# Patient Record
Sex: Female | Born: 1935 | Race: White | Hispanic: No | Smoking: Never smoker
Health system: Southern US, Community
[De-identification: ages and names within clinical notes are randomized; demographics above are authoritative.]

## PROBLEM LIST (undated history)

## (undated) DIAGNOSIS — I442 Atrioventricular block, complete: Secondary | ICD-10-CM

## (undated) DIAGNOSIS — N390 Urinary tract infection, site not specified: Secondary | ICD-10-CM

## (undated) DIAGNOSIS — I2510 Atherosclerotic heart disease of native coronary artery without angina pectoris: Secondary | ICD-10-CM

## (undated) DIAGNOSIS — E0590 Thyrotoxicosis, unspecified without thyrotoxic crisis or storm: Secondary | ICD-10-CM

## (undated) DIAGNOSIS — B029 Zoster without complications: Secondary | ICD-10-CM

## (undated) DIAGNOSIS — C569 Malignant neoplasm of unspecified ovary: Secondary | ICD-10-CM

## (undated) DIAGNOSIS — N189 Chronic kidney disease, unspecified: Secondary | ICD-10-CM

## (undated) DIAGNOSIS — E039 Hypothyroidism, unspecified: Secondary | ICD-10-CM

## (undated) HISTORY — PX: BLADDER REPAIR: SHX76

## (undated) HISTORY — PX: PARTIAL HYSTERECTOMY: SHX80

## (undated) HISTORY — PX: OTHER SURGICAL HISTORY: SHX169

## (undated) HISTORY — PX: ABDOMINAL HYSTERECTOMY: SHX81

## (undated) HISTORY — DX: Urinary tract infection, site not specified: N39.0

## (undated) HISTORY — DX: Chronic kidney disease, unspecified: N18.9

## (undated) HISTORY — DX: Hypothyroidism, unspecified: E03.9

## (undated) HISTORY — DX: Zoster without complications: B02.9

## (undated) HISTORY — DX: Atherosclerotic heart disease of native coronary artery without angina pectoris: I25.10

---

## 1999-02-20 HISTORY — PX: CORONARY ANGIOPLASTY WITH STENT PLACEMENT: SHX49

## 2012-02-20 DIAGNOSIS — C569 Malignant neoplasm of unspecified ovary: Secondary | ICD-10-CM

## 2012-02-20 HISTORY — DX: Malignant neoplasm of unspecified ovary: C56.9

## 2015-10-09 ENCOUNTER — Encounter: Payer: Self-pay | Admitting: Emergency Medicine

## 2015-10-09 ENCOUNTER — Observation Stay
Admission: EM | Admit: 2015-10-09 | Discharge: 2015-10-13 | Disposition: A | Discharge status: Home / Self Care | Payer: Commercial Managed Care - HMO | Attending: Internal Medicine | Admitting: Internal Medicine

## 2015-10-09 DIAGNOSIS — Z8543 Personal history of malignant neoplasm of ovary: Secondary | ICD-10-CM | POA: Diagnosis not present

## 2015-10-09 DIAGNOSIS — R001 Bradycardia, unspecified: Secondary | ICD-10-CM | POA: Diagnosis not present

## 2015-10-09 DIAGNOSIS — E875 Hyperkalemia: Secondary | ICD-10-CM | POA: Diagnosis not present

## 2015-10-09 DIAGNOSIS — E785 Hyperlipidemia, unspecified: Secondary | ICD-10-CM | POA: Insufficient documentation

## 2015-10-09 DIAGNOSIS — Z880 Allergy status to penicillin: Secondary | ICD-10-CM | POA: Diagnosis not present

## 2015-10-09 DIAGNOSIS — E0590 Thyrotoxicosis, unspecified without thyrotoxic crisis or storm: Secondary | ICD-10-CM | POA: Diagnosis not present

## 2015-10-09 DIAGNOSIS — Z833 Family history of diabetes mellitus: Secondary | ICD-10-CM | POA: Insufficient documentation

## 2015-10-09 DIAGNOSIS — Z950 Presence of cardiac pacemaker: Secondary | ICD-10-CM

## 2015-10-09 DIAGNOSIS — R401 Stupor: Secondary | ICD-10-CM | POA: Diagnosis not present

## 2015-10-09 DIAGNOSIS — I4589 Other specified conduction disorders: Secondary | ICD-10-CM | POA: Insufficient documentation

## 2015-10-09 DIAGNOSIS — I442 Atrioventricular block, complete: Principal | ICD-10-CM | POA: Diagnosis present

## 2015-10-09 DIAGNOSIS — Z87892 Personal history of anaphylaxis: Secondary | ICD-10-CM | POA: Insufficient documentation

## 2015-10-09 DIAGNOSIS — R55 Syncope and collapse: Secondary | ICD-10-CM

## 2015-10-09 DIAGNOSIS — E119 Type 2 diabetes mellitus without complications: Secondary | ICD-10-CM | POA: Insufficient documentation

## 2015-10-09 DIAGNOSIS — E039 Hypothyroidism, unspecified: Secondary | ICD-10-CM | POA: Diagnosis not present

## 2015-10-09 DIAGNOSIS — Z7982 Long term (current) use of aspirin: Secondary | ICD-10-CM | POA: Insufficient documentation

## 2015-10-09 DIAGNOSIS — Z888 Allergy status to other drugs, medicaments and biological substances status: Secondary | ICD-10-CM | POA: Insufficient documentation

## 2015-10-09 DIAGNOSIS — Z85038 Personal history of other malignant neoplasm of large intestine: Secondary | ICD-10-CM | POA: Diagnosis not present

## 2015-10-09 HISTORY — DX: Atrioventricular block, complete: I44.2

## 2015-10-09 HISTORY — DX: Thyrotoxicosis, unspecified without thyrotoxic crisis or storm: E05.90

## 2015-10-09 HISTORY — DX: Malignant neoplasm of unspecified ovary: C56.9

## 2015-10-09 LAB — CBC
HEMATOCRIT: 40.3 % (ref 35.0–47.0)
Hemoglobin: 13.6 g/dL (ref 12.0–16.0)
MCH: 31.3 pg (ref 26.0–34.0)
MCHC: 33.7 g/dL (ref 32.0–36.0)
MCV: 92.8 fL (ref 80.0–100.0)
Platelets: 207 10*3/uL (ref 150–440)
RBC: 4.34 MIL/uL (ref 3.80–5.20)
RDW: 13.6 % (ref 11.5–14.5)
WBC: 7.9 10*3/uL (ref 3.6–11.0)

## 2015-10-09 LAB — COMPREHENSIVE METABOLIC PANEL
ALK PHOS: 68 U/L (ref 38–126)
ALT: 51 U/L (ref 14–54)
AST: 41 U/L (ref 15–41)
Albumin: 3.7 g/dL (ref 3.5–5.0)
Anion gap: 5 (ref 5–15)
BUN: 18 mg/dL (ref 6–20)
CALCIUM: 9.1 mg/dL (ref 8.9–10.3)
CO2: 30 mmol/L (ref 22–32)
CREATININE: 0.82 mg/dL (ref 0.44–1.00)
Chloride: 107 mmol/L (ref 101–111)
Glucose, Bld: 110 mg/dL — ABNORMAL HIGH (ref 65–99)
Potassium: 5.2 mmol/L — ABNORMAL HIGH (ref 3.5–5.1)
Sodium: 142 mmol/L (ref 135–145)
Total Bilirubin: 0.8 mg/dL (ref 0.3–1.2)
Total Protein: 7.1 g/dL (ref 6.5–8.1)

## 2015-10-09 LAB — TSH: TSH: 0.324 u[IU]/mL — AB (ref 0.350–4.500)

## 2015-10-09 LAB — TROPONIN I

## 2015-10-09 LAB — MAGNESIUM: Magnesium: 2 mg/dL (ref 1.7–2.4)

## 2015-10-09 MED ORDER — SODIUM POLYSTYRENE SULFONATE 15 GM/60ML PO SUSP
ORAL | Status: AC
  Administered 2015-10-09: 30 g via ORAL
  Filled 2015-10-09: qty 60

## 2015-10-09 MED ORDER — HEPARIN SODIUM (PORCINE) 5000 UNIT/ML IJ SOLN
5000.0000 [IU] | Freq: Three times a day (TID) | INTRAMUSCULAR | Status: AC
  Administered 2015-10-09 – 2015-10-10 (×4): 5000 [IU] via SUBCUTANEOUS
  Filled 2015-10-09 (×3): qty 1

## 2015-10-09 MED ORDER — LEVOTHYROXINE SODIUM 88 MCG PO TABS
88.0000 ug | ORAL_TABLET | Freq: Every day | ORAL | Status: DC
  Administered 2015-10-09 – 2015-10-12 (×4): 88 ug via ORAL
  Filled 2015-10-09 (×4): qty 1

## 2015-10-09 MED ORDER — ADULT MULTIVITAMIN W/MINERALS CH
1.0000 | ORAL_TABLET | Freq: Every evening | ORAL | Status: DC
  Administered 2015-10-10 – 2015-10-12 (×3): 1 via ORAL
  Filled 2015-10-09 (×4): qty 1

## 2015-10-09 MED ORDER — SODIUM POLYSTYRENE SULFONATE 15 GM/60ML PO SUSP
30.0000 g | Freq: Once | ORAL | Status: AC
  Administered 2015-10-09: 30 g via ORAL
  Filled 2015-10-09: qty 120

## 2015-10-09 MED ORDER — SODIUM CHLORIDE 0.9% FLUSH
3.0000 mL | Freq: Two times a day (BID) | INTRAVENOUS | Status: DC
  Administered 2015-10-09 – 2015-10-13 (×8): 3 mL via INTRAVENOUS

## 2015-10-09 MED ORDER — ASPIRIN EC 81 MG PO TBEC
81.0000 mg | DELAYED_RELEASE_TABLET | Freq: Every day | ORAL | Status: DC
  Administered 2015-10-09 – 2015-10-12 (×4): 81 mg via ORAL
  Filled 2015-10-09 (×4): qty 1

## 2015-10-09 MED ORDER — ATORVASTATIN CALCIUM 10 MG PO TABS
10.0000 mg | ORAL_TABLET | Freq: Every day | ORAL | Status: DC
  Administered 2015-10-09 – 2015-10-12 (×4): 10 mg via ORAL
  Filled 2015-10-09 (×4): qty 1

## 2015-10-09 NOTE — ED Notes (Signed)
Admitting MD at bedside.

## 2015-10-09 NOTE — H&P (Signed)
[facility] [place] NAME: Natalie Stone    MR#:  [id number]  DATE OF BIRTH:  09-11-1926  DATE OF ADMISSION:  10/09/2015  PRIMARY CARE PHYSICIAN: Rusty Aus, MD   REQUESTING/REFERRING PHYSICIAN: [email or web address]  CHIEF COMPLAINT:   Chief Complaint  Patient presents with  . Bradycardia  . Near Syncope    HISTORY OF PRESENT ILLNESS: Natalie Stone  is a 82 y.o. female with a known history of Colon cancer history, hypothyroidism- was having some cramps in her legs and so started taking potassium supplements over-the-counter for last few days. Last 2-3 days she started feeling more weak and somewhat dizzy so decided to come to emergency room today. In ER she was noted to have bradycardia and third-degree AV block, so started on cardiac monitor and pacer as standby, and cardiologist made aware by ER physician about her admission. Currently during my exam she is in normal sinus rhythm with heart rate of 86.  PAST MEDICAL HISTORY:   Past Medical History:  Diagnosis Date  . Hyperthyroidism   . Ovarian cancer ([place]) 2014  . Third degree AV block ([place]) 10/09/2015    PAST SURGICAL HISTORY:  Past Surgical History:  Procedure Laterality Date  . ABDOMINAL HYSTERECTOMY    . BLADDER REPAIR    . PARTIAL HYSTERECTOMY      SOCIAL HISTORY:  Social History  Substance Use Topics  . Smoking status: Never Smoker  . Smokeless tobacco: Never Used  . Alcohol use No    FAMILY HISTORY:  Family History  Problem Relation Age of Onset  . Diabetes Brother     DRUG ALLERGIES:  Allergies  Allergen Reactions  . Penicillins Anaphylaxis    Has patient had a PCN reaction causing immediate rash, facial/tongue/throat swelling, SOB or lightheadedness with hypotension: Yes Has patient had a PCN reaction causing severe rash involving mucus membranes or skin necrosis: No Has patient had a PCN reaction that required hospitalization No Has patient had a PCN reaction  occurring within the last 10 years: No If all of the above answers are "NO", then may proceed with Cephalosporin use.    REVIEW OF SYSTEMS:   CONSTITUTIONAL: No fever,Positive for fatigue or weakness.  EYES: No blurred or double vision.  EARS, NOSE, AND THROAT: No tinnitus or ear pain.  RESPIRATORY: No cough, shortness of breath, wheezing or hemoptysis.  CARDIOVASCULAR: No chest pain, orthopnea, edema.  GASTROINTESTINAL: No nausea, vomiting, diarrhea or abdominal pain.  GENITOURINARY: No dysuria, hematuria.  ENDOCRINE: No polyuria, nocturia,  HEMATOLOGY: No anemia, easy bruising or bleeding SKIN: No rash or lesion. MUSCULOSKELETAL: No joint pain or arthritis.   NEUROLOGIC: No tingling, numbness, weakness.  PSYCHIATRY: No anxiety or depression.   MEDICATIONS AT HOME:  Prior to Admission medications   Medication Sig Start Date End Date Taking? Authorizing Provider  aspirin EC 81 MG tablet Take 81 mg by mouth at bedtime.   Yes Historical Provider, MD  atorvastatin (LIPITOR) 20 MG tablet Take 10 mg by mouth at bedtime.   Yes Historical Provider, MD  glucosamine-chondroitin 500-400 MG tablet Take 1 tablet by mouth 3 (three) times daily.   Yes Historical Provider, MD  levothyroxine (SYNTHROID, LEVOTHROID) 88 MCG tablet Take 88 mcg by mouth at bedtime.    Yes Historical Provider, MD  Multiple Vitamin (MULTIVITAMIN) tablet Take 1 tablet by mouth every evening.   Yes Historical Provider, MD      PHYSICAL EXAMINATION:   VITAL SIGNS: Blood pressure Marland Kitchen)  148/51, pulse (!) 41, temperature 98.5 F (36.9 C), temperature source Oral, resp. rate (!) 21, height 5\' 2"  (1.575 m), weight 54.4 kg (120 lb), SpO2 98 %.  GENERAL:  82 y.o.-year-old patient lying in the bed with no acute distress.  EYES: Pupils equal, round, reactive to light and accommodation. No scleral icterus. Extraocular muscles intact.  HEENT: Head atraumatic, normocephalic. Oropharynx and nasopharynx clear.  NECK:  Supple, no  jugular venous distention. No thyroid enlargement, no tenderness.  LUNGS: Normal breath sounds bilaterally, no wheezing, rales,rhonchi or crepitation. No use of accessory muscles of respiration.  CARDIOVASCULAR: S1, S2 normal. No murmurs, rubs, or gallops.  ABDOMEN: Soft, nontender, nondistended. Bowel sounds present. No organomegaly or mass.  EXTREMITIES: No pedal edema, cyanosis, or clubbing.  NEUROLOGIC: Cranial nerves II through XII are intact. Muscle strength 5/5 in all extremities. Sensation intact. Gait not checked.  PSYCHIATRIC: The patient is alert and oriented x 3.  SKIN: No obvious rash, lesion, or ulcer.   LABORATORY PANEL:   CBC  Recent Labs Lab 10/09/15 1935  WBC 7.9  HGB 13.6  HCT 40.3  PLT 207  MCV 92.8  MCH 31.3  MCHC 33.7  RDW 13.6   ------------------------------------------------------------------------------------------------------------------  Chemistries   Recent Labs Lab 10/09/15 1935  NA 142  K 5.2*  CL 107  CO2 30  GLUCOSE 110*  BUN 18  CREATININE 0.82  CALCIUM 9.1  AST 41  ALT 51  ALKPHOS 68  BILITOT 0.8   ------------------------------------------------------------------------------------------------------------------ estimated creatinine clearance is 38.9 mL/min (by C-G formula based on SCr of 0.82 mg/dL). ------------------------------------------------------------------------------------------------------------------ No results for input(s): TSH, T4TOTAL, T3FREE, THYROIDAB in the last 72 hours.  Invalid input(s): FREET3   Coagulation profile No results for input(s): INR, PROTIME in the last 168 hours. ------------------------------------------------------------------------------------------------------------------- No results for input(s): DDIMER in the last 72 hours. -------------------------------------------------------------------------------------------------------------------  Cardiac Enzymes  Recent Labs Lab  10/09/15 1935  TROPONINI <0.03   ------------------------------------------------------------------------------------------------------------------ Invalid input(s): POCBNP  ---------------------------------------------------------------------------------------------------------------  Urinalysis No results found for: COLORURINE, APPEARANCEUR, LABSPEC, PHURINE, GLUCOSEU, HGBUR, BILIRUBINUR, KETONESUR, PROTEINUR, UROBILINOGEN, NITRITE, LEUKOCYTESUR   RADIOLOGY: No results found.  EKG: Orders placed or performed during the hospital encounter of 10/09/15  . EKG 12-Lead  . EKG 12-Lead  12-lead EKG in ER shows complete third degree AV block.  IMPRESSION AND PLAN: * Third-degree AV block with bradycardia and symptoms of dizziness   Potassium level is slightly high, giving 1 dose of Kayexalate, checked tomorrow morning.   Counseled her not to take potassium supplement without seeking advice from her primary care physician.    She is on thyroid supplement last time TSH was checked in March 2017, check TSH here.  * Hypothyroidism   Continue Synthroid supplement for now, check TSH.  * Hyperlipidemia   Continue atorvastatin.  All the records are reviewed and case discussed with ED provider. Management plans discussed with the patient, family and they are in agreement.  CODE STATUS: Full code Code Status History    This patient does not have a recorded code status. Please follow your organizational policy for patients in this situation.    Advance Directive Documentation   Flowsheet Row Most Recent Value  Type of Advance Directive  Healthcare Power of Attorney, Living will, Out of facility DNR (pink MOST or yellow form)  Pre-existing out of facility DNR order (yellow form or pink MOST form)  No data  "MOST" Form in Place?  No data      Patient's husband and granddaughter are present in  the room during my visit. TOTAL TIME TAKING CARE OF THIS PATIENT: 50 minutes.    Vaughan Basta M.D on 10/09/2015   Between 7am to 6pm - Pager - [phone]  After 6pm go to [email or web address] - password [email or web address] [facility]  Office  [phone]  CC: Primary care physician; Rusty Aus, MD   Note: This dictation was prepared with Dragon dictation along with smaller phrase technology. Any transcriptional errors that result from this process are unintentional.

## 2015-10-09 NOTE — Care Management Obs Status (Signed)
[place] NOTIFICATION   Patient Details  Name: Natalie Stone MRN: [id number] Date of Birth: 1927-02-02   Medicare Observation Status Notification Given:   Yes    Beau Fanny, RN 10/09/2015, 9:09 PM

## 2015-10-09 NOTE — ED Triage Notes (Signed)
PER [facility]: Pt. Started feeling weak 2 days ago, MD at assisted living assessed and advised pt. To intake more PO fluids. Today pt. Reports near syncope so called 911. EMS reports 2nd degree, type 2 heart block with a heart rate averaging about 43 bpm with some PVCs.

## 2015-10-09 NOTE — ED Provider Notes (Signed)
[facility] Emergency Department Provider Note  Time seen: 6:41 PM  I have reviewed the triage vital signs and the nursing notes.   HISTORY  Chief Complaint Bradycardia and Near Syncope    HPI Natalie Stone is a 82 y.o. female with a past medical history of hyperlipidemia and hypothyroidism who presents the emergency department with dizziness. According to the patient for the past one week she has been feeling dizzy and lightheaded at times. States she is almost passed out multiple times. Saw her doctor 2 days ago and was diagnosed with dehydration. States today she was very dizzy and lightheaded so she called EMS who brought her to the emergency department. Patient denies any chest pain, trouble breathing or nausea. States the lightheadedness will occasionally happen while lying down, or while walking. Patient noted to be bradycardic rhythm 40 bpm. On arrival to the emergency department.  Past Medical History:  Diagnosis Date  . Ovarian cancer ([place]) 2014    There are no active problems to display for this patient.   Past Surgical History:  Procedure Laterality Date  . ABDOMINAL HYSTERECTOMY    . BLADDER REPAIR    . PARTIAL HYSTERECTOMY      Prior to Admission medications   Medication Sig Start Date End Date Taking? Authorizing Provider  atorvastatin (LIPITOR) 10 MG tablet Take 10 mg by mouth daily.   Yes Historical Provider, MD  levothyroxine (SYNTHROID, LEVOTHROID) 88 MCG tablet Take 88 mcg by mouth daily before breakfast.   Yes Historical Provider, MD    Allergies  Allergen Reactions  . Penicillins Anaphylaxis    History reviewed. No pertinent family history.  Social History Social History  Substance Use Topics  . Smoking status: Never Smoker  . Smokeless tobacco: Never Used  . Alcohol use No    Review of Systems Constitutional: Negative for fever.Intermittent lightheadedness. Cardiovascular: Negative for chest pain. Respiratory: Negative  for shortness of breath. Gastrointestinal: Negative for abdominal pain Genitourinary: Negative for dysuria. Neurological: Negative for headache 10-point ROS otherwise negative.  ____________________________________________   PHYSICAL EXAM:  VITAL SIGNS: ED Triage Vitals  Enc Vitals Group     BP 10/09/15 1822 (!) 155/44     Pulse Rate 10/09/15 1822 (!) 41     Resp 10/09/15 1822 20     Temp 10/09/15 1822 98.5 F (36.9 C)     Temp Source 10/09/15 1822 Oral     SpO2 10/09/15 1822 99 %     Weight 10/09/15 1824 120 lb (54.4 kg)     Height 10/09/15 1824 5\' 2"  (1.575 m)     Head Circumference --      Peak Flow --      Pain Score --      Pain Loc --      Pain Edu? --      Excl. in [place]? --     Constitutional: Alert and oriented. Well appearing and in no distress. Eyes: Normal exam ENT   Head: Normocephalic and atraumatic.   Mouth/Throat: Mucous membranes are moist. Cardiovascular: Bradycardic rate, regular rhythm Respiratory: Normal respiratory effort without tachypnea nor retractions. Breath sounds are clear  Gastrointestinal: Soft and nontender. No distention.  Musculoskeletal: Nontender with normal range of motion in all extremities.  Neurologic:  Normal speech and language. No gross focal neurologic deficits  Skin:  Skin is warm, dry and intact.  Psychiatric: Mood and affect are normal. Speech and behavior are normal.   ____________________________________________    EKG  EKG reviewed  and interpreted by myself shows what appears to be consistent with complete heart block at 48 bpm, complete disassociation between P waves and QRS complexes. No concerning ST changes.  ____________________________________________   INITIAL IMPRESSION / ASSESSMENT AND PLAN / ED COURSE  Pertinent labs & imaging results that were available during my care of the patient were reviewed by me and considered in my medical decision making (see chart for details).  The patient presents  the emergency department intermittent lightheadedness, near syncope. Patient is quite bradycardic around 40 bpm, occasionally dropping to the upper 30s. Asymptomatic at this time with a stable/normal blood pressure. We will check an EKG, patient will likely require admission to the hospital versus transfer for further workup and cardiology evaluation. Patient placed on Zoll monitor.  I discussed the patient with Dr.Khan, he plans to place a permanent pacemaker tomorrow. As the patient is stable with normal blood pressure and a symptomatically at this time will admit to the hospitalist with cardiology consultation in the morning for likely pacemaker placement tomorrow.   CRITICAL CARE Performed by: Harvest Dark   Total critical care time: 30 minutes  Critical care time was exclusive of separately billable procedures and treating other patients.  Critical care was necessary to treat or prevent imminent or life-threatening deterioration.  Critical care was time spent personally by me on the following activities: development of treatment plan with patient and/or surrogate as well as nursing, discussions with consultants, evaluation of patient's response to treatment, examination of patient, obtaining history from patient or surrogate, ordering and performing treatments and interventions, ordering and review of laboratory studies, ordering and review of radiographic studies, pulse oximetry and re-evaluation of patient's condition.   ____________________________________________   FINAL CLINICAL IMPRESSION(S) / ED DIAGNOSES  Near-syncope  Bradycardia Complete heart block   Harvest Dark, MD 10/09/15 2019

## 2015-10-10 DIAGNOSIS — I455 Other specified heart block: Secondary | ICD-10-CM | POA: Diagnosis not present

## 2015-10-10 DIAGNOSIS — E875 Hyperkalemia: Secondary | ICD-10-CM | POA: Diagnosis not present

## 2015-10-10 DIAGNOSIS — E039 Hypothyroidism, unspecified: Secondary | ICD-10-CM | POA: Diagnosis not present

## 2015-10-10 DIAGNOSIS — R001 Bradycardia, unspecified: Secondary | ICD-10-CM | POA: Diagnosis not present

## 2015-10-10 DIAGNOSIS — I442 Atrioventricular block, complete: Secondary | ICD-10-CM | POA: Diagnosis not present

## 2015-10-10 LAB — BASIC METABOLIC PANEL
Anion gap: 5 (ref 5–15)
BUN: 14 mg/dL (ref 6–20)
CALCIUM: 8.5 mg/dL — AB (ref 8.9–10.3)
CHLORIDE: 108 mmol/L (ref 101–111)
CO2: 30 mmol/L (ref 22–32)
CREATININE: 0.49 mg/dL (ref 0.44–1.00)
Glucose, Bld: 95 mg/dL (ref 65–99)
Potassium: 3.6 mmol/L (ref 3.5–5.1)
SODIUM: 143 mmol/L (ref 135–145)

## 2015-10-10 LAB — CBC
HCT: 36.6 % (ref 35.0–47.0)
Hemoglobin: 12.6 g/dL (ref 12.0–16.0)
MCH: 31.6 pg (ref 26.0–34.0)
MCHC: 34.4 g/dL (ref 32.0–36.0)
MCV: 91.8 fL (ref 80.0–100.0)
PLATELETS: 189 10*3/uL (ref 150–440)
RBC: 3.99 MIL/uL (ref 3.80–5.20)
RDW: 13.7 % (ref 11.5–14.5)
WBC: 5.8 10*3/uL (ref 3.6–11.0)

## 2015-10-10 LAB — GLUCOSE, CAPILLARY: GLUCOSE-CAPILLARY: 165 mg/dL — AB (ref 65–99)

## 2015-10-10 LAB — MRSA PCR SCREENING: MRSA by PCR: NEGATIVE

## 2015-10-10 MED ORDER — HEPARIN SODIUM (PORCINE) 5000 UNIT/ML IJ SOLN
5000.0000 [IU] | Freq: Three times a day (TID) | INTRAMUSCULAR | Status: DC
  Administered 2015-10-11 – 2015-10-12 (×3): 5000 [IU] via SUBCUTANEOUS
  Filled 2015-10-10 (×3): qty 1

## 2015-10-10 NOTE — Plan of Care (Signed)
Problem: Safety: Goal: Ability to remain free from injury will improve Outcome: Progressing Fall precautions in place  Problem: Pain Managment: Goal: General experience of comfort will improve Outcome: Progressing Prn medications  Problem: Tissue Perfusion: Goal: Risk factors for ineffective tissue perfusion will decrease Outcome: Progressing SQ Heparin

## 2015-10-10 NOTE — Care Management Obs Status (Signed)
[place] NOTIFICATION   Patient Details  Name: Natalie Stone MRN: [id number] Date of Birth: [birth year]   Medicare Observation Status Notification Given:  Yes    Jolly Mango, RN 10/10/2015, 9:46 AM

## 2015-10-10 NOTE — Progress Notes (Signed)
Natalie Stone is a 82 y.o. female  [id number]  Primary Cardiologist: Neoma Laming Reason for Consultation: Third degree AV block  HPI: This 82 year old white female with a past medical history of diabetes and hyperlipidemia presented to the hospital with dizziness and presyncope. She did not have full-blown passing out spell. She was found to have third-degree AV block with A-V dissociation thus I was asked to evaluate the patient.   Review of Systems: No orthopnea PND or leg swelling   Past Medical History:  Diagnosis Date  . Hyperthyroidism   . Ovarian cancer ([place]) 2014  . Third degree AV block ([place]) 10/09/2015    Medications Prior to Admission  Medication Sig Dispense Refill  . aspirin EC 81 MG tablet Take 81 mg by mouth at bedtime.    Marland Kitchen atorvastatin (LIPITOR) 20 MG tablet Take 10 mg by mouth at bedtime.    Marland Kitchen glucosamine-chondroitin 500-400 MG tablet Take 1 tablet by mouth 3 (three) times daily.    Marland Kitchen levothyroxine (SYNTHROID, LEVOTHROID) 88 MCG tablet Take 88 mcg by mouth at bedtime.     . Multiple Vitamin (MULTIVITAMIN) tablet Take 1 tablet by mouth every evening.       Marland Kitchen aspirin EC  81 mg Oral QHS  . atorvastatin  10 mg Oral QHS  . heparin  5,000 Units Subcutaneous Q8H  . levothyroxine  88 mcg Oral QHS  . multivitamin with minerals  1 tablet Oral QPM  . sodium chloride flush  3 mL Intravenous Q12H    Infusions:    Allergies  Allergen Reactions  . Penicillins Anaphylaxis    Has patient had a PCN reaction causing immediate rash, facial/tongue/throat swelling, SOB or lightheadedness with hypotension: Yes Has patient had a PCN reaction causing severe rash involving mucus membranes or skin necrosis: No Has patient had a PCN reaction that required hospitalization No Has patient had a PCN reaction occurring within the last 10 years: No If all of the above answers are "NO", then may proceed with Cephalosporin use.    Social History   Social History  . Marital  status: Married    Spouse name: N/A  . Number of children: N/A  . Years of education: N/A   Occupational History  . Not on file.   Social History Main Topics  . Smoking status: Never Smoker  . Smokeless tobacco: Never Used  . Alcohol use No  . Drug use: No  . Sexual activity: Not on file   Other Topics Concern  . Not on file   Social History Narrative  . No narrative on file    Family History  Problem Relation Age of Onset  . Diabetes Brother     PHYSICAL EXAM: Vitals:   10/10/15 0543 10/10/15 0750  BP: (!) 125/52 (!) 149/51  Pulse: 70 72  Resp: 18   Temp: 98.7 F (37.1 C) 97.7 F (36.5 C)     Intake/Output Summary (Last 24 hours) at 10/10/15 0751 Last data filed at 10/10/15 0543  Gross per 24 hour  Intake                0 ml  Output              600 ml  Net             -600 ml    General:  Well appearing. No respiratory difficulty HEENT: normal Neck: supple. no JVD. Carotids 2+ bilat; no bruits. No lymphadenopathy or thryomegaly appreciated. Cor:  PMI nondisplaced. Regular rate & rhythm. No rubs, gallops or murmurs. Lungs: clear Abdomen: soft, nontender, nondistended. No hepatosplenomegaly. No bruits or masses. Good bowel sounds. Extremities: no cyanosis, clubbing, rash, edema Neuro: alert & oriented x 3, cranial nerves grossly intact. moves all 4 extremities w/o difficulty. Affect pleasant.  [id number] rhythm with A-V dissociation and ventricular rate 48 suggestive of third degree AV block  Results for orders placed or performed during the hospital encounter of 10/09/15 (from the past 24 hour(s))  CBC     Status: None   Collection Time: 10/09/15  7:35 PM  Result Value Ref Range   WBC 7.9 3.6 - 11.0 K/uL   RBC 4.34 3.80 - 5.20 MIL/uL   Hemoglobin 13.6 12.0 - 16.0 g/dL   HCT 40.3 35.0 - 47.0 %   MCV 92.8 80.0 - 100.0 fL   MCH 31.3 26.0 - 34.0 pg   MCHC 33.7 32.0 - 36.0 g/dL   RDW 13.6 11.5 - 14.5 %   Platelets 207 150 - 440 K/uL  Comprehensive  metabolic panel     Status: Abnormal   Collection Time: 10/09/15  7:35 PM  Result Value Ref Range   Sodium 142 135 - 145 mmol/L   Potassium 5.2 (H) 3.5 - 5.1 mmol/L   Chloride 107 101 - 111 mmol/L   CO2 30 22 - 32 mmol/L   Glucose, Bld 110 (H) 65 - 99 mg/dL   BUN 18 6 - 20 mg/dL   Creatinine, Ser 0.82 0.44 - 1.00 mg/dL   Calcium 9.1 8.9 - 10.3 mg/dL   Total Protein 7.1 6.5 - 8.1 g/dL   Albumin 3.7 3.5 - 5.0 g/dL   AST 41 15 - 41 U/L   ALT 51 14 - 54 U/L   Alkaline Phosphatase 68 38 - 126 U/L   Total Bilirubin 0.8 0.3 - 1.2 mg/dL   GFR calc non Af Amer >60 >60 mL/min   GFR calc Af Amer >60 >60 mL/min   Anion gap 5 5 - 15  Troponin I     Status: None   Collection Time: 10/09/15  7:35 PM  Result Value Ref Range   Troponin I <0.03 <0.03 ng/mL  TSH     Status: Abnormal   Collection Time: 10/09/15  7:35 PM  Result Value Ref Range   TSH 0.324 (L) 0.350 - 4.500 uIU/mL  Magnesium     Status: None   Collection Time: 10/09/15  7:35 PM  Result Value Ref Range   Magnesium 2.0 1.7 - 2.4 mg/dL  MRSA PCR Screening     Status: None   Collection Time: 10/09/15 11:22 PM  Result Value Ref Range   MRSA by PCR NEGATIVE NEGATIVE  Basic metabolic panel     Status: Abnormal   Collection Time: 10/10/15  5:43 AM  Result Value Ref Range   Sodium 143 135 - 145 mmol/L   Potassium 3.6 3.5 - 5.1 mmol/L   Chloride 108 101 - 111 mmol/L   CO2 30 22 - 32 mmol/L   Glucose, Bld 95 65 - 99 mg/dL   BUN 14 6 - 20 mg/dL   Creatinine, Ser 0.49 0.44 - 1.00 mg/dL   Calcium 8.5 (L) 8.9 - 10.3 mg/dL   GFR calc non Af Amer >60 >60 mL/min   GFR calc Af Amer >60 >60 mL/min   Anion gap 5 5 - 15  CBC     Status: None   Collection Time: 10/10/15  5:43 AM  Result Value  Ref Range   WBC 5.8 3.6 - 11.0 K/uL   RBC 3.99 3.80 - 5.20 MIL/uL   Hemoglobin 12.6 12.0 - 16.0 g/dL   HCT 36.6 35.0 - 47.0 %   MCV 91.8 80.0 - 100.0 fL   MCH 31.6 26.0 - 34.0 pg   MCHC 34.4 32.0 - 36.0 g/dL   RDW 13.7 11.5 - 14.5 %   Platelets  189 150 - 440 K/uL   No results found.   ASSESSMENT AND PLAN:Complete AV block with A-V dissociation and presyncope but clinically stable and hemodynamically stable with no chest pain or shortness of breath or dizziness lying down. No need for temporary pacemaker in. I spoke to Dr. Maris Berger and he has agreed to put a permanent pacemaker in.  KHAN,SHAUKAT A

## 2015-10-10 NOTE — Progress Notes (Signed)
Central tele called this RN to inform of patient's episodes of ventricular standstill. Episodes of over 3 second pauses. Dr. Estanislado Pandy notified.

## 2015-10-10 NOTE — Progress Notes (Signed)
Pt had 4.5 sec and 7.09 sec pause, pt asymptomatic at this time.  Dr. Humphrey Rolls made aware, asked me to page Dr. Josefa Half.

## 2015-10-10 NOTE — Progress Notes (Signed)
Dr. Saralyn Pilar made aware of pauses, no new orders received.

## 2015-10-10 NOTE — Progress Notes (Signed)
[place] [facility] [place] NAME: Natalie Stone    MR#:  [id number]  DATE OF BIRTH:  1926-03-20  SUBJECTIVE:    REVIEW OF SYSTEMS:   ROS Tolerating Diet: Tolerating PT:   DRUG ALLERGIES:   Allergies  Allergen Reactions  . Penicillins Anaphylaxis    Has patient had a PCN reaction causing immediate rash, facial/tongue/throat swelling, SOB or lightheadedness with hypotension: Yes Has patient had a PCN reaction causing severe rash involving mucus membranes or skin necrosis: No Has patient had a PCN reaction that required hospitalization No Has patient had a PCN reaction occurring within the last 10 years: No If all of the above answers are "NO", then may proceed with Cephalosporin use.    VITALS:  Blood pressure (!) 126/40, pulse 69, temperature 98.1 F (36.7 C), temperature source Oral, resp. rate 18, height 5\' 2"  (1.575 m), weight 117 lb 3.2 oz (53.2 kg), SpO2 97 %.  PHYSICAL EXAMINATION:   Physical Exam  GENERAL:  82 y.o.-year-old patient lying in the bed with no acute distress.  EYES: Pupils equal, round, reactive to light and accommodation. No scleral icterus. Extraocular muscles intact.  HEENT: Head atraumatic, normocephalic. Oropharynx and nasopharynx clear.  NECK:  Supple, no jugular venous distention. No thyroid enlargement, no tenderness.  LUNGS: Normal breath sounds bilaterally, no wheezing, rales, rhonchi. No use of accessory muscles of respiration.  CARDIOVASCULAR: S1, S2 normal. No murmurs, rubs, or gallops.  ABDOMEN: Soft, nontender, nondistended. Bowel sounds present. No organomegaly or mass.  EXTREMITIES: No cyanosis, clubbing or edema b/l.    NEUROLOGIC: Cranial nerves II through XII are intact. No focal Motor or sensory deficits b/l.   PSYCHIATRIC:  patient is alert and oriented x 3.  SKIN: No obvious rash, lesion, or ulcer.   LABORATORY PANEL:  CBC  Recent Labs Lab 10/10/15 0543  WBC 5.8  HGB 12.6  HCT 36.6   PLT 189    Chemistries   Recent Labs Lab 10/09/15 1935 10/10/15 0543  NA 142 143  K 5.2* 3.6  CL 107 108  CO2 30 30  GLUCOSE 110* 95  BUN 18 14  CREATININE 0.82 0.49  CALCIUM 9.1 8.5*  MG 2.0  --   AST 41  --   ALT 51  --   ALKPHOS 68  --   BILITOT 0.8  --    Cardiac Enzymes  Recent Labs Lab 10/09/15 1935  TROPONINI <0.03   RADIOLOGY:  No results found. ASSESSMENT AND PLAN:  Natalie Stone  is a 82 y.o. female with a known history of Colon cancer history, hypothyroidism- was having some cramps in her legs and so started taking potassium supplements over-the-counter for last few days. In ER she was noted to have bradycardia and third-degree AV block, so started on cardiac monitor and pacer as standby  * Third-degree AV block with bradycardia and symptoms of dizziness -pt is asymptomatic  -Pacemaker implant tomorrow by dr Josefa Half  * Hypothyroidism   Continue Synthroid supplement   * Hyperlipidemia   Continue atorvastatin.  Case discussed with Care Management/Social Worker. Management plans discussed with the patient, family and they are in agreement.  CODE STATUS:FULL DVT Prophylaxis: Lovenox TOTAL TIME TAKING CARE OF THIS PATIENT:30 minutes.  >50% time spent on counselling and coordination of care  POSSIBLE D/C IN 1-2 DAYS, DEPENDING ON CLINICAL CONDITION.  Note: This dictation was prepared with Dragon dictation along with smaller phrase technology. Any transcriptional errors that result from this  process are unintentional.  Taniaya Rudder M.D on 10/10/2015 at 2:15 PM  Between 7am to 6pm - Pager - [phone]  After 6pm go to [email or web address] - password [email or web address] [facility]  Office  [phone]  CC: Primary care physician; Rusty Aus, MD

## 2015-10-10 NOTE — Progress Notes (Signed)
A&O. Up with standby assist. Admitted from home with dizziness. Found to be in 3rd degree heart block. Now SR at 45. Pacer pads in place. For possible pacemaker placement today.

## 2015-10-10 NOTE — Progress Notes (Signed)
Has 4 sec pause, asymptomatic thus doesnot need temporary pacemaker and has been like this for weeks. She is having PPM tomorrow.

## 2015-10-10 NOTE — Care Management (Signed)
Observation patient. From home with her spouse. Met with patient. She is hard of hearing.  Admitted with 3rd degree block. For pacemaker 08/22. Current with PCP Dr. Sabra Heck. Denies issues obtaining medications, copays or transportation. No home O2 or home health. No discharge needs anticipated at this time.

## 2015-10-10 NOTE — Progress Notes (Signed)
[place] at [place] NAME: Natalie Stone    MR#:  [id number]  DATE OF BIRTH:  08-19-1926  SUBJECTIVE:   Came in after feeling dizzy and light headed Found to have AV block REVIEW OF SYSTEMS:   Review of Systems  Constitutional: Negative for chills, fever and weight loss.  HENT: Negative for ear discharge, ear pain and nosebleeds.   Eyes: Negative for blurred vision, pain and discharge.  Respiratory: Negative for sputum production, shortness of breath, wheezing and stridor.   Cardiovascular: Negative for chest pain, palpitations, orthopnea and PND.  Gastrointestinal: Negative for abdominal pain, diarrhea, nausea and vomiting.  Genitourinary: Negative for frequency and urgency.  Musculoskeletal: Negative for back pain and joint pain.  Neurological: Positive for dizziness. Negative for sensory change, speech change, focal weakness and weakness.  Psychiatric/Behavioral: Negative for depression and hallucinations. The patient is not nervous/anxious.    Tolerating Diet:yes Tolerating PT: pending  DRUG ALLERGIES:   Allergies  Allergen Reactions  . Penicillins Anaphylaxis    Has patient had a PCN reaction causing immediate rash, facial/tongue/throat swelling, SOB or lightheadedness with hypotension: Yes Has patient had a PCN reaction causing severe rash involving mucus membranes or skin necrosis: No Has patient had a PCN reaction that required hospitalization No Has patient had a PCN reaction occurring within the last 10 years: No If all of the above answers are "NO", then may proceed with Cephalosporin use.    VITALS:  Blood pressure (!) 126/40, pulse 69, temperature 98.1 F (36.7 C), temperature source Oral, resp. rate 18, height 5\' 2"  (1.575 m), weight 117 lb 3.2 oz (53.2 kg), SpO2 97 %.  PHYSICAL EXAMINATION:   Physical Exam  GENERAL:  82 y.o.-year-old patient lying in the bed with no acute distress.  EYES: Pupils equal,  round, reactive to light and accommodation. No scleral icterus. Extraocular muscles intact.  HEENT: Head atraumatic, normocephalic. Oropharynx and nasopharynx clear.  NECK:  Supple, no jugular venous distention. No thyroid enlargement, no tenderness.  LUNGS: Normal breath sounds bilaterally, no wheezing, rales, rhonchi. No use of accessory muscles of respiration.  CARDIOVASCULAR: S1, S2 normal. No murmurs, rubs, or gallops.  ABDOMEN: Soft, nontender, nondistended. Bowel sounds present. No organomegaly or mass.  EXTREMITIES: No cyanosis, clubbing or edema b/l.    NEUROLOGIC: Cranial nerves II through XII are intact. No focal Motor or sensory deficits b/l.   PSYCHIATRIC:  patient is alert and oriented x 3.  SKIN: No obvious rash, lesion, or ulcer.   LABORATORY PANEL:  CBC  Recent Labs Lab 10/10/15 0543  WBC 5.8  HGB 12.6  HCT 36.6  PLT 189    Chemistries   Recent Labs Lab 10/09/15 1935 10/10/15 0543  NA 142 143  K 5.2* 3.6  CL 107 108  CO2 30 30  GLUCOSE 110* 95  BUN 18 14  CREATININE 0.82 0.49  CALCIUM 9.1 8.5*  MG 2.0  --   AST 41  --   ALT 51  --   ALKPHOS 68  --   BILITOT 0.8  --    Cardiac Enzymes  Recent Labs Lab 10/09/15 1935  TROPONINI <0.03   RADIOLOGY:  No results found. ASSESSMENT AND PLAN:  Natalie Stone  is a 82 y.o. female with a known history of Colon cancer history, hypothyroidism- was having some cramps in her legs and so started taking potassium supplements over-the-counter for last few days. In ER she was noted to have bradycardia and third-degree  AV block, so started on cardiac monitor and pacer as standby  * Third-degree AV block with bradycardia and symptoms of dizziness -pt is asymptomatic  -Pacemaker implant tomorrow by dr Josefa Half  * Hypothyroidism   Continue Synthroid supplement   * Hyperlipidemia   Continue atorvastatin.  Case discussed with Care Management/Social Worker. Management plans discussed with the patient, family  and they are in agreement.  CODE STATUS:FULL DVT Prophylaxis: Lovenox TOTAL TIME TAKING CARE OF THIS PATIENT:30 minutes.  >50% time spent on counselling and coordination of care  POSSIBLE D/C IN 1-2 DAYS, DEPENDING ON CLINICAL CONDITION.  Note: This dictation was prepared with Dragon dictation along with smaller phrase technology. Any transcriptional errors that result from this process are unintentional.  Joniel Graumann M.D on 10/10/2015 at 2:17 PM  Between 7am to 6pm - Pager - [phone]  After 6pm go to [email or web address] - password [email or web address] [facility]  Office  [phone]  CC: Primary care physician; Rusty Aus, MD

## 2015-10-11 ENCOUNTER — Observation Stay: Payer: Commercial Managed Care - HMO

## 2015-10-11 ENCOUNTER — Encounter
Admission: EM | Disposition: A | Discharge status: Home / Self Care | Payer: Self-pay | Source: Home / Self Care | Attending: Emergency Medicine

## 2015-10-11 ENCOUNTER — Encounter: Payer: Self-pay | Admitting: *Deleted

## 2015-10-11 ENCOUNTER — Observation Stay: Payer: Commercial Managed Care - HMO | Admitting: Anesthesiology

## 2015-10-11 DIAGNOSIS — R001 Bradycardia, unspecified: Secondary | ICD-10-CM | POA: Diagnosis not present

## 2015-10-11 DIAGNOSIS — E875 Hyperkalemia: Secondary | ICD-10-CM | POA: Diagnosis not present

## 2015-10-11 DIAGNOSIS — Z950 Presence of cardiac pacemaker: Secondary | ICD-10-CM | POA: Diagnosis not present

## 2015-10-11 DIAGNOSIS — I442 Atrioventricular block, complete: Secondary | ICD-10-CM | POA: Diagnosis not present

## 2015-10-11 DIAGNOSIS — I441 Atrioventricular block, second degree: Secondary | ICD-10-CM | POA: Diagnosis not present

## 2015-10-11 DIAGNOSIS — I495 Sick sinus syndrome: Secondary | ICD-10-CM | POA: Diagnosis not present

## 2015-10-11 DIAGNOSIS — E039 Hypothyroidism, unspecified: Secondary | ICD-10-CM | POA: Diagnosis not present

## 2015-10-11 DIAGNOSIS — I455 Other specified heart block: Secondary | ICD-10-CM | POA: Diagnosis not present

## 2015-10-11 HISTORY — PX: PACEMAKER INSERTION: SHX728

## 2015-10-11 SURGERY — INSERTION, CARDIAC PACEMAKER
Anesthesia: General

## 2015-10-11 MED ORDER — FENTANYL CITRATE (PF) 100 MCG/2ML IJ SOLN: 25.0000 ug | INTRAMUSCULAR | Status: DC | PRN

## 2015-10-11 MED ORDER — SODIUM CHLORIDE 0.9 % IR SOLN
Status: DC | PRN
  Administered 2015-10-11: 240 mL

## 2015-10-11 MED ORDER — ACETAMINOPHEN 325 MG PO TABS: 325.0000 mg | ORAL_TABLET | ORAL | Status: DC | PRN

## 2015-10-11 MED ORDER — VANCOMYCIN HCL IN DEXTROSE 1-5 GM/200ML-% IV SOLN
1000.0000 mg | Freq: Two times a day (BID) | INTRAVENOUS | Status: AC
  Administered 2015-10-12: 1000 mg via INTRAVENOUS
  Filled 2015-10-11: qty 200

## 2015-10-11 MED ORDER — PROPOFOL 10 MG/ML IV BOLUS
INTRAVENOUS | Status: DC | PRN
  Administered 2015-10-11: 10 mg via INTRAVENOUS
  Administered 2015-10-11: 40 mg via INTRAVENOUS
  Administered 2015-10-11 (×2): 10 mg via INTRAVENOUS

## 2015-10-11 MED ORDER — GENTAMICIN SULFATE 40 MG/ML IJ SOLN
80.0000 mg | INTRAMUSCULAR | Status: DC
  Filled 2015-10-11: qty 2

## 2015-10-11 MED ORDER — LACTATED RINGERS IV SOLN
INTRAVENOUS | Status: DC | PRN
  Administered 2015-10-11: 14:00:00 via INTRAVENOUS

## 2015-10-11 MED ORDER — SODIUM CHLORIDE 0.9 % IV SOLN
INTRAVENOUS | Status: DC
  Administered 2015-10-11: 13:00:00 via INTRAVENOUS

## 2015-10-11 MED ORDER — SODIUM CHLORIDE FLUSH 0.9 % IV SOLN
INTRAVENOUS | Status: AC
  Administered 2015-10-11: 3 mL
  Filled 2015-10-11: qty 10

## 2015-10-11 MED ORDER — VANCOMYCIN HCL IN DEXTROSE 1-5 GM/200ML-% IV SOLN
1000.0000 mg | INTRAVENOUS | Status: AC
  Administered 2015-10-11: 1000 mg via INTRAVENOUS

## 2015-10-11 MED ORDER — ONDANSETRON HCL 4 MG/2ML IJ SOLN: 4.0000 mg | Freq: Four times a day (QID) | INTRAMUSCULAR | Status: DC | PRN

## 2015-10-11 MED ORDER — LIDOCAINE 1 % OPTIME INJ - NO CHARGE
INTRAMUSCULAR | Status: DC | PRN
  Administered 2015-10-11: 28 mL

## 2015-10-11 MED ORDER — SODIUM CHLORIDE 0.9 % IJ SOLN
INTRAMUSCULAR | Status: DC | PRN
  Administered 2015-10-11: 6 mL via INTRAVENOUS

## 2015-10-11 SURGICAL SUPPLY — 35 items
BAG DECANTER FOR FLEXI CONT (MISCELLANEOUS) ×2 IMPLANT
BRUSH SCRUB 4% CHG (MISCELLANEOUS) ×2 IMPLANT
CABLE SURG 12 DISP A/V CHANNEL (MISCELLANEOUS) ×4 IMPLANT
CANISTER SUCT 1200ML W/VALVE (MISCELLANEOUS) ×2 IMPLANT
CHLORAPREP W/TINT 26ML (MISCELLANEOUS) ×2 IMPLANT
COVER LIGHT HANDLE STERIS (MISCELLANEOUS) ×4 IMPLANT
COVER MAYO STAND STRL (DRAPES) ×2 IMPLANT
DRAPE C-ARM XRAY 36X54 (DRAPES) ×2 IMPLANT
DRESSING TELFA 4X3 1S ST N-ADH (GAUZE/BANDAGES/DRESSINGS) ×2 IMPLANT
DRSG TEGADERM 4X4.75 (GAUZE/BANDAGES/DRESSINGS) ×2 IMPLANT
ELECT REM PT RETURN 9FT ADLT (ELECTROSURGICAL) ×2
ELECTRODE REM PT RTRN 9FT ADLT (ELECTROSURGICAL) ×1 IMPLANT
GLOVE BIO SURGEON STRL SZ7.5 (GLOVE) ×4 IMPLANT
GLOVE BIO SURGEON STRL SZ8 (GLOVE) ×4 IMPLANT
GOWN STRL REUS W/ TWL LRG LVL3 (GOWN DISPOSABLE) ×1 IMPLANT
GOWN STRL REUS W/ TWL XL LVL3 (GOWN DISPOSABLE) ×1 IMPLANT
GOWN STRL REUS W/TWL LRG LVL3 (GOWN DISPOSABLE) ×1
GOWN STRL REUS W/TWL XL LVL3 (GOWN DISPOSABLE) ×1
IMMOBILIZER SHDR MD LX WHT (SOFTGOODS) ×2 IMPLANT
IMMOBILIZER SHDR XL LX WHT (SOFTGOODS) IMPLANT
INTRO PACEMAKR LEAD 9FR 13CM (INTRODUCER) ×2
INTRO PACEMKR SHEATH II 7FR (MISCELLANEOUS) ×4
INTRODUCER PACEMKR LD 9FR 13CM (INTRODUCER) ×1 IMPLANT
INTRODUCER PACEMKR SHTH II 7FR (MISCELLANEOUS) ×2 IMPLANT
IV NS 500ML (IV SOLUTION) ×1
IV NS 500ML BAXH (IV SOLUTION) ×1 IMPLANT
KIT RM TURNOVER STRD PROC AR (KITS) ×2 IMPLANT
LABEL OR SOLS (LABEL) ×2 IMPLANT
LEAD CAPSURE NOVUS 5076-52CM (Lead) ×2 IMPLANT
MARKER SKIN DUAL TIP RULER LAB (MISCELLANEOUS) ×2 IMPLANT
PACEMAKER LEAD ATRL (Lead) ×2 IMPLANT
PACK PACE INSERTION (MISCELLANEOUS) ×2 IMPLANT
PAD STATPAD (MISCELLANEOUS) IMPLANT
PPM ADVISA MRI DR A2DR01 (Pacemaker) ×2 IMPLANT
SUT SILK 0 SH 30 (SUTURE) ×6 IMPLANT

## 2015-10-11 NOTE — Anesthesia Preprocedure Evaluation (Signed)
Anesthesia Evaluation  Patient identified by MRN, date of birth, ID band Patient awake    Reviewed: Allergy & Precautions, NPO status , Patient's Chart, lab work & pertinent test results  History of Anesthesia Complications Negative for: history of anesthetic complications  Airway Mallampati: I  TM Distance: >3 FB Neck ROM: Full    Dental  (+) Poor Dentition   Pulmonary neg pulmonary ROS, neg sleep apnea, neg COPD,    breath sounds clear to auscultation- rhonchi (-) wheezing      Cardiovascular Exercise Tolerance: Good hypertension, (-) angina+ Cardiac Stents (2001)  + dysrhythmias (symptomatic bradycardia)  Rhythm:Regular Rate:Normal - Systolic murmurs and - Diastolic murmurs    Neuro/Psych negative neurological ROS  negative psych ROS   GI/Hepatic negative GI ROS, Neg liver ROS,   Endo/Other  negative endocrine ROSneg diabetesHypothyroidism   Renal/GU negative Renal ROS     Musculoskeletal negative musculoskeletal ROS (+)   Abdominal (+) - obese,   Peds  Hematology negative hematology ROS (+)   Anesthesia Other Findings   Reproductive/Obstetrics negative OB ROS                             Anesthesia Physical Anesthesia Plan  ASA: II  Anesthesia Plan: General   Post-op Pain Management:    Induction: Intravenous  Airway Management Planned: Natural Airway  Additional Equipment:   Intra-op Plan:   Post-operative Plan:   Informed Consent: I have reviewed the patients History and Physical, chart, labs and discussed the procedure including the risks, benefits and alternatives for the proposed anesthesia with the patient or authorized representative who has indicated his/her understanding and acceptance.   Dental advisory given  Plan Discussed with: Anesthesiologist and CRNA  Anesthesia Plan Comments:         Anesthesia Quick Evaluation

## 2015-10-11 NOTE — Anesthesia Postprocedure Evaluation (Signed)
Anesthesia Post Note  Patient: Natalie Stone  Procedure(s) Performed: Procedure(s) (LRB): INSERTION PACEMAKER (N/A)  Patient location during evaluation: PACU Anesthesia Type: General Level of consciousness: awake and alert Pain management: pain level controlled Vital Signs Assessment: post-procedure vital signs reviewed and stable Respiratory status: spontaneous breathing, nonlabored ventilation and respiratory function stable Cardiovascular status: blood pressure returned to baseline and stable Postop Assessment: no signs of nausea or vomiting Anesthetic complications: no    Last Vitals:  Vitals:   10/11/15 1255 10/11/15 1516  BP: (!) 111/93 (!) 157/67  Pulse: 65   Resp: 16 16  Temp: (!) 35.9 C     Last Pain:  Vitals:   10/11/15 1255  TempSrc: Tympanic  PainSc: 0-No pain                 Toyna Erisman

## 2015-10-11 NOTE — Progress Notes (Signed)
[place] at [place] NAME: Natalie Stone    MR#:  [id number]  DATE OF BIRTH:  March 24, 1926  SUBJECTIVE:   Came in after feeling dizzy and light headed Found to have AV block HR in the 70's REVIEW OF SYSTEMS:   Review of Systems  Constitutional: Negative for chills, fever and weight loss.  HENT: Negative for ear discharge, ear pain and nosebleeds.   Eyes: Negative for blurred vision, pain and discharge.  Respiratory: Negative for sputum production, shortness of breath, wheezing and stridor.   Cardiovascular: Negative for chest pain, palpitations, orthopnea and PND.  Gastrointestinal: Negative for abdominal pain, diarrhea, nausea and vomiting.  Genitourinary: Negative for frequency and urgency.  Musculoskeletal: Negative for back pain and joint pain.  Neurological: Positive for dizziness. Negative for sensory change, speech change, focal weakness and weakness.  Psychiatric/Behavioral: Negative for depression and hallucinations. The patient is not nervous/anxious.    Tolerating Diet:yes Tolerating PT: ambulatory  DRUG ALLERGIES:   Allergies  Allergen Reactions  . Penicillins Anaphylaxis    Has patient had a PCN reaction causing immediate rash, facial/tongue/throat swelling, SOB or lightheadedness with hypotension: Yes Has patient had a PCN reaction causing severe rash involving mucus membranes or skin necrosis: No Has patient had a PCN reaction that required hospitalization No Has patient had a PCN reaction occurring within the last 10 years: No If all of the above answers are "NO", then may proceed with Cephalosporin use.    VITALS:  Blood pressure (!) 146/57, pulse 76, temperature 98.1 F (36.7 C), temperature source Oral, resp. rate 14, height 5\' 2"  (1.575 m), weight 117 lb 3.2 oz (53.2 kg), SpO2 98 %.  PHYSICAL EXAMINATION:   Physical Exam  GENERAL:  82 y.o.-year-old patient lying in the bed with no acute distress.  EYES:  Pupils equal, round, reactive to light and accommodation. No scleral icterus. Extraocular muscles intact.  HEENT: Head atraumatic, normocephalic. Oropharynx and nasopharynx clear.  NECK:  Supple, no jugular venous distention. No thyroid enlargement, no tenderness.  LUNGS: Normal breath sounds bilaterally, no wheezing, rales, rhonchi. No use of accessory muscles of respiration.  CARDIOVASCULAR: S1, S2 normal. No murmurs, rubs, or gallops.  ABDOMEN: Soft, nontender, nondistended. Bowel sounds present. No organomegaly or mass.  EXTREMITIES: No cyanosis, clubbing or edema b/l.    NEUROLOGIC: Cranial nerves II through XII are intact. No focal Motor or sensory deficits b/l.   PSYCHIATRIC:  patient is alert and oriented x 3.  SKIN: No obvious rash, lesion, or ulcer.   LABORATORY PANEL:  CBC  Recent Labs Lab 10/10/15 0543  WBC 5.8  HGB 12.6  HCT 36.6  PLT 189    Chemistries   Recent Labs Lab 10/09/15 1935 10/10/15 0543  NA 142 143  K 5.2* 3.6  CL 107 108  CO2 30 30  GLUCOSE 110* 95  BUN 18 14  CREATININE 0.82 0.49  CALCIUM 9.1 8.5*  MG 2.0  --   AST 41  --   ALT 51  --   ALKPHOS 68  --   BILITOT 0.8  --    Cardiac Enzymes  Recent Labs Lab 10/09/15 1935  TROPONINI <0.03   RADIOLOGY:  No results found. ASSESSMENT AND PLAN:  Natalie Stone  is a 82 y.o. female with a known history of Colon cancer history, hypothyroidism- was having some cramps in her legs and so started taking potassium supplements over-the-counter for last few days. In ER she was noted to  have bradycardia and third-degree AV block, so started on cardiac monitor and pacer as standby  * Third-degree AV block with bradycardia and symptoms of dizziness -pt is asymptomatic  -Pacemaker implant today by dr Josefa Half  * Hypothyroidism   Continue Synthroid supplement   * Hyperlipidemia   Continue atorvastatin.  Case discussed with Care Management/Social Worker. Management plans discussed with the  patient, family and they are in agreement.  CODE STATUS:FULL DVT Prophylaxis: Lovenox TOTAL TIME TAKING CARE OF THIS PATIENT:30 minutes.  >50% time spent on counselling and coordination of care  POSSIBLE D/C IN 1 DAY, DEPENDING ON CLINICAL CONDITION.  Note: This dictation was prepared with Dragon dictation along with smaller phrase technology. Any transcriptional errors that result from this process are unintentional.  Breshay Ilg M.D on 10/11/2015 at 8:41 AM  Between 7am to 6pm - Pager - [phone]  After 6pm go to [email or web address] - password [email or web address] [facility]  Office  [phone]  CC: Primary care physician; Rusty Aus, MD

## 2015-10-11 NOTE — Progress Notes (Signed)
SUBJECTIVE: Patient had spell where she felt shaky and dizzy last night and I was called and external pacemaker was placed as a standby.   Vitals:   10/10/15 1106 10/10/15 2159 10/10/15 2301 10/11/15 0443  BP: (!) 126/40 (!) 131/39 (!) 149/79 (!) 146/57  Pulse: 69 (!) 41 78 76  Resp: 18 15  14   Temp: 98.1 F (36.7 C)   98.1 F (36.7 C)  TempSrc: Oral   Oral  SpO2: 97% 97% 100% 98%  Weight:      Height:        Intake/Output Summary (Last 24 hours) at 10/11/15 0806 Last data filed at 10/10/15 2313  Gross per 24 hour  Intake              483 ml  Output              500 ml  Net              -17 ml    LABS: Basic Metabolic Panel:  Recent Labs  10/09/15 1935 10/10/15 0543  NA 142 143  K 5.2* 3.6  CL 107 108  CO2 30 30  GLUCOSE 110* 95  BUN 18 14  CREATININE 0.82 0.49  CALCIUM 9.1 8.5*  MG 2.0  --    Liver Function Tests:  Recent Labs  10/09/15 1935  AST 41  ALT 51  ALKPHOS 68  BILITOT 0.8  PROT 7.1  ALBUMIN 3.7   No results for input(s): LIPASE, AMYLASE in the last 72 hours. CBC:  Recent Labs  10/09/15 1935 10/10/15 0543  WBC 7.9 5.8  HGB 13.6 12.6  HCT 40.3 36.6  MCV 92.8 91.8  PLT 207 189   Cardiac Enzymes:  Recent Labs  10/09/15 1935  TROPONINI <0.03   BNP: Invalid input(s): POCBNP D-Dimer: No results for input(s): DDIMER in the last 72 hours. Hemoglobin A1C: No results for input(s): HGBA1C in the last 72 hours. Fasting Lipid Panel: No results for input(s): CHOL, HDL, LDLCALC, TRIG, CHOLHDL, LDLDIRECT in the last 72 hours. Thyroid Function Tests:  Recent Labs  10/09/15 1935  TSH 0.324*   Anemia Panel: No results for input(s): VITAMINB12, FOLATE, FERRITIN, TIBC, IRON, RETICCTPCT in the last 72 hours.   PHYSICAL EXAM General: Well developed, well nourished, in no acute distress HEENT:  Normocephalic and atramatic Neck:  No JVD.  Lungs: Clear bilaterally to auscultation and percussion. Heart: HRRR . Normal S1 and S2 without  gallops or murmurs.  Abdomen: Bowel sounds are positive, abdomen soft and non-tender  Msk:  Back normal, normal gait. Normal strength and tone for age. Extremities: No clubbing, cyanosis or edema.   Neuro: Alert and oriented X 3. Psych:  Good affect, responds appropriately  TELEMETRY:Apparently had 12 second pause when she had dizzy spell  ASSESSMENT AND PLAN: Presyncope with dizziness associated with 12 second pause. Patient states that this is what's been happening in the past week or so while she was at home. Right now her heart rate is adequate about 70. Last night nurse called me twice and we decided that the external pacemaker would be placed but only for demand if needed. Patient is having permanent pacemaker implantation this morning by Dr. Josefa Half who was consulted yesterday.  Principal Problem:   Third degree AV block (HCC) Active Problems:   Bradycardia    Gal Smolinski A, MD, [facility] 10/11/2015 8:06 AM

## 2015-10-11 NOTE — Progress Notes (Signed)
Patient having episodes of feeling like she " blacked out". She feels short of breath and shaky after the initial feeling. Vital signs stable. Hospitalist notified. Acknowledged. Recommended to notify cardiologist. Cardiologist notified. Acknowledged. No new orders placed. Patient requested all four side rails to be up for comfort and safety. Will continue to monitor.

## 2015-10-11 NOTE — Progress Notes (Signed)
Central telemetry called about patient rhythm of 12 second pause. [facility] on call notified. Acknowledged. Recommended to keep pads on patient and to notify cardiologist following patient if condition worsens.

## 2015-10-11 NOTE — Progress Notes (Signed)
Patient returned from specials post pacemaker placement,alert and oriented x4,hemodynamically stable,denies pain,dressing clean,dry and intact,ventricular paced on tele,close monitoring in progress.

## 2015-10-11 NOTE — Op Note (Signed)
[facility]   10/09/2015 - 10/11/2015                     3:06 PM  PATIENT:  Natalie Stone    PRE-OPERATIVE DIAGNOSIS:  high grade AV block  POST-OPERATIVE DIAGNOSIS:  Same  PROCEDURE:  INSERTION PACEMAKER  SURGEON:  Revanth Neidig, MD    ANESTHESIA:     PREOPERATIVE INDICATIONS:  Natalie Stone is a  82 y.o. female with a diagnosis of high grade AV block who failed conservative measures and elected for surgical management.    The risks benefits and alternatives were discussed with the patient preoperatively including but not limited to the risks of infection, bleeding, cardiopulmonary complications, the need for revision surgery, among others, and the patient was willing to proceed.   OPERATIVE PROCEDURE: The patient was brought to the operating room the fasting state. The left pectoral region was prepped and draped in the usual sterile manner. Anesthesia was obtained 1% lidocaine. A 6 cm incision was performed a left pectoral region. The pacemaker pocket was generated by electrocautery and blunt dissection. Access was obtained to left subclavian vein by fine needle aspiration. MRI compatible leads were positioned to right ventricular apical septum and right atrial appendage under fluoroscopic guidance. After proper thresholds were obtained the leads were sutured in place. The pacemaker pocket was irrigated gentamicin solution. The pacemaker leads were connected to a MRI compatible dual-chamber rate responsive pacemaker generator (Medtronic A2 DRO 1), and was positioned into the pocket. The pocket was closed with 2-0 and 4-0 Vicryl, respectively. Steri-Strips and a pressure dressing were applied.

## 2015-10-11 NOTE — Progress Notes (Signed)
Shoulder sling on left arm

## 2015-10-11 NOTE — Transfer of Care (Signed)
Immediate Anesthesia Transfer of Care Note  Patient: Natalie Stone  Procedure(s) Performed: Procedure(s): INSERTION PACEMAKER (N/A)  Patient Location: PACU  Anesthesia Type:General  Level of Consciousness: awake, alert  and oriented  Airway & Oxygen Therapy: Patient Spontanous Breathing  Post-op Assessment: Report given to RN and Post -op Vital signs reviewed and stable  Post vital signs: Reviewed and stable  Last Vitals:  Vitals:   10/11/15 1255 10/11/15 1516  BP: (!) 111/93 (!) 157/67  Pulse: 65   Resp: 16 16  Temp: (!) 35.9 C     Last Pain:  Vitals:   10/11/15 1255  TempSrc: Tympanic  PainSc: 0-No pain         Complications: No apparent anesthesia complications

## 2015-10-11 NOTE — Progress Notes (Signed)
Patient remains hemodynamically stable post pacemaker placement,denies pain and surgical site dressing is clean,dry and intact.

## 2015-10-12 ENCOUNTER — Encounter: Payer: Self-pay | Admitting: Cardiology

## 2015-10-12 DIAGNOSIS — I442 Atrioventricular block, complete: Secondary | ICD-10-CM | POA: Diagnosis not present

## 2015-10-12 DIAGNOSIS — E039 Hypothyroidism, unspecified: Secondary | ICD-10-CM | POA: Diagnosis not present

## 2015-10-12 DIAGNOSIS — E875 Hyperkalemia: Secondary | ICD-10-CM | POA: Diagnosis not present

## 2015-10-12 DIAGNOSIS — I455 Other specified heart block: Secondary | ICD-10-CM | POA: Diagnosis not present

## 2015-10-12 DIAGNOSIS — R001 Bradycardia, unspecified: Secondary | ICD-10-CM | POA: Diagnosis not present

## 2015-10-12 MED ORDER — METOPROLOL TARTRATE 25 MG PO TABS
25.0000 mg | ORAL_TABLET | Freq: Two times a day (BID) | ORAL | Status: DC
  Administered 2015-10-12 – 2015-10-13 (×3): 25 mg via ORAL
  Filled 2015-10-12 (×3): qty 1

## 2015-10-12 MED ORDER — ENOXAPARIN SODIUM 40 MG/0.4ML ~~LOC~~ SOLN
40.0000 mg | SUBCUTANEOUS | Status: DC
  Administered 2015-10-12: 40 mg via SUBCUTANEOUS
  Filled 2015-10-12: qty 0.4

## 2015-10-12 NOTE — Care Management (Signed)
Had anticipated discharge today but patient has had episodes where heart goes up on exertion to 120's.  Started on metoprolol and obtained order for physical therapy consult.  Independent in all adls, denies issues accessing medical care, obtaining medications or with transportation.  Current with her PCP.

## 2015-10-12 NOTE — Evaluation (Signed)
Physical Therapy Evaluation Patient Details Name: Natalie Stone MRN: [id number] DOB: 1927-01-22 Today's Date: 10/12/2015   History of Present Illness  82 yo female with onset of syncopal episodes with delayed pulses, now with a new pacemaker and restrictions for LUE.  PMHx:  colon and ovarian CA, leg cramps, bradycardia, third degree AV block, hyperthyroidism  Clinical Impression  Pt is motivated to walk and move, follows instructions well for transition from bed to sit and back.  Her gait is slightly unsteady with min guard needed, likely worsened by the use of the sling.  Her strength on RLE has been an issue in the past with gait and will continue strengthening acutely to hopefully increase standing and gait stability for home.    Follow Up Recommendations Outpatient PT;Supervision for mobility/OOB    Equipment Recommendations  None recommended by PT    Recommendations for Other Services Rehab consult     Precautions / Restrictions Precautions Precautions: Fall (telemetry) Required Braces or Orthoses: Sling Restrictions Weight Bearing Restrictions: Yes LUE Weight Bearing: Non weight bearing Other Position/Activity Restrictions: wear shoulder sling when OOB      Mobility  Bed Mobility Overal bed mobility: Needs Assistance Bed Mobility: Supine to Sit;Sit to Supine     Supine to sit: Min assist Sit to supine: Min guard;Min assist   General bed mobility comments: cued sequence with sling on LUE  Transfers Overall transfer level: Needs assistance Equipment used: 1 person hand held assist Transfers: Sit to/from [facility] Sit to Stand: Min guard Stand pivot transfers: Min guard       General transfer comment: min guard for safety due to fall risk  Ambulation/Gait Ambulation/Gait assistance: Min guard Ambulation Distance (Feet): 150 Feet Assistive device: 1 person hand held assist Gait Pattern/deviations: Step-through pattern;Trunk flexed;Wide base of  support;Shuffle;Decreased stride length Gait velocity: reduced Gait velocity interpretation: Below normal speed for age/gender General Gait Details: Pt is accomodating RLE with careful gait, pulses were 81 to 92 with mobility, O2 sats 98% for the effort.  Stairs            Wheelchair Mobility    Modified Rankin (Stroke Patients Only)       Balance Overall balance assessment: Needs assistance Sitting-balance support: Feet supported Sitting balance-Leahy Scale: Good     Standing balance support: Single extremity supported Standing balance-Leahy Scale: Fair                               Pertinent Vitals/Pain Pain Assessment: Faces Faces Pain Scale: Hurts a little bit Pain Location: L shoulder Pain Intervention(s): Monitored during session;Repositioned    Home Living Family/patient expects to be discharged to:: Other (Comment) ([place] [facility]) Living Arrangements: Spouse/significant other               Additional Comments: spouse is very HOH    Prior Function Level of Independence: Independent               Hand Dominance   Dominant Hand: Right    Extremity/Trunk Assessment   Upper Extremity Assessment: LUE deficits/detail       LUE Deficits / Details: actively moves but restrained in a sling   Lower Extremity Assessment: RLE deficits/detail RLE Deficits / Details: R hip and knee are 4- to 4 strength    Cervical / Trunk Assessment: Kyphotic  Communication   Communication: No difficulties  Cognition Arousal/Alertness: Awake/alert Behavior During Therapy: [facility] for tasks  assessed/performed Overall Cognitive Status: Within Functional Limits for tasks assessed                      General Comments General comments (skin integrity, edema, etc.): Pt had much more controlled pulses for all her mobility than earlier today, husband was available to instruct him on application and doffing of sling    Exercises         Assessment/Plan    PT Assessment Patient needs continued PT services  PT Diagnosis Difficulty walking   PT Problem List Decreased strength;Decreased range of motion;Decreased activity tolerance;Decreased balance;Decreased mobility;Decreased coordination;Decreased knowledge of use of DME;Cardiopulmonary status limiting activity;Decreased skin integrity;Pain  PT Treatment Interventions DME instruction;Gait training;Functional mobility training;Therapeutic activities;Therapeutic exercise;Balance training;Neuromuscular re-education;Patient/family education   PT Goals (Current goals can be found in the Care Plan section) Acute Rehab PT Goals Patient Stated Goal: to walk and get home tomorrow PT Goal Formulation: With patient/family Time For Goal Achievement: 10/26/15 Potential to Achieve Goals: Good    Frequency Min 2X/week   Barriers to discharge   home with husband who is able to assist her with sling    Co-evaluation               End of Session Equipment Utilized During Treatment: Gait belt;Other (comment) (shoulder sling, pulse ox) Activity Tolerance: Patient tolerated treatment well;Patient limited by fatigue Patient left: in bed;with call bell/phone within reach;with bed alarm set;with family/visitor present Nurse Communication: Mobility status    Functional Assessment Tool Used: clinical judgment  Functional Limitation: Mobility: Walking and moving around Mobility: Walking and Moving Around Current Status [id number]): At least 20 percent but less than 40 percent impaired, limited or restricted Mobility: Walking and Moving Around Goal Status [phone]): At least 1 percent but less than 20 percent impaired, limited or restricted    Time: 1643-1716 PT Time Calculation (min) (ACUTE ONLY): 33 min   Charges:   PT Evaluation $PT Eval Moderate Complexity: 1 Procedure PT Treatments $Gait Training: 8-22 mins   PT G Codes:   PT G-Codes **NOT FOR INPATIENT CLASS** Functional  Assessment Tool Used: clinical judgment  Functional Limitation: Mobility: Walking and moving around Mobility: Walking and Moving Around Current Status [id number]): At least 20 percent but less than 40 percent impaired, limited or restricted Mobility: Walking and Moving Around Goal Status [phone]): At least 1 percent but less than 20 percent impaired, limited or restricted    Ramond Dial 10/12/2015, 5:47 PM    Mee Hives, PT MS Acute Rehab Dept. Number: [place] [facility] [place]

## 2015-10-12 NOTE — Clinical Social Work Note (Signed)
MSW received consult that patient is from a facility.  MSW met with patient and she lives with her husband at [facility] independent living, MSW will sign off, please reconsult if social work needs arise.

## 2015-10-12 NOTE — Progress Notes (Signed)
Made aware by CCMD, patients heart rate was up to 120. Upon entering room patient up to bedside. No c/o pain no distress. Once patient returned to bed current heart rate 86. Will continue to monitor closely

## 2015-10-12 NOTE — Progress Notes (Signed)
Spoke with cardiologist PA nina hammond regarding elevated heart rate with exertion. She recommend metoprolol tartrate 25mg  po bid, stated to speak with medical md regarding recommendation

## 2015-10-12 NOTE — Progress Notes (Signed)
  SUBJECTIVE: The patient had pacemaker placement yesterday. She is resting in bed comfortably and discusses her limitations in movement post pacemaker placement. Questions answered. She denies any chest pain, shortness of breath, or dizziness. Not been up out of bed yet.   Vitals:   10/11/15 1815 10/11/15 1927 10/12/15 0020 10/12/15 0432  BP: (!) 138/56 (!) 134/58 (!) 157/63 (!) 159/63  Pulse: 68 81 76 75  Resp: 16 20    Temp: 98.2 F (36.8 C) 98.3 F (36.8 C) 98.3 F (36.8 C) 98.3 F (36.8 C)  TempSrc: Oral Oral Oral Oral  SpO2: 99% 97% 96% 97%  Weight:      Height:        Intake/Output Summary (Last 24 hours) at 10/12/15 0859 Last data filed at 10/12/15 [id number]  Gross per 24 hour  Intake             1140 ml  Output              950 ml  Net              190 ml    LABS: Basic Metabolic Panel:  Recent Labs  10/09/15 1935 10/10/15 0543  NA 142 143  K 5.2* 3.6  CL 107 108  CO2 30 30  GLUCOSE 110* 95  BUN 18 14  CREATININE 0.82 0.49  CALCIUM 9.1 8.5*  MG 2.0  --    Liver Function Tests:  Recent Labs  10/09/15 1935  AST 41  ALT 51  ALKPHOS 68  BILITOT 0.8  PROT 7.1  ALBUMIN 3.7   No results for input(s): LIPASE, AMYLASE in the last 72 hours. CBC:  Recent Labs  10/09/15 1935 10/10/15 0543  WBC 7.9 5.8  HGB 13.6 12.6  HCT 40.3 36.6  MCV 92.8 91.8  PLT 207 189   Cardiac Enzymes:  Recent Labs  10/09/15 1935  TROPONINI <0.03   BNP: Invalid input(s): POCBNP D-Dimer: No results for input(s): DDIMER in the last 72 hours. Hemoglobin A1C: No results for input(s): HGBA1C in the last 72 hours. Fasting Lipid Panel: No results for input(s): CHOL, HDL, LDLCALC, TRIG, CHOLHDL, LDLDIRECT in the last 72 hours. Thyroid Function Tests:  Recent Labs  10/09/15 1935  TSH 0.324*   Anemia Panel: No results for input(s): VITAMINB12, FOLATE, FERRITIN, TIBC, IRON, RETICCTPCT in the last 72 hours.   PHYSICAL EXAM General: Well developed, well nourished, in  no acute distress HEENT:  Normocephalic and atramatic Neck:  No JVD.  Lungs: Clear bilaterally to auscultation and percussion. Heart: HRRR . Normal S1 and S2 without gallops or murmurs.  Abdomen: Bowel sounds are positive, abdomen soft and non-tender  Msk:  Back normal, normal gait. Normal strength and tone for age. Extremities: No clubbing, cyanosis or edema.   Neuro: Alert and oriented X 3. Psych:  Good affect, responds appropriately  TELEMETRY: Ventricular pacing with rates in the 70s  ASSESSMENT AND PLAN: Syncope with dizziness and a 12 second pause noted. Patient has been experiencing similar symptoms for about a week or so prior to admission. Permanent pacemaker was inserted and currently she is ventricular paced in the 70s. Post pacemaker care has been discussed with the patient and a follow-up appointment has been given for Monday at 9:00 at [place].  Principal Problem:   Third degree AV block ([place]) Active Problems:   Bradycardia    Daune Perch, NP 10/12/2015 8:59 AM

## 2015-10-12 NOTE — Progress Notes (Signed)
[place] [facility] [place] NAME: Natalie Stone    MR#:  [id number]  DATE OF BIRTH:  10-11-1926  SUBJECTIVE:  S/p pacer but HR still running up (120) on minimal ambulation, not much symptoms. Husband at bedside. REVIEW OF SYSTEMS:   Review of Systems  Constitutional: Negative for chills, fever and weight loss.  HENT: Negative for ear discharge, ear pain and nosebleeds.   Eyes: Negative for blurred vision, pain and discharge.  Respiratory: Negative for sputum production, shortness of breath, wheezing and stridor.   Cardiovascular: Negative for chest pain, palpitations, orthopnea and PND.  Gastrointestinal: Negative for abdominal pain, diarrhea, nausea and vomiting.  Genitourinary: Negative for frequency and urgency.  Musculoskeletal: Negative for back pain and joint pain.  Neurological: Positive for dizziness. Negative for sensory change, speech change, focal weakness and weakness.  Psychiatric/Behavioral: Negative for depression and hallucinations. The patient is not nervous/anxious.    Tolerating Diet:yes Tolerating PT: ambulatory  DRUG ALLERGIES:   Allergies  Allergen Reactions  . Penicillins Anaphylaxis    Has patient had a PCN reaction causing immediate rash, facial/tongue/throat swelling, SOB or lightheadedness with hypotension: Yes Has patient had a PCN reaction causing severe rash involving mucus membranes or skin necrosis: No Has patient had a PCN reaction that required hospitalization No Has patient had a PCN reaction occurring within the last 10 years: No If all of the above answers are "NO", then may proceed with Cephalosporin use.  . Ativan [Lorazepam] Other (See Comments)    Made her not remember for many hours.  . Benadryl [Diphenhydramine] Other (See Comments)    Made her sleep for hours.    VITALS:  Blood pressure (!) 144/48, pulse 81, temperature 97.7 F (36.5 C), temperature source Oral, resp. rate 18, height 5\' 2"   (1.575 m), weight 53.2 kg (117 lb 3.2 oz), SpO2 97 %.  PHYSICAL EXAMINATION:   Physical Exam  GENERAL:  82 y.o.-year-old patient lying in the bed with no acute distress.  EYES: Pupils equal, round, reactive to light and accommodation. No scleral icterus. Extraocular muscles intact.  HEENT: Head atraumatic, normocephalic. Oropharynx and nasopharynx clear.  NECK:  Supple, no jugular venous distention. No thyroid enlargement, no tenderness.  LUNGS: Normal breath sounds bilaterally, no wheezing, rales, rhonchi. No use of accessory muscles of respiration.  CARDIOVASCULAR: S1, S2 normal. No murmurs, rubs, or gallops. Pacer site clean and dressing intact ABDOMEN: Soft, nontender, nondistended. Bowel sounds present. No organomegaly or mass.  EXTREMITIES: No cyanosis, clubbing or edema b/l.    NEUROLOGIC: Cranial nerves II through XII are intact. No focal Motor or sensory deficits b/l.   PSYCHIATRIC:  patient is alert and oriented x 3.  SKIN: No obvious rash, lesion, or ulcer.   LABORATORY PANEL:  CBC  Recent Labs Lab 10/10/15 0543  WBC 5.8  HGB 12.6  HCT 36.6  PLT 189    Chemistries   Recent Labs Lab 10/09/15 1935 10/10/15 0543  NA 142 143  K 5.2* 3.6  CL 107 108  CO2 30 30  GLUCOSE 110* 95  BUN 18 14  CREATININE 0.82 0.49  CALCIUM 9.1 8.5*  MG 2.0  --   AST 41  --   ALT 51  --   ALKPHOS 68  --   BILITOT 0.8  --    Cardiac Enzymes  Recent Labs Lab 10/09/15 1935  TROPONINI <0.03   RADIOLOGY:  Dg Chest Port 1 View  Result Date: 10/11/2015 CLINICAL DATA:  Status post pacemaker placement EXAM: PORTABLE CHEST 1 VIEW COMPARISON:  None. FINDINGS: Cardiac shadow is at the upper limits of normal in size. A pacing device is seen. No pneumothorax is noted following placement. Biapical scarring is seen. No acute bony abnormality is noted. No effusion is seen. IMPRESSION: No pneumothorax following pacemaker placement. Electronically Signed   By: Inez Catalina M.D.   On:  10/11/2015 15:54   ASSESSMENT AND PLAN:  Natalie Stone  is a 82 y.o. female with a known history of Colon cancer history, hypothyroidism- was having some cramps in her legs and so started taking potassium supplements over-the-counter for last few days. In ER she was noted to have bradycardia and third-degree AV block, so started on cardiac monitor and pacer as standby  * Third-degree AV block with bradycardia and symptoms of dizziness - s/p pacer on 8/22 - HR still going up (120) on minimal ambulation, started on metoprolol  - PT c/s  * Hypothyroidism   Continue Synthroid supplement   * Hyperlipidemia   Continue atorvastatin.  Case discussed with Care Management/Social Worker. Management plans discussed with the patient, family and they are in agreement.  CODE STATUS:FULL DVT Prophylaxis: Lovenox TOTAL TIME TAKING CARE OF THIS PATIENT:30 minutes.  >50% time spent on counselling and coordination of care  POSSIBLE D/C IN AM, DEPENDING ON CLINICAL CONDITION. And HR control  Note: This dictation was prepared with Dragon dictation along with smaller phrase technology. Any transcriptional errors that result from this process are unintentional.  [facility], Natalie Stone M.D on 10/12/2015 at 5:26 PM  Between 7am to 6pm - Pager - [phone]  After 6pm go to [email or web address] - password [email or web address] [facility]  Office  [phone]  CC: Primary care physician; Rusty Aus, MD

## 2015-10-13 DIAGNOSIS — E039 Hypothyroidism, unspecified: Secondary | ICD-10-CM | POA: Diagnosis not present

## 2015-10-13 DIAGNOSIS — I455 Other specified heart block: Secondary | ICD-10-CM | POA: Diagnosis not present

## 2015-10-13 DIAGNOSIS — E875 Hyperkalemia: Secondary | ICD-10-CM | POA: Diagnosis not present

## 2015-10-13 DIAGNOSIS — R001 Bradycardia, unspecified: Secondary | ICD-10-CM | POA: Diagnosis not present

## 2015-10-13 DIAGNOSIS — I442 Atrioventricular block, complete: Secondary | ICD-10-CM | POA: Diagnosis not present

## 2015-10-13 LAB — BASIC METABOLIC PANEL
Anion gap: 3 — ABNORMAL LOW (ref 5–15)
BUN: 17 mg/dL (ref 6–20)
CALCIUM: 9 mg/dL (ref 8.9–10.3)
CO2: 32 mmol/L (ref 22–32)
CREATININE: 0.68 mg/dL (ref 0.44–1.00)
Chloride: 105 mmol/L (ref 101–111)
GFR calc Af Amer: >60 mL/min (ref >60)
GLUCOSE: 108 mg/dL — AB (ref 65–99)
Potassium: 4 mmol/L (ref 3.5–5.1)
SODIUM: 140 mmol/L (ref 135–145)

## 2015-10-13 LAB — CBC
HCT: 43.5 % (ref 35.0–47.0)
Hemoglobin: 14.9 g/dL (ref 12.0–16.0)
MCH: 31.8 pg (ref 26.0–34.0)
MCHC: 34.2 g/dL (ref 32.0–36.0)
MCV: 92.9 fL (ref 80.0–100.0)
PLATELETS: 164 10*3/uL (ref 150–440)
RBC: 4.68 MIL/uL (ref 3.80–5.20)
RDW: 13.3 % (ref 11.5–14.5)
WBC: 9.6 10*3/uL (ref 3.6–11.0)

## 2015-10-13 MED ORDER — METOPROLOL TARTRATE 25 MG PO TABS: 25.0000 mg | ORAL_TABLET | Freq: Two times a day (BID) | ORAL | 0 refills | Status: DC

## 2015-10-13 NOTE — Progress Notes (Signed)
Rounded in room with Dr. Manuella Ghazi - patient ready for discharge.  Patient given discharge teaching and paperwork regarding medications, diet, follow-up appointments and activity (pacemaker care). Patient understanding verbalized. No complaints at this time. IV and telemetry discontinued prior to leaving. Skin assessment as previously charted and vitals are stable; on room air. Patient being discharged to home. Caregiver/family present during discharge teaching. Prescription sent electronically to [facility].

## 2015-10-13 NOTE — Progress Notes (Signed)
Patient walked in hall with nurse tech earlier this morning (prior to morning meds being given) and heart rate went to 120, but came back to the 80-90's at rest. Patient asymptomatic, no complaints. Will continue to monitor.

## 2015-10-13 NOTE — Care Management (Signed)
No documentation present to indicate continues to have elevated heart rate since metoprolol started.  Would anticipate discharge today.

## 2015-10-13 NOTE — Progress Notes (Signed)
  SUBJECTIVE: Patient is sitting on side of bed eating breakfast. She has mild tenderness at pacemaker site, but denies substernal chest pain, shortness of breath or dizziness.    Vitals:   10/12/15 1135 10/12/15 1947 10/12/15 2120 10/13/15 0510  BP: (!) 144/48 (!) 134/53  (!) 146/62  Pulse: 81 84 88 76  Resp: 18 16  16   Temp: 97.7 F (36.5 C) 98.6 F (37 C)  98.4 F (36.9 C)  TempSrc: Oral Oral  Oral  SpO2: 97% 96%  97%  Weight:      Height:        Intake/Output Summary (Last 24 hours) at 10/13/15 0815 Last data filed at 10/13/15 0757  Gross per 24 hour  Intake              240 ml  Output             1250 ml  Net            -1010 ml    LABS: Basic Metabolic Panel:  Recent Labs  10/13/15 0515  NA 140  K 4.0  CL 105  CO2 32  GLUCOSE 108*  BUN 17  CREATININE 0.68  CALCIUM 9.0   Liver Function Tests: No results for input(s): AST, ALT, ALKPHOS, BILITOT, PROT, ALBUMIN in the last 72 hours. No results for input(s): LIPASE, AMYLASE in the last 72 hours. CBC:  Recent Labs  10/13/15 0515  WBC 9.6  HGB 14.9  HCT 43.5  MCV 92.9  PLT 164   Cardiac Enzymes: No results for input(s): CKTOTAL, CKMB, CKMBINDEX, TROPONINI in the last 72 hours. BNP: Invalid input(s): POCBNP D-Dimer: No results for input(s): DDIMER in the last 72 hours. Hemoglobin A1C: No results for input(s): HGBA1C in the last 72 hours. Fasting Lipid Panel: No results for input(s): CHOL, HDL, LDLCALC, TRIG, CHOLHDL, LDLDIRECT in the last 72 hours. Thyroid Function Tests: No results for input(s): TSH, T4TOTAL, T3FREE, THYROIDAB in the last 72 hours.  Invalid input(s): FREET3 Anemia Panel: No results for input(s): VITAMINB12, FOLATE, FERRITIN, TIBC, IRON, RETICCTPCT in the last 72 hours.   PHYSICAL EXAM General: Well developed, well nourished, in no acute distress HEENT:  Normocephalic and atramatic Neck:  No JVD.  Lungs: Clear bilaterally to auscultation and percussion. Heart: HRRR . Normal  S1 and S2 without gallops or murmurs.  Abdomen: Bowel sounds are positive, abdomen soft and non-tender  Msk:  Back normal, normal gait. Normal strength and tone for age. Extremities: No clubbing, cyanosis or edema.   Neuro: Alert and oriented X 3. Psych:  Good affect, responds appropriately  Left upper chest pacemaker insertion site without drainage, has mild edema, steri strips intact  TELEMETRY: Ventricular pacing in the 70's  ASSESSMENT AND PLAN: Syncope with dizziness and a 12 second pause noted. Patient has been experiencing similar symptoms for about a week or so prior to admission. Permanent pacemaker was inserted and currently she is ventricular paced in the 70s. She had some elevated heart rate -sinus tachycardia of 120 yesterday with ambulation. Beta blocker was initiated and there has been no ST over night. Advise to ambulate patient this morning and if she tolerates well can be discharged home. Post pacemaker care has been discussed with the patient and a follow-up appointment has been given for Monday at 9:00 at [place].   Principal Problem:   Third degree AV block ([place]) Active Problems:   Bradycardia    Daune Perch, NP 10/13/2015 8:15 AM

## 2015-10-13 NOTE — Progress Notes (Signed)
Patient walked a lap around the nurses' station with nurse tech. Heart rate stayed in the 80's. Patient tolerated well, no complaints. Will continue to monitor.

## 2015-10-13 NOTE — Progress Notes (Signed)
Physical Therapy Treatment Patient Details Name: Natalie Stone MRN: [id number] DOB: 1926/05/24 Today's Date: 10/13/2015    History of Present Illness Pt is an 82 yo female with onset of syncopal episodes with delayed pulses, now with a new pacemaker and restrictions for LUE.  PMHx:  colon and ovarian CA, leg cramps, bradycardia, third degree AV block, hyperthyroidism    PT Comments    Pt ambulated 4 x 50' with combinations of SPC and no AD.  HR monitored throughout with HR ranging from 82-91 bpm during session and SpO2 min of 97%.  Pt presented with min instability with gait without AD and min-mod instability with SPC with poor sequencing.  Pt will benefit from OPPT for continued gait and balance training for decreased fall risk at home upon discharge.    Follow Up Recommendations  Outpatient PT     Equipment Recommendations  None recommended by PT    Recommendations for Other Services       Precautions / Restrictions Precautions Precautions: Fall Required Braces or Orthoses: Sling Restrictions Weight Bearing Restrictions: Yes LUE Weight Bearing: Non weight bearing Other Position/Activity Restrictions: wear shoulder sling when OOB    Mobility  Bed Mobility Overal bed mobility: Needs Assistance Bed Mobility: Supine to Sit;Sit to Supine     Supine to sit: Supervision (Min verbal cues to avoid using LUE) Sit to supine: Supervision      Transfers Overall transfer level: Needs assistance Equipment used: Straight cane;None Transfers: Sit to/from Stand Sit to Stand: Min guard         General transfer comment: min guard for safety due to fall risk  Ambulation/Gait Ambulation/Gait assistance: Min guard Ambulation Distance (Feet): 50 Feet Assistive device: None;Straight cane Gait Pattern/deviations: Step-through pattern;Wide base of support   Gait velocity interpretation: Below normal speed for age/gender General Gait Details: Min instability during gait without AD,  min-mod instability with gait using SPC with difficulty with consistent proper sequencing.   Stairs            Wheelchair Mobility    Modified Rankin (Stroke Patients Only)       Balance                                    Cognition Arousal/Alertness: Awake/alert Behavior During Therapy: WFL for tasks assessed/performed Overall Cognitive Status: Within Functional Limits for tasks assessed                      Exercises Other Exercises Other Exercises: Amb 4 x 50' with combination of no AD and SPC and CGA; HR remained between 82-91 bpm during session with no c/o symptoms Other Exercises: Bed mobility training with SBA and min verbal cues for sequencing to prevent use of LUE. Other Exercises: Static balance training with feet apart, feet together, and semi-tandem with combinations of eyes open/closed and head turns/head still.    General Comments        Pertinent Vitals/Pain Pain Assessment: No/denies pain    Home Living                      Prior Function            PT Goals (current goals can now be found in the care plan section) Progress towards PT goals: Progressing toward goals    Frequency  Min 2X/week    PT Plan Current plan remains appropriate  Co-evaluation             End of Session Equipment Utilized During Treatment: Gait belt Activity Tolerance: Patient tolerated treatment well Patient left: in bed;with call bell/phone within reach;with bed alarm set;with family/visitor present     Time: [id number] PT Time Calculation (min) (ACUTE ONLY): 38 min  Charges:  $Gait Training: 23-37 mins $Therapeutic Exercise: 8-22 mins                    G Codes:      DRoyetta Asal PT, DPT 10/13/15, 3:35 PM

## 2015-10-16 NOTE — Discharge Summary (Signed)
[facility] NAME: Natalie Stone    MR#:  [id number]  DATE OF BIRTH:  [birth year]  DATE OF ADMISSION:  10/09/2015   ADMITTING PHYSICIAN: Vaughan Basta, MD  DATE OF DISCHARGE: 10/13/2015  4:17 PM  PRIMARY CARE PHYSICIAN: Rusty Aus, MD   ADMISSION DIAGNOSIS:  Complete heart block ([place]) [I44.2] Near syncope [R55] DISCHARGE DIAGNOSIS:  Principal Problem:   Third degree AV block (HCC) Active Problems:   Bradycardia  SECONDARY DIAGNOSIS:   Past Medical History:  Diagnosis Date  . Hyperthyroidism   . Ovarian cancer ([place]) 2014  . Third degree AV block ([place]) 10/09/2015   HOSPITAL COURSE:  Natalie Stone a 82 y.o.femalewith a known history of Colon cancer history, hypothyroidism- was having some cramps in her legs and so started taking potassium supplements over-the-counter for last few days before admission but continued to have symptoms so was brought into ED. In the ED she was noted to have bradycardia and third-degree AV block, so started on cardiac monitor and pacer as standby  * Third-degree AV block with bradycardia and symptoms of dizziness - s/p pacer on 8/22 by Dr Saralyn Pilar - HR was going up (120) on minimal ambulation so started on metoprolol and was much better control  - PT evaluated her and recommended outpt PT DISCHARGE CONDITIONS:  STABLE CONSULTS OBTAINED:  Treatment Team:  [name], MD [name], MD DRUG ALLERGIES:   Allergies  Allergen Reactions  . Penicillins Anaphylaxis    Has patient had a PCN reaction causing immediate rash, facial/tongue/throat swelling, SOB or lightheadedness with hypotension: Yes Has patient had a PCN reaction causing severe rash involving mucus membranes or skin necrosis: No Has patient had a PCN reaction that required hospitalization No Has patient had a PCN reaction occurring within the last 10 years: No If all of the above answers are "NO", then may  proceed with Cephalosporin use.  . Ativan [Lorazepam] Other (See Comments)    Made her not remember for many hours.  . Benadryl [Diphenhydramine] Other (See Comments)    Made her sleep for hours.   DISCHARGE MEDICATIONS:     Medication List    TAKE these medications   aspirin EC 81 MG tablet Take 81 mg by mouth at bedtime.   atorvastatin 20 MG tablet Commonly known as:  LIPITOR Take 10 mg by mouth at bedtime.   glucosamine-chondroitin 500-400 MG tablet Take 1 tablet by mouth 3 (three) times daily.   levothyroxine 88 MCG tablet Commonly known as:  SYNTHROID, LEVOTHROID Take 88 mcg by mouth at bedtime.   metoprolol tartrate 25 MG tablet Commonly known as:  LOPRESSOR Take 1 tablet (25 mg total) by mouth 2 (two) times daily.   multivitamin tablet Take 1 tablet by mouth every evening.      DISCHARGE INSTRUCTIONS:   DIET:  Regular diet DISCHARGE CONDITION:  Good ACTIVITY:  Activity as tolerated OXYGEN:  Home Oxygen: No.  Oxygen Delivery: room air DISCHARGE LOCATION:  home   If you experience worsening of your admission symptoms, develop shortness of breath, life threatening emergency, suicidal or homicidal thoughts you must seek medical attention immediately by calling 911 or calling your MD immediately  if symptoms less severe.  You Must read complete instructions/literature along with all the possible adverse reactions/side effects for all the Medicines you take and that have been prescribed to you. Take any new Medicines after you have completely understood and accpet all the possible adverse  reactions/side effects.   Please note  You were cared for by a hospitalist during your hospital stay. If you have any questions about your discharge medications or the care you received while you were in the hospital after you are discharged, you can call the unit and asked to speak with the hospitalist on call if the hospitalist that took care of you is not available. Once  you are discharged, your primary care physician will handle any further medical issues. Please note that NO REFILLS for any discharge medications will be authorized once you are discharged, as it is imperative that you return to your primary care physician (or establish a relationship with a primary care physician if you do not have one) for your aftercare needs so that they can reassess your need for medications and monitor your lab values.    On the day of Discharge:  VITAL SIGNS:  Blood pressure (!) 119/51, pulse 85, temperature 98.3 F (36.8 C), temperature source Oral, resp. rate 18, height 5\' 2"  (1.575 m), weight 53.2 kg (117 lb 3.2 oz), SpO2 97 %. PHYSICAL EXAMINATION:  GENERAL:  82 y.o.-year-old patient lying in the bed with no acute distress.  EYES: Pupils equal, round, reactive to light and accommodation. No scleral icterus. Extraocular muscles intact.  HEENT: Head atraumatic, normocephalic. Oropharynx and nasopharynx clear.  NECK:  Supple, no jugular venous distention. No thyroid enlargement, no tenderness.  LUNGS: Normal breath sounds bilaterally, no wheezing, rales,rhonchi or crepitation. No use of accessory muscles of respiration.  CARDIOVASCULAR: S1, S2 normal. No murmurs, rubs, or gallops.  ABDOMEN: Soft, non-tender, non-distended. Bowel sounds present. No organomegaly or mass.  EXTREMITIES: No pedal edema, cyanosis, or clubbing.  NEUROLOGIC: Cranial nerves II through XII are intact. Muscle strength 5/5 in all extremities. Sensation intact. Gait not checked.  PSYCHIATRIC: The patient is alert and oriented x 3.  SKIN: No obvious rash, lesion, or ulcer.  DATA REVIEW:   CBC  Recent Labs Lab 10/13/15 0515  WBC 9.6  HGB 14.9  HCT 43.5  PLT 164    Chemistries   Recent Labs Lab 10/09/15 1935  10/13/15 0515  NA 142  < > 140  K 5.2*  < > 4.0  CL 107  < > 105  CO2 30  < > 32  GLUCOSE 110*  < > 108*  BUN 18  < > 17  CREATININE 0.82  < > 0.68  CALCIUM 9.1  < > 9.0    MG 2.0  --   --   AST 41  --   --   ALT 51  --   --   ALKPHOS 68  --   --   BILITOT 0.8  --   --   < > = values in this interval not displayed.  Follow-up Information    [name], MD .   Specialty:  Cardiology Why:  Monday, August 28th at [place], ccs Contact information: [place] [address] [phone]        Rusty Aus, MD. Schedule an appointment as soon as possible for a visit in 2 week(s).   Specialty:  Internal Medicine Why:  Wednesday, September 6th at 1030am, Please fill out new patient forms and take to your appointment, ccs Contact information: [address] [place] [address] [phone]            Management plans discussed with the patient, family and they are in agreement.  CODE STATUS: FULL  CODE  TOTAL TIME TAKING CARE OF THIS PATIENT: 45 minutes.    [facility], Loys Hoselton M.D on 10/16/2015 at 1:26 PM  Between 7am to 6pm - Pager - [phone]  After 6pm go to [email or web address] - Proofreader  [facility]  Office  [phone]  CC: Primary care physician; Rusty Aus, MD   Note: This dictation was prepared with Dragon dictation along with smaller phrase technology. Any transcriptional errors that result from this process are unintentional.

## 2015-10-17 DIAGNOSIS — R0602 Shortness of breath: Secondary | ICD-10-CM | POA: Diagnosis not present

## 2015-10-17 DIAGNOSIS — R55 Syncope and collapse: Secondary | ICD-10-CM | POA: Diagnosis not present

## 2015-10-17 DIAGNOSIS — I10 Essential (primary) hypertension: Secondary | ICD-10-CM | POA: Diagnosis not present

## 2015-10-17 DIAGNOSIS — E782 Mixed hyperlipidemia: Secondary | ICD-10-CM | POA: Diagnosis not present

## 2015-10-26 DIAGNOSIS — I2510 Atherosclerotic heart disease of native coronary artery without angina pectoris: Secondary | ICD-10-CM | POA: Diagnosis not present

## 2015-10-26 DIAGNOSIS — Z0000 Encounter for general adult medical examination without abnormal findings: Secondary | ICD-10-CM | POA: Diagnosis not present

## 2015-10-26 DIAGNOSIS — Z950 Presence of cardiac pacemaker: Secondary | ICD-10-CM | POA: Diagnosis not present

## 2015-10-26 DIAGNOSIS — E039 Hypothyroidism, unspecified: Secondary | ICD-10-CM | POA: Insufficient documentation

## 2015-10-26 DIAGNOSIS — T82837A Hemorrhage of cardiac prosthetic devices, implants and grafts, initial encounter: Secondary | ICD-10-CM | POA: Insufficient documentation

## 2015-10-26 DIAGNOSIS — G620 Drug-induced polyneuropathy: Secondary | ICD-10-CM | POA: Insufficient documentation

## 2015-10-26 DIAGNOSIS — T451X5A Adverse effect of antineoplastic and immunosuppressive drugs, initial encounter: Secondary | ICD-10-CM

## 2015-10-26 DIAGNOSIS — I700 Atherosclerosis of aorta: Secondary | ICD-10-CM | POA: Diagnosis not present

## 2015-11-08 DIAGNOSIS — T82837A Hemorrhage of cardiac prosthetic devices, implants and grafts, initial encounter: Secondary | ICD-10-CM | POA: Diagnosis not present

## 2015-11-08 DIAGNOSIS — Z950 Presence of cardiac pacemaker: Secondary | ICD-10-CM | POA: Diagnosis not present

## 2015-11-08 DIAGNOSIS — I700 Atherosclerosis of aorta: Secondary | ICD-10-CM | POA: Diagnosis not present

## 2015-11-08 DIAGNOSIS — I2510 Atherosclerotic heart disease of native coronary artery without angina pectoris: Secondary | ICD-10-CM | POA: Diagnosis not present

## 2015-11-23 ENCOUNTER — Inpatient Hospital Stay: Payer: Commercial Managed Care - HMO | Attending: Internal Medicine | Admitting: Internal Medicine

## 2015-11-23 ENCOUNTER — Encounter: Payer: Self-pay | Admitting: Internal Medicine

## 2015-11-23 ENCOUNTER — Inpatient Hospital Stay: Payer: Commercial Managed Care - HMO

## 2015-11-23 DIAGNOSIS — C569 Malignant neoplasm of unspecified ovary: Secondary | ICD-10-CM | POA: Insufficient documentation

## 2015-11-23 DIAGNOSIS — Z90710 Acquired absence of both cervix and uterus: Secondary | ICD-10-CM | POA: Diagnosis not present

## 2015-11-23 DIAGNOSIS — E0590 Thyrotoxicosis, unspecified without thyrotoxic crisis or storm: Secondary | ICD-10-CM | POA: Insufficient documentation

## 2015-11-23 DIAGNOSIS — Z9221 Personal history of antineoplastic chemotherapy: Secondary | ICD-10-CM | POA: Diagnosis not present

## 2015-11-23 DIAGNOSIS — Z8543 Personal history of malignant neoplasm of ovary: Secondary | ICD-10-CM

## 2015-11-23 DIAGNOSIS — Z7982 Long term (current) use of aspirin: Secondary | ICD-10-CM | POA: Insufficient documentation

## 2015-11-23 DIAGNOSIS — Z950 Presence of cardiac pacemaker: Secondary | ICD-10-CM | POA: Insufficient documentation

## 2015-11-23 DIAGNOSIS — I442 Atrioventricular block, complete: Secondary | ICD-10-CM | POA: Insufficient documentation

## 2015-11-23 DIAGNOSIS — Z79899 Other long term (current) drug therapy: Secondary | ICD-10-CM | POA: Diagnosis not present

## 2015-11-23 NOTE — Progress Notes (Signed)
[facility] CONSULT NOTE  Patient Care Team: Mark F Miller, MD as PCP - General (Internal Medicine)  CHIEF COMPLAINTS/PURPOSE OF CONSULTATION:    #  Oncology History   # DEC 2013- OVARIAN CANCER- STAGE III [[facility]Jan 2014- neo-adj chemo x3 M; TAH & BSO [bil]; chemo x3M. Ca 125 -13; N  # BRCA-NEG  S/p pacemaker [Dr.Parascholes]     Ovarian cancer, unspecified laterality (HCC)   11/23/2015 Initial Diagnosis    Ovarian cancer, unspecified laterality (HCC)       HISTORY OF PRESENTING ILLNESS:  Natalie Stone 82 y.o.  female history of stage III ovarian cancer is here to establish care. Patient had more recently from [place] to be closer to family.  Patient denies any abdominal discomfort. Denies any constipation. Denies any nausea vomiting chest pain or shortness of breath or cough. Denies any tingling or numbness. Her appetite is good. No weight loss or weight gain.  Patient had recently a pacemaker placed. Otherwise doing well.  ROS: A complete 10 point review of system is done which is negative except mentioned above in history of present illness  MEDICAL HISTORY:  Past Medical History:  Diagnosis Date  . Hyperthyroidism   . Ovarian cancer (HCC) 2014  . Third degree AV block (HCC) 10/09/2015    SURGICAL HISTORY: Past Surgical History:  Procedure Laterality Date  . ABDOMINAL HYSTERECTOMY    . BLADDER REPAIR    . PACEMAKER INSERTION N/A 10/11/2015   Procedure: INSERTION PACEMAKER;  Surgeon: Alexander Paraschos, MD;  Location: [facility] ORS;  Service: Cardiovascular;  Laterality: N/A;  . 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 Marital status: Married    Spouse name: N/A  . Number of children: N/A  . Years of education: N/A   Occupational History  . Not on file.   Social History Main Topics  . Smoking status: Never  Smoker  . Smokeless tobacco: Never Used  . Alcohol use No  . Drug use: No  . Sexual activity: Not on file   Other Topics Concern  . Not on file   Social History Narrative  . No narrative on file    FAMILY HISTORY: Family History  Problem Relation Age of Onset  . Diabetes Brother     ALLERGIES:  is allergic to penicillins; ativan [lorazepam]; and benadryl [diphenhydramine].  MEDICATIONS:  Current Outpatient Prescriptions  Medication Sig Dispense Refill  . aspirin EC 81 MG tablet Take 81 mg by mouth at bedtime.    . atorvastatin (LIPITOR) 20 MG tablet Take 10 mg by mouth at bedtime.    . Bioflavonoid Products (ESTER C PO) Take 500 mg by mouth.    . Cholecalciferol (VITAMIN D3) 1000 units CAPS Take by mouth.    . Gluc-Chonn-MSM-Boswellia-Vit D (GLUCOSAMINE CHONDROITIN COMPLX) TABS Take by mouth.    . glucosamine-chondroitin 500-400 MG tablet Take 1 tablet by mouth 3 (three) times daily.    . levothyroxine (SYNTHROID, LEVOTHROID) 88 MCG tablet Take 88 mcg by mouth at bedtime.     . Multiple Vitamin (MULTIVITAMIN) tablet Take 1 tablet by mouth every evening.     No current facility-administered medications for this visit.       .  PHYSICAL EXAMINATION: ECOG PERFORMANCE STATUS: 0 - Asymptomatic  Vitals:   11/23/15 1143  BP: 117/71  Pulse: 84  Resp: 18  Temp: (!)   96.5 F (35.8 C)   Filed Weights   11/23/15 1143  Weight: 121 lb (54.9 kg)    GENERAL: Well-nourished well-developed; Alert, no distress and comfortable.   With husband.  EYES: no pallor or icterus OROPHARYNX: no thrush or ulceration; good dentition  NECK: supple, no masses felt LYMPH:  no palpable lymphadenopathy in the cervical, axillary or inguinal regions LUNGS: clear to auscultation and  No wheeze or crackles HEART/CVS: regular rate & rhythm and no murmurs; No lower extremity edema ABDOMEN: abdomen soft, non-tender and normal bowel sounds Musculoskeletal:no cyanosis of digits and no clubbing   PSYCH: alert & oriented x 3 with fluent speech NEURO: no focal motor/sensory deficits SKIN:  no rashes or significant lesions  LABORATORY DATA:  I have reviewed the data as listed Lab Results  Component Value Date   WBC 9.6 10/13/2015   HGB 14.9 10/13/2015   HCT 43.5 10/13/2015   MCV 92.9 10/13/2015   PLT 164 10/13/2015    Recent Labs  10/09/15 1935 10/10/15 0543 10/13/15 0515  NA 142 143 140  K 5.2* 3.6 4.0  CL 107 108 105  CO2 30 30 32  GLUCOSE 110* 95 108*  BUN _0 CREATININE 0.82 0.49 0.68  CALCIUM 9.1 8.5* 9.0  GFRNONAA >60 >60 >60  GFRAA >60 >60 >60  PROT 7.1  --   --   ALBUMIN 3.7  --   --   AST 41  --   --   ALT 51  --   --   ALKPHOS 68  --   --   BILITOT 0.8  --   --     RADIOGRAPHIC STUDIES: I have personally reviewed the radiological images as listed and agreed with the findings in the report. No results found.  ASSESSMENT & PLAN:   Ovarian cancer, unspecified laterality ([place]) Ovarian cancer- stage III as per patient [no records available]. Status post neoadjuvant chemotherapy followed by surgery; followed by adjuvant chemotherapy finished approximately 4 years ago. Clinically no concerns for recurrence at this time.Ca 125 today.  # Refer the patient to GYN oncology.  # I would recommend labs- cbc/cmp/ ca 125 in approximately 3 months. I left a message to discuss the above plan with the patient's granddaughter.   Thank you Dr. Sabra Heck for allowing me to participate in the care of your pleasant patient. Please do not hesitate to contact me with questions or concerns in the interim.  All questions were answered. The patient knows to call the clinic with any problems, questions or concerns.    Cammie Sickle, MD 11/23/2015 6:58 PM

## 2015-11-23 NOTE — Assessment & Plan Note (Addendum)
Ovarian cancer- stage III as per patient [no records available]. Status post neoadjuvant chemotherapy followed by surgery; followed by adjuvant chemotherapy finished approximately 4 years ago. Clinically no concerns for recurrence at this time.Ca 125 today.  # Refer the patient to GYN oncology.  # I would recommend labs- cbc/cmp/ ca 125 in approximately 3 months. I left a message to discuss the above plan with the patient's granddaughter.   Thank you Dr. Sabra Heck for allowing me to participate in the care of your pleasant patient. Please do not hesitate to contact me with questions or concerns in the interim.

## 2015-11-23 NOTE — Progress Notes (Signed)
Pacemaker placed 6 weeks ago.  Referred over by Doctor Sabra Heck.  Recently moved from [place] to here 3 month ago.

## 2015-11-24 LAB — CA 125: CA 125: 11.3 U/mL (ref 0.0–38.1)

## 2015-12-02 DIAGNOSIS — Z23 Encounter for immunization: Secondary | ICD-10-CM | POA: Diagnosis not present

## 2015-12-14 ENCOUNTER — Inpatient Hospital Stay: Payer: Commercial Managed Care - HMO

## 2015-12-14 ENCOUNTER — Inpatient Hospital Stay: Payer: Commercial Managed Care - HMO | Admitting: Oncology

## 2015-12-14 NOTE — Progress Notes (Deleted)
[facility] NOTE  Patient Care Team: Rusty Aus, MD as PCP - General (Internal Medicine)  CHIEF COMPLAINTS/PURPOSE OF CONSULTATION:    #  Oncology History   # DEC 2013- OVARIAN CANCER- STAGE III [[facility]];Jan 2014- neo-adj chemo x3 M; TAH & BSO [bil]; chemo x3M. Ca 125 -54; N  # BRCA-NEG  S/p pacemaker [Dr.Parascholes]     Ovarian cancer, unspecified laterality ([place])   11/23/2015 Initial Diagnosis    Ovarian cancer, unspecified laterality ([place])       HISTORY OF PRESENTING ILLNESS:  Natalie Stone 82 y.o.  female history of stage III ovarian cancer is here to establish care. Patient had more recently from [place] to be closer to family.  Patient denies any abdominal discomfort. Denies any constipation. Denies any nausea vomiting chest pain or shortness of breath or cough. Denies any tingling or numbness. Her appetite is good. No weight loss or weight gain.  Patient had recently a pacemaker placed. Otherwise doing well.  ROS: A complete 10 point review of system is done which is negative except mentioned above in history of present illness  MEDICAL HISTORY:  Past Medical History:  Diagnosis Date  . Hyperthyroidism   . Ovarian cancer ([place]) 2014  . Third degree AV block ([place]) 10/09/2015    SURGICAL HISTORY: Past Surgical History:  Procedure Laterality Date  . ABDOMINAL HYSTERECTOMY    . BLADDER REPAIR    . PACEMAKER INSERTION N/A 10/11/2015   Procedure: INSERTION PACEMAKER;  Surgeon: [name], MD;  Location: [facility] ORS;  Service: Cardiovascular;  Laterality: N/A;  . 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 Marital status: Married    Spouse name: N/A  . Number of children: N/A  . Years of education: N/A   Occupational History  . Not on file.   Social History Main Topics  . Smoking status: Never  Smoker  . Smokeless tobacco: Never Used  . Alcohol use No  . Drug use: No  . Sexual activity: Not on file   Other Topics Concern  . Not on file   Social History Narrative  . No narrative on file    FAMILY HISTORY: Family History  Problem Relation Age of Onset  . Diabetes Brother     ALLERGIES:  is allergic to penicillins; ativan [lorazepam]; and benadryl [diphenhydramine].  MEDICATIONS:  Current Outpatient Prescriptions  Medication Sig Dispense Refill  . aspirin EC 81 MG tablet Take 81 mg by mouth at bedtime.    Marland Kitchen atorvastatin (LIPITOR) 20 MG tablet Take 10 mg by mouth at bedtime.    Marland Kitchen Bioflavonoid Products (ESTER C PO) Take 500 mg by mouth.    . Cholecalciferol (VITAMIN D3) 1000 units CAPS Take by mouth.    . Gluc-Chonn-MSM-Boswellia-Vit D (GLUCOSAMINE CHONDROITIN COMPLX) TABS Take by mouth.    Marland Kitchen glucosamine-chondroitin 500-400 MG tablet Take 1 tablet by mouth 3 (three) times daily.    Marland Kitchen levothyroxine (SYNTHROID, LEVOTHROID) 88 MCG tablet Take 88 mcg by mouth at bedtime.     . Multiple Vitamin (MULTIVITAMIN) tablet Take 1 tablet by mouth every evening.     No current facility-administered medications for this visit.       Marland Kitchen  PHYSICAL EXAMINATION: ECOG PERFORMANCE STATUS: 0 - Asymptomatic  There were no vitals filed for this visit. There were no vitals filed for this visit.  GENERAL: Well-nourished well-developed; Alert, no distress and comfortable.   With husband.  EYES: no pallor or icterus OROPHARYNX: no thrush or ulceration; good dentition  NECK: supple, no masses felt LYMPH:  no palpable lymphadenopathy in the cervical, axillary or inguinal regions LUNGS: clear to auscultation and  No wheeze or crackles HEART/CVS: regular rate & rhythm and no murmurs; No lower extremity edema ABDOMEN: abdomen soft, non-tender and normal bowel sounds Musculoskeletal:no cyanosis of digits and no clubbing  PSYCH: alert & oriented x 3 with fluent speech NEURO: no focal  motor/sensory deficits SKIN:  no rashes or significant lesions  LABORATORY DATA:  I have reviewed the data as listed Lab Results  Component Value Date   WBC 9.6 10/13/2015   HGB 14.9 10/13/2015   HCT 43.5 10/13/2015   MCV 92.9 10/13/2015   PLT 164 10/13/2015    Recent Labs  10/09/15 1935 10/10/15 0543 10/13/15 0515  NA 142 143 140  K 5.2* 3.6 4.0  CL 107 108 105  CO2 30 30 32  GLUCOSE 110* 95 108*  BUN _0 CREATININE 0.82 0.49 0.68  CALCIUM 9.1 8.5* 9.0  GFRNONAA >60 >60 >60  GFRAA >60 >60 >60  PROT 7.1  --   --   ALBUMIN 3.7  --   --   AST 41  --   --   ALT 51  --   --   ALKPHOS 68  --   --   BILITOT 0.8  --   --     RADIOGRAPHIC STUDIES: I have personally reviewed the radiological images as listed and agreed with the findings in the report. No results found.  ASSESSMENT & PLAN:   No problem-specific Assessment & Plan notes found for this encounter.  All questions were answered. The patient knows to call the clinic with any problems, questions or concerns.    Lloyd Huger, MD 12/14/2015 12:12 AM

## 2015-12-15 ENCOUNTER — Ambulatory Visit: Payer: Commercial Managed Care - HMO | Admitting: Internal Medicine

## 2015-12-15 ENCOUNTER — Other Ambulatory Visit: Payer: Commercial Managed Care - HMO

## 2015-12-21 ENCOUNTER — Other Ambulatory Visit: Payer: Self-pay

## 2015-12-21 ENCOUNTER — Inpatient Hospital Stay: Payer: Commercial Managed Care - HMO

## 2015-12-21 ENCOUNTER — Inpatient Hospital Stay: Payer: Commercial Managed Care - HMO | Attending: Internal Medicine | Admitting: Internal Medicine

## 2015-12-21 ENCOUNTER — Encounter: Payer: Self-pay | Admitting: Obstetrics and Gynecology

## 2015-12-21 ENCOUNTER — Inpatient Hospital Stay (HOSPITAL_BASED_OUTPATIENT_CLINIC_OR_DEPARTMENT_OTHER): Payer: Commercial Managed Care - HMO | Admitting: Obstetrics and Gynecology

## 2015-12-21 VITALS — BP 115/67 | HR 86 | Temp 96.8°F | Wt 120.2 lb

## 2015-12-21 DIAGNOSIS — C569 Malignant neoplasm of unspecified ovary: Secondary | ICD-10-CM

## 2015-12-21 DIAGNOSIS — C562 Malignant neoplasm of left ovary: Secondary | ICD-10-CM

## 2015-12-21 DIAGNOSIS — C563 Malignant neoplasm of bilateral ovaries: Secondary | ICD-10-CM

## 2015-12-21 DIAGNOSIS — E039 Hypothyroidism, unspecified: Secondary | ICD-10-CM | POA: Insufficient documentation

## 2015-12-21 DIAGNOSIS — I2510 Atherosclerotic heart disease of native coronary artery without angina pectoris: Secondary | ICD-10-CM | POA: Insufficient documentation

## 2015-12-21 DIAGNOSIS — I442 Atrioventricular block, complete: Secondary | ICD-10-CM

## 2015-12-21 DIAGNOSIS — I700 Atherosclerosis of aorta: Secondary | ICD-10-CM | POA: Insufficient documentation

## 2015-12-21 DIAGNOSIS — Z79899 Other long term (current) drug therapy: Secondary | ICD-10-CM | POA: Diagnosis not present

## 2015-12-21 DIAGNOSIS — R3915 Urgency of urination: Secondary | ICD-10-CM

## 2015-12-21 DIAGNOSIS — Z9221 Personal history of antineoplastic chemotherapy: Secondary | ICD-10-CM | POA: Insufficient documentation

## 2015-12-21 DIAGNOSIS — Z90711 Acquired absence of uterus with remaining cervical stump: Secondary | ICD-10-CM

## 2015-12-21 DIAGNOSIS — Z9889 Other specified postprocedural states: Secondary | ICD-10-CM

## 2015-12-21 DIAGNOSIS — Z950 Presence of cardiac pacemaker: Secondary | ICD-10-CM

## 2015-12-21 DIAGNOSIS — Z8543 Personal history of malignant neoplasm of ovary: Secondary | ICD-10-CM

## 2015-12-21 DIAGNOSIS — C561 Malignant neoplasm of right ovary: Secondary | ICD-10-CM

## 2015-12-21 MED ORDER — MIRABEGRON ER 50 MG PO TB24
50.0000 mg | ORAL_TABLET | Freq: Every day | ORAL | 5 refills | Status: DC
Start: 2015-12-21 — End: 2017-11-27

## 2015-12-21 NOTE — Progress Notes (Signed)
[name] NOTE  Patient Care Team: Rusty Aus, MD as PCP - General (Internal Medicine)  CHIEF COMPLAINTS/PURPOSE OF CONSULTATION:    #  Oncology History   # DEC 2013- OVARIAN CANCER- STAGE III [[facility]];Jan 2014- neo-adj chemo x3 M; TAH & BSO [bil]; chemo x3M. Ca 125 -17; N  # BRCA-NEG  S/p pacemaker [Dr.Parascholes]     Malignant neoplasm of ovary ([place])   11/23/2015 Initial Diagnosis    Ovarian cancer, unspecified laterality ([place])       HISTORY OF PRESENTING ILLNESS:  Natalie Stone 82 y.o.  female history of stage III ovarian cancer Has been evaluated by gynecology oncology is morning. SHe is here for follow-up.  Patient denies any abdominal discomfort. Denies any constipation. Denies any nausea vomiting chest pain or shortness of breath or cough. Denies any tingling or numbness. Her appetite is good. No weight loss or weight gain.  Patient had recently a pacemaker placed. Otherwise doing well.  ROS: A complete 10 point review of system is done which is negative except mentioned above in history of present illness  MEDICAL HISTORY:  Past Medical History:  Diagnosis Date  . CAD (coronary artery disease)   . Hyperthyroidism   . Hypothyroidism (acquired)   . Ovarian cancer ([place]) 2014  . Third degree AV block ([place]) 10/09/2015    SURGICAL HISTORY: Past Surgical History:  Procedure Laterality Date  . ABDOMINAL HYSTERECTOMY    . BLADDER REPAIR    . PACEMAKER INSERTION N/A 10/11/2015   Procedure: INSERTION PACEMAKER;  Surgeon: [name], MD;  Location: [facility] ORS;  Service: Cardiovascular;  Laterality: N/A;  . 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 Marital status: Married    Spouse name: N/A  . Number of children: N/A  . Years of education: N/A   Occupational History  . Not on file.   Social  History Main Topics  . Smoking status: Never Smoker  . Smokeless tobacco: Never Used  . Alcohol use No  . Drug use: No  . Sexual activity: No   Other Topics Concern  . Not on file   Social History Narrative  . No narrative on file    FAMILY HISTORY: Family History  Problem Relation Age of Onset  . Diabetes Brother     ALLERGIES:  is allergic to penicillins; ativan [lorazepam]; and benadryl [diphenhydramine].  MEDICATIONS:  Current Outpatient Prescriptions  Medication Sig Dispense Refill  . atorvastatin (LIPITOR) 20 MG tablet Take 10 mg by mouth at bedtime.    Marland Kitchen Bioflavonoid Products (ESTER C PO) Take 500 mg by mouth.    . Cholecalciferol (VITAMIN D3) 1000 units CAPS Take by mouth.    . Gluc-Chonn-MSM-Boswellia-Vit D (GLUCOSAMINE CHONDROITIN COMPLX) TABS Take by mouth.    Marland Kitchen glucosamine-chondroitin 500-400 MG tablet Take 1 tablet by mouth 3 (three) times daily.    Marland Kitchen levothyroxine (SYNTHROID, LEVOTHROID) 88 MCG tablet Take 88 mcg by mouth at bedtime.     . mirabegron ER (MYRBETRIQ) 50 MG TB24 tablet Take 1 tablet (50 mg total) by mouth daily. 30 tablet 5  . Multiple Vitamin (MULTIVITAMIN) tablet Take 1 tablet by mouth every evening.    . vitamin B-12 (CYANOCOBALAMIN) 1000 MCG tablet Take 1,000 mcg by mouth daily.     No current facility-administered medications for this visit.       Marland Kitchen  PHYSICAL EXAMINATION: ECOG PERFORMANCE STATUS: 0 - Asymptomatic  There were no vitals filed for this visit. There were no vitals filed for this visit.  GENERAL: Well-nourished well-developed; Alert, no distress and comfortable.   With husband.  EYES: no pallor or icterus OROPHARYNX: no thrush or ulceration; good dentition  NECK: supple, no masses felt LYMPH:  no palpable lymphadenopathy in the cervical, axillary or inguinal regions LUNGS: clear to auscultation and  No wheeze or crackles HEART/CVS: regular rate & rhythm and no murmurs; No lower extremity edema ABDOMEN: abdomen soft,  non-tender and normal bowel sounds Musculoskeletal:no cyanosis of digits and no clubbing  PSYCH: alert & oriented x 3 with fluent speech NEURO: no focal motor/sensory deficits SKIN:  no rashes or significant lesions  LABORATORY DATA:  I have reviewed the data as listed Lab Results  Component Value Date   WBC 9.6 10/13/2015   HGB 14.9 10/13/2015   HCT 43.5 10/13/2015   MCV 92.9 10/13/2015   PLT 164 10/13/2015    Recent Labs  10/09/15 1935 10/10/15 0543 10/13/15 0515  NA 142 143 140  K 5.2* 3.6 4.0  CL 107 108 105  CO2 30 30 32  GLUCOSE 110* 95 108*  BUN 18 14 17   CREATININE 0.82 0.49 0.68  CALCIUM 9.1 8.5* 9.0  GFRNONAA >60 >60 >60  GFRAA >60 >60 >60  PROT 7.1  --   --   ALBUMIN 3.7  --   --   AST 41  --   --   ALT 51  --   --   ALKPHOS 68  --   --   BILITOT 0.8  --   --     RADIOGRAPHIC STUDIES: I have personally reviewed the radiological images as listed and agreed with the findings in the report. No results found.  ASSESSMENT & PLAN:   Malignant neoplasm of ovary ([place]) Ovarian cancer- stage III as per patient [no records available]. Status post neoadjuvant chemotherapy followed by surgery; followed by adjuvant chemotherapy finished approximately 4 years ago. Currently NED.   # will try to get records again.   # follow up with Gyn-Onc; discussed with Dr.Berchuck; follow up with me only as needed.    Cammie Sickle, MD 12/21/2015 5:50 PM

## 2015-12-21 NOTE — Assessment & Plan Note (Signed)
Ovarian cancer- stage III as per patient [no records available]. Status post neoadjuvant chemotherapy followed by surgery; followed by adjuvant chemotherapy finished approximately 4 years ago. Currently NED.   # will try to get records again.   # follow up with Gyn-Onc; discussed with Dr.Berchuck; follow up with me only as needed.

## 2015-12-21 NOTE — Progress Notes (Signed)
Gynecologic Oncology Consult Visit   Referring Provider: Dr Rogue Bussing  Chief Concern: ovarian cancer surveillance  Subjective:  Natalie Stone is a 82 y.o. female who is seen in consultation from Natalie. Rogue Bussing.  Ovarian cancer- stage III per patient [no records available]. Status post neoadjuvant chemotherapy followed by surgery with Natalie Eppie Gibson in [place]; followed by adjuvant chemotherapy finished approximately 4 years ago. Clinically no concerns for recurrence at this time.  She said she had negative genetic testing.  Here to establish care at [facility] since she moved back to this area to be near family.  She is grandmother of Natalie. Benjaman Stone.  She has seen Natalie Rogue Bussing and CA125 11/23/15=11.3  Only complaint today is of some urinary urgency.  Only very mild SUI.  No other symptoms.  She had bladder repair many years ago for SUI after having children.  Has been treated with anticholenergics in past, but mouth was very dry and had problems talking so stopped the medication.  Problem List: Patient Active Problem List   Diagnosis Date Noted  . Malignant neoplasm of both ovaries ([place]) 11/23/2015  . Acquired hypothyroidism 10/26/2015  . Aortic atherosclerosis ([place]) 10/26/2015  . Cardiac pacemaker 10/26/2015  . Chemotherapy-induced neuropathy ([place]) 10/26/2015  . Coronary artery disease involving native coronary artery of native heart without angina pectoris 10/26/2015  . Pacemaker pocket hematoma 10/26/2015  . Status post placement of cardiac pacemaker 10/26/2015  . Bradycardia 10/09/2015  . Third degree AV block ([place]) 10/09/2015    Past Medical History: Past Medical History:  Diagnosis Date  . CAD (coronary artery disease)   . Hyperthyroidism   . Hypothyroidism (acquired)   . Ovarian cancer ([place]) 2014  . Third degree AV block ([place]) 10/09/2015    Past Surgical History: Past Surgical History:  Procedure Laterality Date  . ABDOMINAL HYSTERECTOMY    . BLADDER REPAIR    .  PACEMAKER INSERTION N/A 10/11/2015   Procedure: INSERTION PACEMAKER;  Surgeon: [name], MD;  Location: [facility] ORS;  Service: Cardiovascular;  Laterality: N/A;  . PARTIAL HYSTERECTOMY       Family History: Family History  Problem Relation Age of Onset  . Diabetes Brother     Social History: Social History   Social History  . Marital status: Married    Spouse name: N/A  . Number of children: N/A  . Years of education: N/A   Occupational History  . Not on file.   Social History Main Topics  . Smoking status: Never Smoker  . Smokeless tobacco: Never Used  . Alcohol use No  . Drug use: No  . Sexual activity: No   Other Topics Concern  . Not on file   Social History Narrative  . No narrative on file    Allergies: Allergies  Allergen Reactions  . Penicillins Anaphylaxis    Has patient had a PCN reaction causing immediate rash, facial/tongue/throat swelling, SOB or lightheadedness with hypotension: Yes Has patient had a PCN reaction causing severe rash involving mucus membranes or skin necrosis: No Has patient had a PCN reaction that required hospitalization No Has patient had a PCN reaction occurring within the last 10 years: No If all of the above answers are "NO", then may proceed with Cephalosporin use.  . Ativan [Lorazepam] Other (See Comments)    Made her not remember for many hours.  . Benadryl [Diphenhydramine] Other (See Comments)    Made her sleep for hours.    Current Medications: Current Outpatient Prescriptions  Medication Sig Dispense  Refill  . atorvastatin (LIPITOR) 20 MG tablet Take 10 mg by mouth at bedtime.    Marland Kitchen Bioflavonoid Products (ESTER C PO) Take 500 mg by mouth.    . Cholecalciferol (VITAMIN D3) 1000 units CAPS Take by mouth.    . Gluc-Chonn-MSM-Boswellia-Vit D (GLUCOSAMINE CHONDROITIN COMPLX) TABS Take by mouth.    Marland Kitchen glucosamine-chondroitin 500-400 MG tablet Take 1 tablet by mouth 3 (three) times daily.    Marland Kitchen levothyroxine  (SYNTHROID, LEVOTHROID) 88 MCG tablet Take 88 mcg by mouth at bedtime.     . Multiple Vitamin (MULTIVITAMIN) tablet Take 1 tablet by mouth every evening.    . vitamin B-12 (CYANOCOBALAMIN) 1000 MCG tablet Take 1,000 mcg by mouth daily.     No current facility-administered medications for this visit.     Review of Systems General: negative for, fevers, chills, fatigue, changes in sleep, changes in weight or appetite Skin: negative for changes in color, texture, moles or lesions Eyes: negative for, changes in vision, pain, diplopia HEENT: negative for, change in hearing, pain, discharge, tinnitus, vertigo, voice changes, sore throat, neck masses Breasts: negative for breast lumps Pulmonary: negative for, dyspnea, orthopnea, productive cough Cardiac: negative for, palpitations, syncope, pain, discomfort, pressure Gastrointestinal: negative for, dysphagia, nausea, vomiting, jaundice, pain, constipation, diarrhea, hematemesis, hematochezia Genitourinary/Sexual: negative for, dysuria, discharge, hesitancy, nocturia, retention, stones, infections, STD's, incontinence Ob/Gyn: negative for, irregular bleeding, pain Musculoskeletal: negative for, pain, stiffness, swelling, range of motion limitation Hematology: No coagulation disorder, negative for, easy bruising, bleeding Neurologic/Psych: negative for, headaches, seizures, paralysis, weakness, tremor, change in gait, change in sensation, mood swings, depression, anxiety, change in memory  Objective:  Physical Examination:  BP 115/67 (BP Location: Left Arm, Patient Position: Sitting)   Pulse 86   Temp (!) 96.8 F (36 C) (Tympanic)   Wt 120 lb 2.4 oz (54.5 kg)   BMI 21.98 kg/m    ECOG Performance Status: 0 - Asymptomatic  General appearance: alert, cooperative and appears stated age HEENT:PERRLA and neck supple with midline trachea Lymph node survey: non-palpable, axillary, inguinal, supraclavicular Cardiovascular: regular rate and  rhythm, no murmurs or gallops Respiratory: normal air entry, lungs clear to auscultation and no rales, rhonchi or wheezing Breast exam: not examined. Abdomen: soft, non-tender, without masses or organomegaly, no hernias and well healed incision Back: inspection of back is normal Extremities: extremities normal, atraumatic, no cyanosis or edema Skin exam - normal coloration and turgor, no rashes, no suspicious skin lesions noted. Neurological exam reveals alert, oriented, normal speech, no focal findings or movement disorder noted.  Pelvic: exam chaperoned by nurse;  Vulva: normal appearing vulva with no masses, tenderness or lesions; Vagina: normal vagina; Adnexa: surgically absent bilateral; Rectal: normal    Assessment:  Nitza Bacik is a 82 y.o. female diagnosed with advanced ovarian cancer treated with neoadjuvant chemo and interval debulking in 2013 in [place].  NED   Urge incontinence s/p surgery for SUI in the past.   Plan:   Problem List Items Addressed This Visit      Genitourinary   Malignant neoplasm of ovary ([place]) - Primary   Relevant Medications   mirabegron ER (MYRBETRIQ) 50 MG TB24 tablet    Other Visit Diagnoses   None.    We discussed need for ovarian cancer surveillance and we can do this and refer back to Natalie. Rogue Bussing if she has a recurrence. She will return to see [place] in 4 months for follow up.    I spoke to Natalie Cheryl Flash in Urogynecology and  she suggested Mirabegron 50 mg qd (a B-agonist) for urge incontinence.  Symptoms to watch for include HTN and rhinitis.  If this does not help, she can see Natalie Sharlett Iles who can suggest other options including Botox injections.   The patient's diagnosis, an outline of the further diagnostic and laboratory studies which will be required, the recommendation, and alternatives were discussed.  All questions were answered to the patient's satisfaction.  A total of 30 minutes were spent with the patient/family today; 25% was  spent in education, counseling and coordination of care for ovarian cancer and urge incontinence.    Mellody Drown, MD  CC:  Rusty Aus, MD [facility] [address] [place], [place] [address] [phone]

## 2015-12-22 LAB — CA 125: CA 125: 10.3 U/mL (ref 0.0–38.1)

## 2016-01-20 DIAGNOSIS — M6281 Muscle weakness (generalized): Secondary | ICD-10-CM | POA: Diagnosis not present

## 2016-01-20 DIAGNOSIS — M542 Cervicalgia: Secondary | ICD-10-CM | POA: Diagnosis not present

## 2016-01-20 DIAGNOSIS — R293 Abnormal posture: Secondary | ICD-10-CM | POA: Diagnosis not present

## 2016-01-20 DIAGNOSIS — M25512 Pain in left shoulder: Secondary | ICD-10-CM | POA: Diagnosis not present

## 2016-01-20 DIAGNOSIS — M25511 Pain in right shoulder: Secondary | ICD-10-CM | POA: Diagnosis not present

## 2016-01-26 DIAGNOSIS — R293 Abnormal posture: Secondary | ICD-10-CM | POA: Diagnosis not present

## 2016-01-26 DIAGNOSIS — M6281 Muscle weakness (generalized): Secondary | ICD-10-CM | POA: Diagnosis not present

## 2016-01-26 DIAGNOSIS — M25512 Pain in left shoulder: Secondary | ICD-10-CM | POA: Diagnosis not present

## 2016-01-26 DIAGNOSIS — M25511 Pain in right shoulder: Secondary | ICD-10-CM | POA: Diagnosis not present

## 2016-01-26 DIAGNOSIS — M542 Cervicalgia: Secondary | ICD-10-CM | POA: Diagnosis not present

## 2016-01-31 DIAGNOSIS — R293 Abnormal posture: Secondary | ICD-10-CM | POA: Diagnosis not present

## 2016-01-31 DIAGNOSIS — M25511 Pain in right shoulder: Secondary | ICD-10-CM | POA: Diagnosis not present

## 2016-01-31 DIAGNOSIS — M542 Cervicalgia: Secondary | ICD-10-CM | POA: Diagnosis not present

## 2016-01-31 DIAGNOSIS — M6281 Muscle weakness (generalized): Secondary | ICD-10-CM | POA: Diagnosis not present

## 2016-01-31 DIAGNOSIS — M25512 Pain in left shoulder: Secondary | ICD-10-CM | POA: Diagnosis not present

## 2016-02-08 DIAGNOSIS — I2510 Atherosclerotic heart disease of native coronary artery without angina pectoris: Secondary | ICD-10-CM | POA: Diagnosis not present

## 2016-02-08 DIAGNOSIS — I700 Atherosclerosis of aorta: Secondary | ICD-10-CM | POA: Diagnosis not present

## 2016-02-08 DIAGNOSIS — Z950 Presence of cardiac pacemaker: Secondary | ICD-10-CM | POA: Diagnosis not present

## 2016-04-25 ENCOUNTER — Inpatient Hospital Stay: Payer: Medicare PPO

## 2016-04-25 ENCOUNTER — Inpatient Hospital Stay: Payer: Medicare PPO | Attending: Obstetrics and Gynecology | Admitting: Obstetrics and Gynecology

## 2016-04-25 VITALS — BP 110/62 | HR 100 | Temp 97.8°F | Resp 18 | Ht 62.0 in | Wt 122.6 lb

## 2016-04-25 DIAGNOSIS — Z8543 Personal history of malignant neoplasm of ovary: Secondary | ICD-10-CM

## 2016-04-25 DIAGNOSIS — Z90710 Acquired absence of both cervix and uterus: Secondary | ICD-10-CM | POA: Diagnosis not present

## 2016-04-25 DIAGNOSIS — E1122 Type 2 diabetes mellitus with diabetic chronic kidney disease: Secondary | ICD-10-CM

## 2016-04-25 DIAGNOSIS — Z7984 Long term (current) use of oral hypoglycemic drugs: Secondary | ICD-10-CM | POA: Diagnosis not present

## 2016-04-25 DIAGNOSIS — Z950 Presence of cardiac pacemaker: Secondary | ICD-10-CM | POA: Diagnosis not present

## 2016-04-25 DIAGNOSIS — I700 Atherosclerosis of aorta: Secondary | ICD-10-CM

## 2016-04-25 DIAGNOSIS — I2510 Atherosclerotic heart disease of native coronary artery without angina pectoris: Secondary | ICD-10-CM | POA: Diagnosis not present

## 2016-04-25 DIAGNOSIS — N189 Chronic kidney disease, unspecified: Secondary | ICD-10-CM

## 2016-04-25 DIAGNOSIS — I442 Atrioventricular block, complete: Secondary | ICD-10-CM | POA: Diagnosis not present

## 2016-04-25 DIAGNOSIS — C569 Malignant neoplasm of unspecified ovary: Secondary | ICD-10-CM

## 2016-04-25 DIAGNOSIS — R001 Bradycardia, unspecified: Secondary | ICD-10-CM

## 2016-04-25 DIAGNOSIS — N3941 Urge incontinence: Secondary | ICD-10-CM | POA: Diagnosis not present

## 2016-04-25 DIAGNOSIS — E039 Hypothyroidism, unspecified: Secondary | ICD-10-CM

## 2016-04-25 DIAGNOSIS — Z7982 Long term (current) use of aspirin: Secondary | ICD-10-CM | POA: Diagnosis not present

## 2016-04-25 DIAGNOSIS — Z9221 Personal history of antineoplastic chemotherapy: Secondary | ICD-10-CM

## 2016-04-25 DIAGNOSIS — Z79899 Other long term (current) drug therapy: Secondary | ICD-10-CM | POA: Diagnosis not present

## 2016-04-25 NOTE — Progress Notes (Signed)
Pt for f/u and no complaints for gyn.

## 2016-04-25 NOTE — Progress Notes (Addendum)
Gynecologic Oncology Consult Visit   Referring Provider: Dr Rogue Bussing  Chief Concern: ovarian cancer surveillance  Subjective:  Natalie Stone is a 83 y.o. female seen in consultation from Dr. Rogue Bussing for follow up of ovarian cancer.   Oncology History Ovarian cancer- stage III per patient [no records available]. Status post neoadjuvant chemotherapy followed by surgery with Dr Eppie Gibson in [place] in 2013; followed by adjuvant chemotherapy. Clinically no concerns for recurrence at this time.  She said she had negative genetic testing.  Establish care at [facility] in 2017 since she moved back to this area to be near family.  She is grandmother of Dr. Benjaman Kindler.  She has seen Dr Rogue Bussing and CA125 12/21/15=11.3  Mild SUI.  No other symptoms.  She had bladder repair many years ago for SUI after having children.  Has been treated with anticholenergics in past, but mouth was very dry and had problems talking so stopped the medication.  Now uses Mirabegron 50 mg qd (a B-agonist) for urge incontinence per Dr Buel Ream suggestion.  No symptoms since she started this medication.   Problem List: Patient Active Problem List   Diagnosis Date Noted  . Malignant neoplasm of ovary ([place]) 11/23/2015  . Acquired hypothyroidism 10/26/2015  . Aortic atherosclerosis ([place]) 10/26/2015  . Cardiac pacemaker 10/26/2015  . Chemotherapy-induced neuropathy ([place]) 10/26/2015  . Coronary artery disease involving native coronary artery of native heart without angina pectoris 10/26/2015  . Pacemaker pocket hematoma 10/26/2015  . Status post placement of cardiac pacemaker 10/26/2015  . Bradycardia 10/09/2015  . Third degree AV block ([place]) 10/09/2015    Past Medical History: Past Medical History:  Diagnosis Date  . CAD (coronary artery disease)   . Chronic kidney disease    urinary incontinence  . Hyperthyroidism   . Hypothyroidism (acquired)   . Ovarian cancer ([place]) 2014  . Third degree AV block ([place])  10/09/2015    Past Surgical History: Past Surgical History:  Procedure Laterality Date  . ABDOMINAL HYSTERECTOMY    . BLADDER REPAIR    . PACEMAKER INSERTION N/A 10/11/2015   Procedure: INSERTION PACEMAKER;  Surgeon: [name], MD;  Location: [facility] ORS;  Service: Cardiovascular;  Laterality: N/A;  . PARTIAL HYSTERECTOMY       Family History: Family History  Problem Relation Age of Onset  . Diabetes Brother     Social History: Social History   Social History  . Marital status: Married    Spouse name: N/A  . Number of children: N/A  . Years of education: N/A   Occupational History  . Not on file.   Social History Main Topics  . Smoking status: Never Smoker  . Smokeless tobacco: Never Used  . Alcohol use No  . Drug use: No  . Sexual activity: No   Other Topics Concern  . Not on file   Social History Narrative  . No narrative on file    Allergies: Allergies  Allergen Reactions  . Penicillins Anaphylaxis    Has patient had a PCN reaction causing immediate rash, facial/tongue/throat swelling, SOB or lightheadedness with hypotension: Yes Has patient had a PCN reaction causing severe rash involving mucus membranes or skin necrosis: No Has patient had a PCN reaction that required hospitalization No Has patient had a PCN reaction occurring within the last 10 years: No If all of the above answers are "NO", then may proceed with Cephalosporin use.  . Ativan [Lorazepam] Other (See Comments)    Made her not remember for many hours.  Marland Kitchen  Benadryl [Diphenhydramine] Other (See Comments)    Made her sleep for hours.    Current Medications: Current Outpatient Prescriptions  Medication Sig Dispense Refill  . aspirin EC 81 MG tablet Take 81 mg by mouth daily.    Marland Kitchen atorvastatin (LIPITOR) 20 MG tablet Take 10 mg by mouth at bedtime.    Marland Kitchen Bioflavonoid Products (ESTER C PO) Take 500 mg by mouth.    . Cholecalciferol (VITAMIN D3) 1000 units CAPS Take by mouth.    Marland Kitchen  glucosamine-chondroitin 500-400 MG tablet Take 1 tablet by mouth 3 (three) times daily.    Marland Kitchen levothyroxine (SYNTHROID, LEVOTHROID) 88 MCG tablet Take 88 mcg by mouth at bedtime.     . mirabegron ER (MYRBETRIQ) 50 MG TB24 tablet Take 1 tablet (50 mg total) by mouth daily. 30 tablet 5  . Multiple Vitamin (MULTIVITAMIN) tablet Take 1 tablet by mouth every evening.    . vitamin B-12 (CYANOCOBALAMIN) 1000 MCG tablet Take 1,000 mcg by mouth daily.     No current facility-administered medications for this visit.     Review of Systems General: negative for, fevers, chills, fatigue, changes in sleep, changes in weight or appetite Skin: negative for changes in color, texture, moles or lesions Eyes: negative for, changes in vision, pain, diplopia HEENT: negative for, change in hearing, pain, discharge, tinnitus, vertigo, voice changes, sore throat, neck masses Breasts: negative for breast lumps Pulmonary: negative for, dyspnea, orthopnea, productive cough Cardiac: negative for, palpitations, syncope, pain, discomfort, pressure Gastrointestinal: negative for, dysphagia, nausea, vomiting, jaundice, pain, constipation, diarrhea, hematemesis, hematochezia Genitourinary/Sexual: negative for, dysuria, discharge, hesitancy, nocturia, retention, stones, infections, STD's, incontinence Ob/Gyn: negative for, irregular bleeding, pain Musculoskeletal: negative for, pain, stiffness, swelling, range of motion limitation Hematology: No coagulation disorder, negative for, easy bruising, bleeding Neurologic/Psych: negative for, headaches, seizures, paralysis, weakness, tremor, change in gait, change in sensation, mood swings, depression, anxiety, change in memory  Objective:  Physical Examination:  BP 110/62   Pulse 100   Temp 97.8 F (36.6 C) (Oral)   Resp 18   Ht 5\' 2"  (1.575 m)   Wt 122 lb 9.2 oz (55.6 kg)   BMI 22.42 kg/m    ECOG Performance Status: 0 - Asymptomatic  General appearance: alert,  cooperative and appears stated age HEENT:PERRLA and neck supple with midline trachea Lymph node survey: non-palpable, axillary, inguinal, supraclavicular Cardiovascular: regular rate and rhythm, no murmurs or gallops Respiratory: normal air entry, lungs clear to auscultation and no rales, rhonchi or wheezing Breast exam: not examined. Abdomen: soft, non-tender, without masses or organomegaly, no hernias and well healed incision Back: inspection of back is normal Extremities: extremities normal, atraumatic, no cyanosis or edema Skin exam - normal coloration and turgor, no rashes, no suspicious skin lesions noted. Neurological exam reveals alert, oriented, normal speech, no focal findings or movement disorder noted.  Pelvic: exam chaperoned by nurse;  Vulva: normal appearing vulva with no masses, tenderness or lesions; Vagina: normal vagina; Adnexa: surgically absent bilateral; Rectal: normal    Assessment:  Natalie Stone is a 83 y.o. female diagnosed with advanced ovarian cancer treated with neoadjuvant chemo and interval debulking in 2013 in [place].  NED   Urge incontinence s/p surgery for SUI in the past.  Now uses Mirabegron 50 mg qd (a B-agonist) for urge incontinence per Dr Buel Ream suggestion with good results and no symptoms.   Plan:   Problem List Items Addressed This Visit    None    Visit Diagnoses    Ovarian cancer,  unspecified laterality ([place])    -  Primary   Relevant Medications   aspirin EC 81 MG tablet     Will repeat CA125 today and she will RTC in 6 months if normal.  Can return sooner if new concerning symptoms.     Renewed Mirabegron 50 mg qd (a B-agonist) that she uses for urge incontinence per Dr Buel Ream suggestion.  The patient's diagnosis, an outline of the further diagnostic and laboratory studies which will be required, the recommendation, and alternatives were discussed.  All questions were answered to the patient's satisfaction.    Mellody Drown,  MD  CC:  Rusty Aus, MD [facility] [place], [place] [address] [phone]

## 2016-04-25 NOTE — Progress Notes (Signed)
  Oncology Nurse Navigator Documentation Chaperoned pelvic exam. For CA125 today. Navigator Location: [facility] (04/25/16 1400)   )Navigator Encounter Type: Follow-up Appt (04/25/16 1400)                                                    Time Spent with Patient: 15 (04/25/16 1400)

## 2016-04-26 LAB — CA 125: CA 125: 13.8 U/mL (ref 0.0–38.1)

## 2016-05-02 ENCOUNTER — Telehealth: Payer: Self-pay

## 2016-05-02 NOTE — Telephone Encounter (Signed)
  Oncology Nurse Navigator Documentation Notified of Ca 125 13.8. Within normal limits . Navigator Location: [facility] (05/02/16 1100)   )Navigator Encounter Type: Telephone;Diagnostic Results (05/02/16 1100)                                                    Time Spent with Patient: 15 (05/02/16 1100)

## 2016-10-31 ENCOUNTER — Inpatient Hospital Stay: Payer: Medicare PPO

## 2016-11-05 DIAGNOSIS — Z0000 Encounter for general adult medical examination without abnormal findings: Secondary | ICD-10-CM | POA: Insufficient documentation

## 2016-11-05 DIAGNOSIS — M818 Other osteoporosis without current pathological fracture: Secondary | ICD-10-CM | POA: Insufficient documentation

## 2016-11-21 ENCOUNTER — Ambulatory Visit: Payer: Medicare PPO

## 2016-12-19 ENCOUNTER — Inpatient Hospital Stay: Payer: Medicare PPO | Attending: Obstetrics and Gynecology | Admitting: Obstetrics and Gynecology

## 2016-12-19 ENCOUNTER — Encounter: Payer: Self-pay | Admitting: Obstetrics and Gynecology

## 2016-12-19 ENCOUNTER — Inpatient Hospital Stay: Payer: Medicare PPO

## 2016-12-19 VITALS — BP 123/76 | HR 92 | Temp 98.2°F | Resp 18 | Ht 62.0 in | Wt 116.7 lb

## 2016-12-19 DIAGNOSIS — Z950 Presence of cardiac pacemaker: Secondary | ICD-10-CM

## 2016-12-19 DIAGNOSIS — Z8543 Personal history of malignant neoplasm of ovary: Secondary | ICD-10-CM | POA: Diagnosis present

## 2016-12-19 DIAGNOSIS — I2510 Atherosclerotic heart disease of native coronary artery without angina pectoris: Secondary | ICD-10-CM

## 2016-12-19 DIAGNOSIS — Z9221 Personal history of antineoplastic chemotherapy: Secondary | ICD-10-CM

## 2016-12-19 DIAGNOSIS — Z7982 Long term (current) use of aspirin: Secondary | ICD-10-CM

## 2016-12-19 DIAGNOSIS — E039 Hypothyroidism, unspecified: Secondary | ICD-10-CM

## 2016-12-19 DIAGNOSIS — C569 Malignant neoplasm of unspecified ovary: Secondary | ICD-10-CM

## 2016-12-19 DIAGNOSIS — N189 Chronic kidney disease, unspecified: Secondary | ICD-10-CM

## 2016-12-19 DIAGNOSIS — Z79899 Other long term (current) drug therapy: Secondary | ICD-10-CM | POA: Diagnosis not present

## 2016-12-19 NOTE — Progress Notes (Signed)
Gynecologic Oncology Consult Visit   Referring Provider: Dr Rogue Bussing  Chief Concern: ovarian cancer surveillance  Subjective:  Natalie Stone is a 83 y.o. female seen in consultation from Dr. Rogue Bussing for follow up of ovarian cancer.   Oncology History Ovarian cancer- stage III per patient [no records available]. Status post neoadjuvant chemotherapy followed by surgery with Dr Eppie Gibson in [place] in 2013; followed by adjuvant chemotherapy. Clinically no concerns for recurrence at this time.  She said she had negative genetic testing.  Establish care at [facility] in 2017 since she moved back to this area to be near family.  She is grandmother of Dr. Benjaman Kindler.  CA125 3/18 =13.7  Mild SUI.  No other symptoms.  She had bladder repair many years ago for SUI after having children.  Has been treated with anticholenergics in past, but mouth was very dry and had problems talking so stopped the medication.  Now uses Mirabegron 50 mg qd (a B-agonist) for urge incontinence per Dr Buel Ream suggestion.  No symptoms since she started this medication.   Problem List: Patient Active Problem List   Diagnosis Date Noted  . Malignant neoplasm of ovary ([place]) 11/23/2015  . Acquired hypothyroidism 10/26/2015  . Aortic atherosclerosis ([place]) 10/26/2015  . Cardiac pacemaker 10/26/2015  . Chemotherapy-induced neuropathy ([place]) 10/26/2015  . Coronary artery disease involving native coronary artery of native heart without angina pectoris 10/26/2015  . Pacemaker pocket hematoma 10/26/2015  . Status post placement of cardiac pacemaker 10/26/2015  . Bradycardia 10/09/2015  . Third degree AV block ([place]) 10/09/2015    Past Medical History: Past Medical History:  Diagnosis Date  . CAD (coronary artery disease)   . Chronic kidney disease    urinary incontinence  . Hyperthyroidism   . Hypothyroidism (acquired)   . Ovarian cancer ([place]) 2014  . Shingles   . Third degree AV block ([place]) 10/09/2015    Past  Surgical History: Past Surgical History:  Procedure Laterality Date  . ABDOMINAL HYSTERECTOMY    . BLADDER REPAIR    . PACEMAKER INSERTION N/A 10/11/2015   Procedure: INSERTION PACEMAKER;  Surgeon: [name], MD;  Location: [facility] ORS;  Service: Cardiovascular;  Laterality: N/A;  . PARTIAL HYSTERECTOMY       Family History: Family History  Problem Relation Age of Onset  . Diabetes Brother     Social History: Social History   Social History  . Marital status: Married    Spouse name: N/A  . Number of children: N/A  . Years of education: N/A   Occupational History  . Not on file.   Social History Main Topics  . Smoking status: Never Smoker  . Smokeless tobacco: Never Used  . Alcohol use No  . Drug use: No  . Sexual activity: No   Other Topics Concern  . Not on file   Social History Narrative  . No narrative on file    Allergies: Allergies  Allergen Reactions  . Penicillins Anaphylaxis    Has patient had a PCN reaction causing immediate rash, facial/tongue/throat swelling, SOB or lightheadedness with hypotension: Yes Has patient had a PCN reaction causing severe rash involving mucus membranes or skin necrosis: No Has patient had a PCN reaction that required hospitalization No Has patient had a PCN reaction occurring within the last 10 years: No If all of the above answers are "NO", then may proceed with Cephalosporin use.  . Ativan [Lorazepam] Other (See Comments)    Made her not remember for many hours.  Marland Kitchen  Benadryl [Diphenhydramine] Other (See Comments)    Made her sleep for hours.    Current Medications: Current Outpatient Prescriptions  Medication Sig Dispense Refill  . aspirin EC 81 MG tablet Take 81 mg by mouth daily.    Marland Kitchen atorvastatin (LIPITOR) 20 MG tablet Take 10 mg by mouth at bedtime.    Marland Kitchen b complex vitamins tablet Take 1 tablet by mouth daily.    Marland Kitchen Bioflavonoid Products (ESTER C PO) Take 500 mg by mouth daily.     . Cholecalciferol  (VITAMIN D3) 1000 units CAPS Take by mouth daily.     Marland Kitchen gabapentin (NEURONTIN) 100 MG capsule Take 100 mg by mouth 2 (two) times daily. 2 pills in am and 4 pills at night    . glucosamine-chondroitin 500-400 MG tablet Take 1 tablet by mouth 2 (two) times daily.     Marland Kitchen levothyroxine (SYNTHROID, LEVOTHROID) 88 MCG tablet Take 88 mcg by mouth at bedtime.     . mirabegron ER (MYRBETRIQ) 50 MG TB24 tablet Take 1 tablet (50 mg total) by mouth daily. 30 tablet 5   No current facility-administered medications for this visit.     Review of Systems General: negative for, fevers, chills, fatigue, changes in sleep, changes in weight or appetite Skin: negative for changes in color, texture, moles or lesions Eyes: negative for, changes in vision, pain, diplopia HEENT: negative for, change in hearing, pain, discharge, tinnitus, vertigo, voice changes, sore throat, neck masses Breasts: negative for breast lumps Pulmonary: negative for, dyspnea, orthopnea, productive cough Cardiac: negative for, palpitations, syncope, pain, discomfort, pressure Gastrointestinal: negative for, dysphagia, nausea, vomiting, jaundice, pain, constipation, diarrhea, hematemesis, hematochezia Genitourinary/Sexual: negative for, dysuria, discharge, hesitancy, nocturia, retention, stones, infections, STD's, incontinence Ob/Gyn: negative for, irregular bleeding, pain Musculoskeletal: negative for, pain, stiffness, swelling, range of motion limitation Hematology: No coagulation disorder, negative for, easy bruising, bleeding Neurologic/Psych: negative for, headaches, seizures, paralysis, weakness, tremor, change in gait, change in sensation, mood swings, depression, anxiety, change in memory  Objective:  Physical Examination:  BP 123/76   Pulse 92   Temp 98.2 F (36.8 C) (Tympanic)   Resp 18   Ht 5' 2"  (1.575 m)   Wt 116 lb 11.2 oz (52.9 kg)   BMI 21.34 kg/m    ECOG Performance Status: 0 - Asymptomatic  General appearance:  alert, cooperative and appears stated age HEENT:PERRLA and neck supple with midline trachea Lymph node survey: non-palpable, axillary, inguinal, supraclavicular Cardiovascular: regular rate and rhythm, no murmurs or gallops Respiratory: normal air entry, lungs clear to auscultation and no rales, rhonchi or wheezing Breast exam: not examined. Abdomen: soft, non-tender, without masses or organomegaly, no hernias and well healed incision Back: inspection of back is normal Extremities: extremities normal, atraumatic, no cyanosis or edema Skin exam - normal coloration and turgor, no rashes, no suspicious skin lesions noted. Neurological exam reveals alert, oriented, normal speech, no focal findings or movement disorder noted.  Pelvic: exam chaperoned by nurse;  Vulva: normal appearing vulva with no masses, tenderness or lesions; Vagina: normal vagina; Adnexa: surgically absent bilateral; Rectal: normal    Assessment:  Kamika Goodloe is a 83 y.o. female diagnosed with advanced ovarian cancer treated with neoadjuvant chemo and interval debulking in 2013 in [place].  NED   Urge incontinence s/p surgery for SUI in the past.  Now uses Mirabegron 50 mg qd (a B-agonist) for urge incontinence per Dr Buel Ream suggestion with good results and no symptoms.   Plan:   Problem List Items Addressed This  Visit      Genitourinary   Malignant neoplasm of ovary ([place]) - Primary   Relevant Orders   CA 125   CA 125     Will repeat CA125 today and she will RTC in 6 months if normal.  Can return sooner if new concerning symptoms.     Renewed Mirabegron 50 mg qd (a B-agonist) that she uses for urge incontinence per Dr Buel Ream suggestion.  The patient's diagnosis, an outline of the further diagnostic and laboratory studies which will be required, the recommendation, and alternatives were discussed.  All questions were answered to the patient's satisfaction.  Asked patient and daughter to get a copy of her  genetic testing results and to get these to my nurse Mariea Clonts.  She thinks she had negative testing for OFHQ1/9, but is not certain.     Mellody Drown, MD  CC:  Rusty Aus, MD [facility] [address] [place], [place] [address] [phone]

## 2016-12-19 NOTE — Progress Notes (Signed)
  Oncology Nurse Navigator Documentation Chaperoned pelvic exam. Ca125 today and in 6 months Navigator Location: [facility] (12/19/16 1600)   )Navigator Encounter Type: Follow-up Appt (12/19/16 1600)                     Patient Visit Type: GynOnc (12/19/16 1600)                              Time Spent with Patient: 15 (12/19/16 1600)

## 2016-12-19 NOTE — Progress Notes (Signed)
Pt has no gyn concerns, she does still have shingle pain on right arm and shoulder and to her back

## 2016-12-20 LAB — CA 125: CANCER ANTIGEN (CA) 125: 33.3 U/mL (ref 0.0–38.1)

## 2016-12-27 ENCOUNTER — Telehealth: Payer: Self-pay

## 2016-12-27 DIAGNOSIS — C569 Malignant neoplasm of unspecified ovary: Secondary | ICD-10-CM

## 2016-12-27 NOTE — Telephone Encounter (Signed)
  Oncology Nurse Navigator Documentation Called and notified Natalie Stone of rising CA 125. While it is WNL, it is almost tripled what she normally runs. Per. Dr. Fransisca Connors we will repeat CA 125 in 4-6 weeks. If it continues to rise he would like to perform a CT scan. Lab appointment arranged for 12/4 at 1400. Dr. Fransisca Connors will be available 12/5 to view the results. Navigator Location: [facility] (12/27/16 1000)   )Navigator Encounter Type: Telephone;Diagnostic Results (12/27/16 1000)                     Patient Visit Type: GynOnc (12/27/16 1000)                              Time Spent with Patient: 15 (12/27/16 1000)

## 2017-01-16 ENCOUNTER — Telehealth: Payer: Self-pay

## 2017-01-16 NOTE — Telephone Encounter (Signed)
  Oncology Nurse Navigator Documentation Dr. Maryellen Pile office returned call. No genetic testing has been performed on Natalie Stone. Navigator Location: [facility] (01/16/17 1700)   )Navigator Encounter Type: Telephone (01/16/17 1700)                                                    Time Spent with Patient: 15 (01/16/17 1700)

## 2017-01-16 NOTE — Telephone Encounter (Signed)
  Oncology Nurse Navigator Documentation Voicemail with Dr. Maryellen Pile team at [facility] in [place], [place] to return call. Inquiring to check and see if genetic testing has been performed. It is not noted in records sent. Navigator Location: [facility] (01/16/17 1200)   )Navigator Encounter Type: Telephone (01/16/17 1200) Telephone: [place] Call (01/16/17 1200)                                                  Time Spent with Patient: 15 (01/16/17 1200)

## 2017-01-22 ENCOUNTER — Inpatient Hospital Stay: Payer: Medicare PPO | Attending: Obstetrics and Gynecology

## 2017-01-22 DIAGNOSIS — E039 Hypothyroidism, unspecified: Secondary | ICD-10-CM | POA: Insufficient documentation

## 2017-01-22 DIAGNOSIS — Z79899 Other long term (current) drug therapy: Secondary | ICD-10-CM | POA: Insufficient documentation

## 2017-01-22 DIAGNOSIS — N189 Chronic kidney disease, unspecified: Secondary | ICD-10-CM | POA: Insufficient documentation

## 2017-01-22 DIAGNOSIS — Z7982 Long term (current) use of aspirin: Secondary | ICD-10-CM | POA: Insufficient documentation

## 2017-01-22 DIAGNOSIS — Z950 Presence of cardiac pacemaker: Secondary | ICD-10-CM | POA: Insufficient documentation

## 2017-01-22 DIAGNOSIS — Z9221 Personal history of antineoplastic chemotherapy: Secondary | ICD-10-CM | POA: Diagnosis not present

## 2017-01-22 DIAGNOSIS — I2510 Atherosclerotic heart disease of native coronary artery without angina pectoris: Secondary | ICD-10-CM | POA: Diagnosis not present

## 2017-01-22 DIAGNOSIS — Z8543 Personal history of malignant neoplasm of ovary: Secondary | ICD-10-CM | POA: Diagnosis present

## 2017-01-22 DIAGNOSIS — C569 Malignant neoplasm of unspecified ovary: Secondary | ICD-10-CM

## 2017-01-23 ENCOUNTER — Other Ambulatory Visit: Payer: Self-pay | Admitting: Nurse Practitioner

## 2017-01-23 ENCOUNTER — Inpatient Hospital Stay: Payer: Medicare PPO

## 2017-01-23 DIAGNOSIS — Z8543 Personal history of malignant neoplasm of ovary: Secondary | ICD-10-CM

## 2017-01-23 LAB — CA 125: CANCER ANTIGEN (CA) 125: 42.6 U/mL — AB (ref 0.0–38.1)

## 2017-01-24 ENCOUNTER — Telehealth: Payer: Self-pay

## 2017-01-24 NOTE — Telephone Encounter (Signed)
Notified Natalie Stone of rising CA 125. Per Dr. Fransisca Connors we will proceed with CT of CAP. Scheduling will arrange and call her with date/time/instructions. She reports she has also called her cancer center in [place] and was told she does have genetic testing and they were to send it to her. We will await what they send before ordering any further testing. Oncology Nurse Navigator Documentation  Navigator Location: [facility] (01/24/17 0900)   )Navigator Encounter Type: Telephone;Diagnostic Results (01/24/17 0900) Telephone: Outgoing Call;Diagnostic Results (01/24/17 0900)                                                  Time Spent with Patient: 15 (01/24/17 0900)

## 2017-02-05 ENCOUNTER — Ambulatory Visit
Admission: RE | Admit: 2017-02-05 | Discharge: 2017-02-05 | Disposition: A | Discharge status: Home / Self Care | Payer: Medicare PPO | Source: Ambulatory Visit | Attending: Nurse Practitioner | Admitting: Nurse Practitioner

## 2017-02-05 DIAGNOSIS — I2510 Atherosclerotic heart disease of native coronary artery without angina pectoris: Secondary | ICD-10-CM | POA: Diagnosis not present

## 2017-02-05 DIAGNOSIS — Z950 Presence of cardiac pacemaker: Secondary | ICD-10-CM | POA: Diagnosis not present

## 2017-02-05 DIAGNOSIS — R918 Other nonspecific abnormal finding of lung field: Secondary | ICD-10-CM | POA: Insufficient documentation

## 2017-02-05 DIAGNOSIS — I700 Atherosclerosis of aorta: Secondary | ICD-10-CM | POA: Insufficient documentation

## 2017-02-05 DIAGNOSIS — Z90710 Acquired absence of both cervix and uterus: Secondary | ICD-10-CM | POA: Insufficient documentation

## 2017-02-05 DIAGNOSIS — Z8543 Personal history of malignant neoplasm of ovary: Secondary | ICD-10-CM | POA: Diagnosis not present

## 2017-02-05 DIAGNOSIS — N281 Cyst of kidney, acquired: Secondary | ICD-10-CM | POA: Insufficient documentation

## 2017-02-05 LAB — POCT I-STAT CREATININE: CREATININE: 0.8 mg/dL (ref 0.44–1.00)

## 2017-02-05 MED ORDER — IOPAMIDOL (ISOVUE-300) INJECTION 61%
100.0000 mL | Freq: Once | INTRAVENOUS | Status: AC | PRN
  Administered 2017-02-05: 70 mL via INTRAVENOUS

## 2017-02-06 ENCOUNTER — Other Ambulatory Visit: Payer: Self-pay | Admitting: Nurse Practitioner

## 2017-02-06 DIAGNOSIS — C569 Malignant neoplasm of unspecified ovary: Secondary | ICD-10-CM

## 2017-02-07 ENCOUNTER — Telehealth: Payer: Self-pay

## 2017-02-07 DIAGNOSIS — E782 Mixed hyperlipidemia: Secondary | ICD-10-CM | POA: Insufficient documentation

## 2017-02-07 NOTE — Telephone Encounter (Signed)
Ms. Zazueta notified of lab appointment 1/31 at 1445. Read back performed. Oncology Nurse Navigator Documentation  Navigator Location: [facility] (02/07/17 0900)   )Navigator Encounter Type: Telephone (02/07/17 0900)                                                    Time Spent with Patient: 15 (02/07/17 0900)

## 2017-02-07 NOTE — Telephone Encounter (Signed)
Natalie Stone returned call for CT results. CT results given. Per Dr. Fransisca Connors we will repeat CA125 in 6 weeks and repeat CT of CAP in 3 months with physician follow up.She will be notified once all appointment are arranged.  IMPRESSION: 1. 14 x 13 mm low-density oval mass in the right retrocrural location at the level of the aortic hiatus. This may reflect a mildly enlarged retrocrural lymph node. This could potentially reflect metastatic adenopathy. It could reflect a small lymphangiocele or chronic reactive lymph node. If there are any previous CT exams available for comparison, these could help exclude metastatic disease. If not, follow-up chest CT in 2-3 months to reassess for stability would be recommended. 2. No other findings to suggest metastatic disease. No evidence of locally recurrent ovarian carcinoma. Status post hysterectomy. 3. No acute findings. 4. Coronary artery calcifications.  Aortic atherosclerosis. 5. Mild increased colonic stool burden.     Oncology Nurse Navigator Documentation  Navigator Location: [facility] (02/07/17 0800)   )Navigator Encounter Type: Telephone;Diagnostic Results (02/07/17 0800) Telephone: Incoming Call;Diagnostic Results (02/07/17 0800)                                                  Time Spent with Patient: 15 (02/07/17 0800)

## 2017-02-26 DIAGNOSIS — I442 Atrioventricular block, complete: Secondary | ICD-10-CM | POA: Diagnosis not present

## 2017-03-21 ENCOUNTER — Other Ambulatory Visit: Payer: Self-pay

## 2017-03-21 ENCOUNTER — Inpatient Hospital Stay: Payer: Medicare HMO | Attending: Internal Medicine

## 2017-03-21 DIAGNOSIS — C569 Malignant neoplasm of unspecified ovary: Secondary | ICD-10-CM

## 2017-03-21 DIAGNOSIS — Z8543 Personal history of malignant neoplasm of ovary: Secondary | ICD-10-CM | POA: Insufficient documentation

## 2017-03-22 LAB — CA 125: Cancer Antigen (CA) 125: 56.5 U/mL — ABNORMAL HIGH (ref 0.0–38.1)

## 2017-04-19 ENCOUNTER — Telehealth: Payer: Self-pay

## 2017-04-19 NOTE — Telephone Encounter (Signed)
Called and confirmed that she has received her Ct scan and instructions. Read back performed. Oncology Nurse Navigator Documentation  Navigator Location: [facility] (04/19/17 1400)   )Navigator Encounter Type: Telephone (04/19/17 1400) Telephone: Natalie Stone Call;Appt Confirmation/Clarification (04/19/17 1400)                                                  Time Spent with Patient: 15 (04/19/17 1400)

## 2017-04-19 NOTE — Telephone Encounter (Signed)
Received voicemail from Ms. Lince regarding upcoming care. Attempted to call back with no answer or voicemail at this time. Per our last conversation she is to have a repeat CT scan on 3/18. This has not been scheduled. I have notified central scheduling regarding this and they are going to contact Ms. Schunk. I will attempt to call her again to update on her plan. Oncology Nurse Navigator Documentation  Navigator Location: [facility] (04/19/17 1200)   )Navigator Encounter Type: Telephone (04/19/17 1200) Telephone: Lahoma Crocker Call;Appt Confirmation/Clarification;Patient Update (04/19/17 1200)                                                  Time Spent with Patient: 15 (04/19/17 1200)

## 2017-04-30 DIAGNOSIS — T451X5A Adverse effect of antineoplastic and immunosuppressive drugs, initial encounter: Secondary | ICD-10-CM | POA: Diagnosis not present

## 2017-04-30 DIAGNOSIS — I700 Atherosclerosis of aorta: Secondary | ICD-10-CM | POA: Diagnosis not present

## 2017-04-30 DIAGNOSIS — G620 Drug-induced polyneuropathy: Secondary | ICD-10-CM | POA: Diagnosis not present

## 2017-04-30 DIAGNOSIS — M818 Other osteoporosis without current pathological fracture: Secondary | ICD-10-CM | POA: Diagnosis not present

## 2017-05-01 ENCOUNTER — Ambulatory Visit
Admission: RE | Admit: 2017-05-01 | Discharge: 2017-05-01 | Disposition: A | Discharge status: Home / Self Care | Payer: Medicare HMO | Source: Ambulatory Visit | Attending: Nurse Practitioner | Admitting: Nurse Practitioner

## 2017-05-01 DIAGNOSIS — C569 Malignant neoplasm of unspecified ovary: Secondary | ICD-10-CM | POA: Diagnosis not present

## 2017-05-01 DIAGNOSIS — Z90710 Acquired absence of both cervix and uterus: Secondary | ICD-10-CM | POA: Insufficient documentation

## 2017-05-01 DIAGNOSIS — R971 Elevated cancer antigen 125 [CA 125]: Secondary | ICD-10-CM | POA: Diagnosis not present

## 2017-05-01 MED ORDER — IOPAMIDOL (ISOVUE-300) INJECTION 61%
75.0000 mL | Freq: Once | INTRAVENOUS | Status: AC | PRN
  Administered 2017-05-01: 75 mL via INTRAVENOUS

## 2017-05-02 ENCOUNTER — Telehealth: Payer: Self-pay

## 2017-05-02 ENCOUNTER — Ambulatory Visit: Payer: Medicare HMO

## 2017-05-02 NOTE — Telephone Encounter (Signed)
Called and reviewed Ct results with Ms. Parsley. She will keep May appointment with Dr. Fransisca Connors and CA 125 will be repeated that day. She will need Ct in 6 months to follow up on lung nodule.  IMPRESSION: Status post hysterectomy and suspected bilateral salpingo-oophorectomy.  No findings specific for recurrent or metastatic disease.  Benign cystic lesion in the right retrocrural space is likely related to the thoracic duct. This is not considered suspicious for a lymph node.  4 mm subpleural node in the posterior right middle lobe, new, nonspecific. This appearance is favored to be infectious/inflammatory. However, consider follow-up CT chest in 6 months.   Oncology Nurse Navigator Documentation  Navigator Location: [facility] (05/02/17 1000)   )Navigator Encounter Type: Telephone (05/02/17 1000) Telephone: Outgoing Call;Diagnostic Results (05/02/17 1000)                                                  Time Spent with Patient: 15 (05/02/17 1000)

## 2017-05-06 DIAGNOSIS — M5010 Cervical disc disorder with radiculopathy, unspecified cervical region: Secondary | ICD-10-CM | POA: Diagnosis not present

## 2017-05-06 DIAGNOSIS — C562 Malignant neoplasm of left ovary: Secondary | ICD-10-CM | POA: Diagnosis not present

## 2017-05-06 DIAGNOSIS — I700 Atherosclerosis of aorta: Secondary | ICD-10-CM | POA: Diagnosis not present

## 2017-05-06 DIAGNOSIS — G620 Drug-induced polyneuropathy: Secondary | ICD-10-CM | POA: Diagnosis not present

## 2017-05-06 DIAGNOSIS — E782 Mixed hyperlipidemia: Secondary | ICD-10-CM | POA: Diagnosis not present

## 2017-05-06 DIAGNOSIS — Z0000 Encounter for general adult medical examination without abnormal findings: Secondary | ICD-10-CM | POA: Diagnosis not present

## 2017-05-06 DIAGNOSIS — M818 Other osteoporosis without current pathological fracture: Secondary | ICD-10-CM | POA: Diagnosis not present

## 2017-05-06 DIAGNOSIS — C561 Malignant neoplasm of right ovary: Secondary | ICD-10-CM | POA: Diagnosis not present

## 2017-05-06 DIAGNOSIS — E039 Hypothyroidism, unspecified: Secondary | ICD-10-CM | POA: Diagnosis not present

## 2017-06-19 ENCOUNTER — Inpatient Hospital Stay: Payer: Medicare HMO | Attending: Obstetrics and Gynecology | Admitting: Obstetrics and Gynecology

## 2017-06-19 ENCOUNTER — Inpatient Hospital Stay: Payer: Medicare HMO

## 2017-06-19 VITALS — BP 117/79 | HR 92 | Temp 98.4°F | Resp 18 | Ht 62.0 in | Wt 121.4 lb

## 2017-06-19 DIAGNOSIS — Z90722 Acquired absence of ovaries, bilateral: Secondary | ICD-10-CM

## 2017-06-19 DIAGNOSIS — C579 Malignant neoplasm of female genital organ, unspecified: Secondary | ICD-10-CM | POA: Diagnosis not present

## 2017-06-19 DIAGNOSIS — Z90710 Acquired absence of both cervix and uterus: Secondary | ICD-10-CM | POA: Diagnosis not present

## 2017-06-19 DIAGNOSIS — Z8543 Personal history of malignant neoplasm of ovary: Secondary | ICD-10-CM | POA: Diagnosis not present

## 2017-06-19 DIAGNOSIS — C569 Malignant neoplasm of unspecified ovary: Secondary | ICD-10-CM

## 2017-06-19 DIAGNOSIS — Z9221 Personal history of antineoplastic chemotherapy: Secondary | ICD-10-CM | POA: Diagnosis not present

## 2017-06-19 NOTE — Progress Notes (Signed)
Notified Ofri of need for genetic testing. She will have blood drawn today. Oncology Nurse Navigator Documentation  Navigator Location: [facility] (06/19/17 1500)   )Navigator Encounter Type: Follow-up Appt (06/19/17 1500)                     Patient Visit Type: GynOnc (06/19/17 1500)                              Time Spent with Patient: 15 (06/19/17 1500)

## 2017-06-19 NOTE — Progress Notes (Signed)
No new Gyn changes noted today 

## 2017-06-19 NOTE — Progress Notes (Signed)
Gynecologic Oncology Consult Visit   Referring Provider: Dr Rogue Bussing  Chief Concern: ovarian cancer surveillance  Subjective:  Natalie Stone is a 84 y.o. female seen in consultation from Dr. Rogue Bussing for follow up of ovarian cancer.   No complaints today.  Here for follow up exam and CA125 in view of rising levels.  She had an episode of Shingles end of last year and wonders if this could cause CA125 to rise.    05/01/2017- CT C/A/P IMPRESSION: -Status post hysterectomy and suspected bilateral salpingo-oophorectomy. - No findings specific for recurrent or metastatic disease.  - Benign cystic lesion in the right retrocrural space is likely related to the thoracic duct. This is not considered suspicious for a lymph node. - 4 mm subpleural node in the posterior right middle lobe, new, nonspecific. This appearance is favored to be infectious/inflammatory. However, consider follow-up CT chest in 6 months.  02/05/2017- CT C/A/P IMPRESSION: 1. 14 x 13 mm low-density oval mass in the right retrocrural location at the level of the aortic hiatus. This may reflect a mildly enlarged retrocrural lymph node. This could potentially reflect metastatic adenopathy. It could reflect a small lymphangiocele or chronic reactive lymph node. If there are any previous CT exams available for comparison, these could help exclude metastatic disease. If not, follow-up chest CT in 2-3 months to reassess for stability would be recommended. 2. No other findings to suggest metastatic disease. No evidence of locally recurrent ovarian carcinoma. Status post hysterectomy. 3. No acute findings. 4. Coronary artery calcifications.  Aortic atherosclerosis. 5. Mild increased colonic stool burden.  CA 125 03/21/2017- 56.5 01/22/2017- 42.6 12/19/2016- 33.3  Oncology History Ovarian cancer- stage III per patient [no records available]. Status post neoadjuvant chemotherapy followed by surgery with Dr Eppie Gibson in [place] in  2013; followed by adjuvant chemotherapy. Clinically no concerns for recurrence at this time.  She said she had negative genetic testing.  Establish care at [facility] in 2017 since she moved back to this area to be near family.  She is grandmother of Dr. Benjaman Kindler.  CA125 3/18 =13.7  Mild SUI.  No other symptoms.  She had bladder repair many years ago for SUI after having children.  Has been treated with anticholenergics in past, but mouth was very dry and had problems talking so stopped the medication.  Now uses Mirabegron 50 mg qd (a B-agonist) for urge incontinence per Dr Buel Ream suggestion.  No symptoms since she started this medication.   Problem List: Patient Active Problem List   Diagnosis Date Noted  . Malignant neoplasm of ovary ([place]) 11/23/2015  . Acquired hypothyroidism 10/26/2015  . Aortic atherosclerosis ([place]) 10/26/2015  . Cardiac pacemaker 10/26/2015  . Chemotherapy-induced neuropathy ([place]) 10/26/2015  . Coronary artery disease involving native coronary artery of native heart without angina pectoris 10/26/2015  . Pacemaker pocket hematoma 10/26/2015  . Status post placement of cardiac pacemaker 10/26/2015  . Bradycardia 10/09/2015  . Third degree AV block ([place]) 10/09/2015    Past Medical History: Past Medical History:  Diagnosis Date  . CAD (coronary artery disease)   . Chronic kidney disease    urinary incontinence  . Hyperthyroidism   . Hypothyroidism (acquired)   . Ovarian cancer ([place]) 2014  . Shingles   . Third degree AV block ([place]) 10/09/2015    Past Surgical History: Past Surgical History:  Procedure Laterality Date  . ABDOMINAL HYSTERECTOMY    . BLADDER REPAIR    . PACEMAKER INSERTION N/A 10/11/2015   Procedure: INSERTION PACEMAKER;  Surgeon: [name], MD;  Location: [facility] ORS;  Service: Cardiovascular;  Laterality: N/A;  . PARTIAL HYSTERECTOMY       Family History: Family History  Problem Relation Age of Onset  . Diabetes Brother      Social History: Social History   Socioeconomic History  . Marital status: Married    Spouse name: Not on file  . Number of children: Not on file  . Years of education: Not on file  . Highest education level: Not on file  Occupational History  . Not on file  Social Needs  . Financial resource strain: Not on file  . Food insecurity:    Worry: Not on file    Inability: Not on file  . Transportation needs:    Medical: Not on file    Non-medical: Not on file  Tobacco Use  . Smoking status: Never Smoker  . Smokeless tobacco: Never Used  Substance and Sexual Activity  . Alcohol use: No  . Drug use: No  . Sexual activity: Never  Lifestyle  . Physical activity:    Days per week: Not on file    Minutes per session: Not on file  . Stress: Not on file  Relationships  . Social connections:    Talks on phone: Not on file    Gets together: Not on file    Attends religious service: Not on file    Active member of club or organization: Not on file    Attends meetings of clubs or organizations: Not on file    Relationship status: Not on file  . Intimate partner violence:    Fear of current or ex partner: Not on file    Emotionally abused: Not on file    Physically abused: Not on file    Forced sexual activity: Not on file  Other Topics Concern  . Not on file  Social History Narrative  . Not on file    Allergies: Allergies  Allergen Reactions  . Penicillins Anaphylaxis    Has patient had a PCN reaction causing immediate rash, facial/tongue/throat swelling, SOB or lightheadedness with hypotension: Yes Has patient had a PCN reaction causing severe rash involving mucus membranes or skin necrosis: No Has patient had a PCN reaction that required hospitalization No Has patient had a PCN reaction occurring within the last 10 years: No If all of the above answers are "NO", then may proceed with Cephalosporin use.  . Ativan [Lorazepam] Other (See Comments)    Made her not  remember for many hours.  . Benadryl [Diphenhydramine] Other (See Comments)    Made her sleep for hours.    Current Medications: Current Outpatient Medications  Medication Sig Dispense Refill  . aspirin EC 81 MG tablet Take 81 mg by mouth daily.    Marland Kitchen atorvastatin (LIPITOR) 20 MG tablet Take 10 mg by mouth at bedtime.    Marland Kitchen b complex vitamins tablet Take 1 tablet by mouth daily.    Marland Kitchen Bioflavonoid Products (ESTER C PO) Take 500 mg by mouth daily.     . Cholecalciferol (VITAMIN D3) 1000 units CAPS Take by mouth daily.     Marland Kitchen glucosamine-chondroitin 500-400 MG tablet Take 1 tablet by mouth 2 (two) times daily.     Marland Kitchen levothyroxine (SYNTHROID, LEVOTHROID) 88 MCG tablet Take 88 mcg by mouth at bedtime.     . mirabegron ER (MYRBETRIQ) 50 MG TB24 tablet Take 1 tablet (50 mg total) by mouth daily. 30 tablet 5  . gabapentin (NEURONTIN)  100 MG capsule Take 100 mg by mouth 2 (two) times daily. 2 pills in am and 4 pills at night     No current facility-administered medications for this visit.     Review of Systems General: fatigue Skin: no complaints Eyes: no complaints HEENT: no complaints Breasts: no complaints Pulmonary: no complaints Cardiac: no complaints Gastrointestinal: no complaints Genitourinary/Sexual: problems with bladder function (chronic) Ob/Gyn: no complaints Musculoskeletal: no complaints Hematology: no complaints Neurologic/Psych: no complaints   Objective:  Physical Examination:  BP 117/79 (BP Location: Left Arm, Patient Position: Sitting)   Pulse 92   Temp 98.4 F (36.9 C) (Tympanic)   Resp 18   Ht 5\' 2"  (1.575 m)   Wt 121 lb 6.4 oz (55.1 kg)   SpO2 98%   BMI 22.20 kg/m    ECOG Performance Status: 0 - Asymptomatic  General appearance: alert, cooperative and appears stated age HEENT:PERRLA and neck supple with midline trachea Lymph node survey: non-palpable, axillary, inguinal, supraclavicular Cardiovascular: regular rate and rhythm, no murmurs or  gallops Respiratory: normal air entry, lungs clear to auscultation and no rales, rhonchi or wheezing Breast exam: not examined. Abdomen: soft, non-tender, without masses or organomegaly, no hernias and well healed incision Back: inspection of back is normal Extremities: extremities normal, atraumatic, no cyanosis or edema Skin exam - normal coloration and turgor, no rashes, no suspicious skin lesions noted. Neurological exam reveals alert, oriented, normal speech, no focal findings or movement disorder noted.  Pelvic: exam chaperoned by nurse;  Vulva: normal appearing vulva with no masses, tenderness or lesions; Vagina: normal vagina; Adnexa: surgically absent bilateral; Rectal: normal    Assessment:  Natalie Stone is a 84 y.o. female diagnosed with advanced ovarian cancer treated with neoadjuvant chemo and interval debulking in 2013 in [place].  NED but CA125 has been rising despite normal scans and lack of symptoms.    Urge incontinence s/p surgery for SUI in the past.  Now uses Mirabegron 50 mg qd (a B-agonist) for urge incontinence per Dr Buel Ream suggestion with good results and no symptoms.   Plan:   Problem List Items Addressed This Visit    None     Will repeat CA125 today and she will RTC in 6 months if not rising.  Can return sooner if new concerning symptoms.  Repeat scan scheduled for September due to 4 mm nonspecific lung nodule.   The patient's diagnosis, an outline of the further diagnostic and laboratory studies which will be required, the recommendation, and alternatives were discussed.  All questions were answered to the patient's satisfaction.  It is not clear that genetic testing has been done, so will order [facility] panel test.    Beckey Rutter, DNP, AGNP-C [facility] (work cell) [phone] (office)  CC:  Rusty Aus, MD [facility] [place], [place] [address] [phone]   I  personally interviewed and examined the patient. Agreed with the above/below plan of care. Patient/family questions were answered.  Mellody Drown, MD

## 2017-06-20 ENCOUNTER — Telehealth: Payer: Self-pay

## 2017-06-20 ENCOUNTER — Telehealth: Payer: Self-pay | Admitting: Genetic Counselor

## 2017-06-20 DIAGNOSIS — C569 Malignant neoplasm of unspecified ovary: Secondary | ICD-10-CM

## 2017-06-20 LAB — CA 125: CANCER ANTIGEN (CA) 125: 174.9 U/mL — AB (ref 0.0–38.1)

## 2017-06-20 NOTE — Telephone Encounter (Signed)
06/20/17 CA 125 - 174.9 Spoke with Dr. Fransisca Connors and he would like CT CAP now. Spoke with Ms. Natalie Stone. Notified of results and need for CT scan. She is agreeable. Orders placed. She will be notified with CT appointment.  Results for [name], [name] (MRN [id number]) as of 06/20/2017 09:16  Ref. Range 04/25/2016 14:25 12/19/2016 16:32 01/22/2017 13:50 03/21/2017 14:49 06/19/2017 14:05  CA 125 Latest Ref Range: 0.0 - 38.1 U/mL 13.8      Cancer Antigen (CA) 125 Latest Ref Range: 0.0 - 38.1 U/mL  33.3 42.6 (H) 56.5 (H) 174.9 (H)   Oncology Nurse Navigator Documentation  Navigator Location: [facility] (06/20/17 0900)   )Navigator Encounter Type: Diagnostic Results (06/20/17 0900)                     Patient Visit Type: GynOnc (06/20/17 0900)   Barriers/Navigation Needs: Coordination of Care (06/20/17 0900)                          Time Spent with Patient: 15 (06/20/17 0900)

## 2017-06-20 NOTE — Telephone Encounter (Signed)
Dr. Rogue Bussing is referring Natalie Stone for genetic counseling due to a personal history of ovarian cancer. I spoke with her and she chose to schedule this telegenetics visit on Monday, 06/24/17 at 10:00am.  Steele Berg, [place], [place] Genetic Counselor Phone: [phone]

## 2017-06-24 ENCOUNTER — Encounter: Payer: Self-pay | Admitting: Genetic Counselor

## 2017-06-24 ENCOUNTER — Telehealth: Payer: Self-pay | Admitting: Genetic Counselor

## 2017-06-24 NOTE — Telephone Encounter (Signed)
Cancer Genetics            Telegenetics Initial Visit    Patient Name: Natalie Stone Patient DOB: Jul 26, 1926 Patient Age: 84 y.o. Phone Call Date: 06/24/2017  Referring Provider: Mellody Drown, MD  Reason for Visit: Evaluate for hereditary susceptibility to cancer    Assessment and Plan:  . Natalie Stone's history is not suggestive of a hereditary predisposition to cancer. However, she meets NCCN criteria for genetic testing due to personal history of ovarian cancer.  . Testing is recommended to determine whether she has a pathogenic mutation that will impact her screening and risk-reduction for cancer. A negative result will be reassuring.  . Natalie Stone has already had her blood drawn in clinic and sent to [facility]. Analysis will include the multiple genes on a gene panel.   . Results should be available in approximately 2-3 weeks, at which point we will contact her and address implications for her as well as address genetic testing for at-risk family members, if needed.     Dr. Grayland Ormond was available for questions concerning this case. Total time spent by counseling by phone was approximately 20 minutes.   _____________________________________________________________________   History of Present Illness: Ms. Natalie Stone, a 84 y.o. female, was referred for genetic counseling to discuss the possibility of a hereditary predisposition to cancer and discuss whether genetic testing is warranted. This was a telegenetics visit via phone. Her daughter, Natalie Stone, joined her for this conversation.  Natalie Stone has a history of ovarian cancer diagnosed at the age of 68.  She indicated that she had BRCA1/BRCA2 genetic testing around the time of diagnosis which was negative for mutations.   Oncology History   # NOV 2013-RIGHT OVARIAN CANCER-HIGH GRADE SEROUS CA 125-996 STAGE III [[facility]Stephanie [facility]Jan 2014- neo-adj chemo x3 M;April 2014 TAH & BSO ; chemo x 44M  [finished C-6; July 2014]. Ca 125 -62; N  # GENETIC TESTING-"OncoGene Dx"-NEG  S/p pacemaker [Dr.Parascholes]     Malignant neoplasm of ovary ([place])   11/23/2015 Initial Diagnosis    Ovarian cancer, unspecified laterality [facility])       Past Medical History:  Diagnosis Date  . CAD (coronary artery disease)   . Chronic kidney disease    urinary incontinence  . Hyperthyroidism   . Hypothyroidism (acquired)   . Ovarian cancer ([place]) 2014  . Shingles   . Third degree AV block ([place]) 10/09/2015    Past Surgical History:  Procedure Laterality Date  . ABDOMINAL HYSTERECTOMY    . BLADDER REPAIR    . PACEMAKER INSERTION N/A 10/11/2015   Procedure: INSERTION PACEMAKER;  Surgeon: [name], MD;  Location: [facility] ORS;  Service: Cardiovascular;  Laterality: N/A;  . PARTIAL HYSTERECTOMY      Family History: Natalie Stone has no significant family history of cancer. She has one daughter (age 63). Her son committed suicide at 52. She has 2 sisters (ages 53s and 65s). She had 2 brothers. One died at 22 of lung cancer and one died at 52. Her mother died at 18. She had a brother and a sister. Her father died at 67. He had 4 brothers. There are no cancers reported in any of her cousins or grandparents.  Natalie Stone ancestry is Caucasian - NOS. There is no known Jewish ancestry and no consanguinity.  Discussion: We reviewed the characteristics, features and inheritance patterns of hereditary cancer syndromes. We discussed her risk of  harboring a mutation in the context of her personal and family history. We discussed the process of genetic testing, insurance coverage and implications of results: positive, negative and variant of unknown significance (VUS).   Natalie Stone questions were answered to her satisfaction today and she is welcome to call with any additional questions or concerns. Thank you for the referral and allowing [place] to share in the care of your patient.    Steele Berg, MS,  [place] Certified Genetic Counselor phone: [phone]

## 2017-06-27 ENCOUNTER — Ambulatory Visit
Admission: RE | Admit: 2017-06-27 | Discharge: 2017-06-27 | Disposition: A | Discharge status: Home / Self Care | Payer: Medicare HMO | Source: Ambulatory Visit | Attending: Nurse Practitioner | Admitting: Nurse Practitioner

## 2017-06-27 DIAGNOSIS — R188 Other ascites: Secondary | ICD-10-CM | POA: Diagnosis not present

## 2017-06-27 DIAGNOSIS — C569 Malignant neoplasm of unspecified ovary: Secondary | ICD-10-CM | POA: Diagnosis not present

## 2017-06-27 DIAGNOSIS — R1909 Other intra-abdominal and pelvic swelling, mass and lump: Secondary | ICD-10-CM | POA: Diagnosis not present

## 2017-06-27 DIAGNOSIS — R911 Solitary pulmonary nodule: Secondary | ICD-10-CM | POA: Diagnosis not present

## 2017-06-27 DIAGNOSIS — R918 Other nonspecific abnormal finding of lung field: Secondary | ICD-10-CM | POA: Diagnosis not present

## 2017-06-27 LAB — POCT I-STAT CREATININE: CREATININE: 0.7 mg/dL (ref 0.44–1.00)

## 2017-06-27 MED ORDER — IOPAMIDOL (ISOVUE-300) INJECTION 61%
80.0000 mL | Freq: Once | INTRAVENOUS | Status: AC | PRN
  Administered 2017-06-27: 80 mL via INTRAVENOUS

## 2017-06-28 ENCOUNTER — Telehealth: Payer: Self-pay | Admitting: Nurse Practitioner

## 2017-06-28 ENCOUNTER — Other Ambulatory Visit: Payer: Self-pay | Admitting: Nurse Practitioner

## 2017-06-28 DIAGNOSIS — C569 Malignant neoplasm of unspecified ovary: Secondary | ICD-10-CM

## 2017-06-28 NOTE — Telephone Encounter (Signed)
Called patient to discuss results from recent CT scan. Had shared results with Dr. Fransisca Connors. I spoke to Dr. Kathlene Cote in radiology who did not feel there were any areas amenable to biopsy at this time. Discussed this with Dr. Fransisca Connors who recommended having patient return to clinic in 6 weeks to repeat Ca125 and re-evaluation. Discussed these results with patient. Several questions answered. Encouraged patient to call clinic if questions or concerns.

## 2017-07-05 ENCOUNTER — Telehealth: Payer: Self-pay | Admitting: Genetic Counselor

## 2017-07-05 NOTE — Telephone Encounter (Signed)
Cancer Genetics             Telegenetics Results Disclosure   Patient Name: Zhoe Catania Patient DOB: Nov 09, 1926 Patient Age: 84 y.o. Phone Call Date: 07/05/2017  Referring Provider: Mellody Drown, MD    Ms. Duffell was called today to discuss genetic test results. Please see the Genetics telephone note from 06/24/2017 for a detailed discussion of her personal and family histories and the recommendations provided.  Genetic Testing: At the time of Ms. Rys's telegenetics visit, she decided to pursue genetic testing of multiple genes associated with hereditary susceptibility to cancer. Testing included sequencing and deletion/duplication analysis. Testing did not reveal a pathogenic mutation in any of the genes analyzed.  A copy of the genetic test report will be scanned into Epic under the Media tab.  The genes analyzed were the 83 genes on [facility]'s Multi-Cancer panel (ALK, APC, ATM, AXIN2, BAP1, BARD1, BLM, BMPR1A, BRCA1, BRCA2, BRIP1, CASR, CDC73, CDH1, CDK4, CDKN1B, CDKN1C, CDKN2A, CEBPA, CHEK2, CTNNA1, DICER1, DIS3L2, EGFR, EPCAM, FH, FLCN, GATA2, GPC3, GREM1, HOXB13, HRAS, KIT, MAX, MEN1, MET, MITF, MLH1, MSH2, MSH3, MSH6, MUTYH, NBN, NF1, NF2, NTHL1, PALB2, PDGFRA, PHOX2B, PMS2, POLD1, POLE, POT1, PRKAR1A, PTCH1, PTEN, RAD50, RAD51C, RAD51D, RB1, RECQL4, RET, RUNX1, SDHA, SDHAF2, SDHB, SDHC, SDHD, SMAD4, SMARCA4, SMARCB1, SMARCE1, STK11, SUFU, TERC, TERT, TMEM127, TP53, TSC1, TSC2, VHL, WRN, WT1).  Since the current test is not perfect, it is possible that there may be a gene mutation that current testing cannot detect, but that chance is small. It is possible that a different genetic factor, which has not yet been discovered or is not on this panel, is responsible for the cancer diagnoses in the family. Again, the likelihood of this is low. No additional testing is recommended at this time for Ms. Shelton.  A Variant of Uncertain Significance was detected: MET  c.1412G>A (p.Gly471Glu). This is still considered a normal result. While at this time, it is unknown if this finding is associated with increased cancer risk, the majority of these variants get reclassified to be inconsequential. Medical management should not be based on this finding. With time, we suspect the lab will determine the significance, if any. If we do learn more about it, we will try to contact Ms. Sanda to discuss it further. It is important to stay in touch with [place] periodically and keep the address and phone number up to date.  Cancer Screening: These results suggest that Ms. Recinos's cancer was most likely not due to an inherited predisposition. Most cancers happen by chance and this test, along with details of her family history, suggests that her cancer falls into this category. She is recommended to follow the recommendations provided by her physicians.   Family Members: Family members are at some increased risk of developing cancer, over the general population risk, simply due to the family history. They are recommended to speak with their own providers about appropriate cancer screenings.   Any relative who had cancer at a young age or had a particularly rare cancer may also wish to pursue genetic testing. Genetic counselors can be located in other cities, by visiting the website of the [employer] (ArtistMovie.se) and Field seismologist for a Dietitian by zip code.   Family members are not recommended to get tested for the above VUS outside of a research protocol as this finding has no  implications for their medical management.    Lastly, cancer genetics is a rapidly advancing field and it is possible that new genetic tests will be appropriate for Ms. Zagami in the future. Ms. Templeman is encouraged to remain in contact with Genetics on an annual basis so we can update her personal and family histories, and let her know of advances in cancer genetics that may  benefit the family. Ms. Sease questions were answered to her satisfaction today, and she knows she is welcome to call anytime with additional questions.    Steele Berg, MS, [place] Certified Genetic Counselor phone: [phone]

## 2017-08-05 ENCOUNTER — Inpatient Hospital Stay: Payer: Medicare HMO | Attending: Obstetrics and Gynecology

## 2017-08-05 ENCOUNTER — Other Ambulatory Visit: Payer: Self-pay

## 2017-08-05 DIAGNOSIS — Z8543 Personal history of malignant neoplasm of ovary: Secondary | ICD-10-CM | POA: Diagnosis not present

## 2017-08-05 DIAGNOSIS — Z90722 Acquired absence of ovaries, bilateral: Secondary | ICD-10-CM | POA: Diagnosis not present

## 2017-08-05 DIAGNOSIS — Z9221 Personal history of antineoplastic chemotherapy: Secondary | ICD-10-CM | POA: Diagnosis not present

## 2017-08-05 DIAGNOSIS — Z90710 Acquired absence of both cervix and uterus: Secondary | ICD-10-CM | POA: Diagnosis not present

## 2017-08-05 DIAGNOSIS — C569 Malignant neoplasm of unspecified ovary: Secondary | ICD-10-CM

## 2017-08-06 LAB — CA 125: Cancer Antigen (CA) 125: 210.2 U/mL — ABNORMAL HIGH (ref 0.0–38.1)

## 2017-08-07 ENCOUNTER — Encounter: Payer: Self-pay | Admitting: Obstetrics and Gynecology

## 2017-08-07 ENCOUNTER — Inpatient Hospital Stay (HOSPITAL_BASED_OUTPATIENT_CLINIC_OR_DEPARTMENT_OTHER): Payer: Medicare HMO | Admitting: Obstetrics and Gynecology

## 2017-08-07 VITALS — BP 131/70 | HR 84 | Temp 98.1°F | Resp 18 | Ht 62.0 in | Wt 122.6 lb

## 2017-08-07 DIAGNOSIS — Z90710 Acquired absence of both cervix and uterus: Secondary | ICD-10-CM | POA: Diagnosis not present

## 2017-08-07 DIAGNOSIS — C569 Malignant neoplasm of unspecified ovary: Secondary | ICD-10-CM

## 2017-08-07 DIAGNOSIS — Z8543 Personal history of malignant neoplasm of ovary: Secondary | ICD-10-CM | POA: Diagnosis not present

## 2017-08-07 DIAGNOSIS — Z9221 Personal history of antineoplastic chemotherapy: Secondary | ICD-10-CM

## 2017-08-07 DIAGNOSIS — Z90722 Acquired absence of ovaries, bilateral: Secondary | ICD-10-CM | POA: Diagnosis not present

## 2017-08-07 NOTE — Progress Notes (Signed)
Gynecologic Oncology Consult Visit   Referring Provider: Dr. Rogue Bussing  Chief Concern: ovarian cancer surveillance  Subjective:  Natalie Stone is a 84 y.o. female seen in consultation from Dr. Rogue Bussing for follow up of ovarian cancer.   No complaints today.  No GI, GU or neurological complaints. Here for follow up exam and CA125 in view of rising levels.  Since 10/18 CA125 has risen from 33 to 42 to 56 to 174 to 210 today.     In 5/19 the In vitae 83 gene panel was negative for pathogenic mutations. Had one VUS in the MET gene.   06/27/17 CT C/A/P IMPRESSION: 1. There is a small amount of complex fluid within the pelvis with peripheral peritoneal enhancement. Findings are indeterminate but concerning for the possibility of recurrent disease. 2. Additionally within the pelvis there is a small hyperdense nodule which likely represents a hyperdense diverticulum as opposed to a peritoneal nodule as no definite additional peritoneal nodules are identified.  Recommend attention on follow-up. 3. Slight interval decrease in size of right middle lobe nodule, likely resolving infectious/inflammatory process. Recommend attention on follow-up.  05/01/2017- CT C/A/P IMPRESSION: -Status post hysterectomy and suspected bilateral salpingo-oophorectomy. - No findings specific for recurrent or metastatic disease.  - Benign cystic lesion in the right retrocrural space is likely related to the thoracic duct. This is not considered suspicious for a lymph node. - 4 mm subpleural node in the posterior right middle lobe, new, nonspecific. This appearance is favored to be infectious/inflammatory. However, consider follow-up CT chest in 6 months.  02/05/2017- CT C/A/P IMPRESSION: 1. 14 x 13 mm low-density oval mass in the right retrocrural location at the level of the aortic hiatus. This may reflect a mildly enlarged retrocrural lymph node. This could potentially reflect metastatic adenopathy. It could  reflect a small lymphangiocele or chronic reactive lymph node. If there are any previous CT exams available for comparison, these could help exclude metastatic disease. If not, follow-up chest CT in 2-3 months to reassess for stability would be recommended. 2. No other findings to suggest metastatic disease. No evidence of locally recurrent ovarian carcinoma. Status post hysterectomy. 3. No acute findings. 4. Coronary artery calcifications.  Aortic atherosclerosis. 5. Mild increased colonic stool burden.  Oncology History Ovarian cancer- stage III per patient [no records available]. Status post neoadjuvant chemotherapy followed by surgery with Dr Eppie Gibson in [place] in 2013; followed by adjuvant chemotherapy. Clinically no concerns for recurrence at this time.  She said she had negative genetic testing.  Establish care at [facility] in 2017 since she moved back to this area to be near family.  She is grandmother of Dr. Benjaman Kindler.  CA125 3/18 =13.7  Mild SUI.  No other symptoms.  She had bladder repair many years ago for SUI after having children.  Has been treated with anticholenergics in past, but mouth was very dry and had problems talking so stopped the medication.  Now uses Mirabegron 50 mg qd (a B-agonist) for urge incontinence per Dr Buel Ream suggestion.  No symptoms since she started this medication.   Problem List: Patient Active Problem List   Diagnosis Date Noted  . Malignant neoplasm of ovary ([place]) 11/23/2015  . Acquired hypothyroidism 10/26/2015  . Aortic atherosclerosis ([place]) 10/26/2015  . Cardiac pacemaker 10/26/2015  . Chemotherapy-induced neuropathy ([place]) 10/26/2015  . Coronary artery disease involving native coronary artery of native heart without angina pectoris 10/26/2015  . Pacemaker pocket hematoma 10/26/2015  . Status post placement of cardiac pacemaker 10/26/2015  .  Bradycardia 10/09/2015  . Third degree AV block ([place]) 10/09/2015    Past Medical  History: Past Medical History:  Diagnosis Date  . CAD (coronary artery disease)   . Chronic kidney disease    urinary incontinence  . Hyperthyroidism   . Hypothyroidism (acquired)   . Ovarian cancer ([place]) 2014  . Shingles   . Third degree AV block ([place]) 10/09/2015    Past Surgical History: Past Surgical History:  Procedure Laterality Date  . ABDOMINAL HYSTERECTOMY    . BLADDER REPAIR    . PACEMAKER INSERTION N/A 10/11/2015   Procedure: INSERTION PACEMAKER;  Surgeon: [name], MD;  Location: [facility] ORS;  Service: Cardiovascular;  Laterality: N/A;  . PARTIAL HYSTERECTOMY       Family History: Family History  Problem Relation Age of Onset  . Diabetes Brother     Social History: Social History   Socioeconomic History  . Marital status: Married    Spouse name: Not on file  . Number of children: Not on file  . Years of education: Not on file  . Highest education level: Not on file  Occupational History  . Not on file  Social Needs  . Financial resource strain: Not on file  . Food insecurity:    Worry: Not on file    Inability: Not on file  . Transportation needs:    Medical: Not on file    Non-medical: Not on file  Tobacco Use  . Smoking status: Never Smoker  . Smokeless tobacco: Never Used  Substance and Sexual Activity  . Alcohol use: No  . Drug use: No  . Sexual activity: Never  Lifestyle  . Physical activity:    Days per week: Not on file    Minutes per session: Not on file  . Stress: Not on file  Relationships  . Social connections:    Talks on phone: Not on file    Gets together: Not on file    Attends religious service: Not on file    Active member of club or organization: Not on file    Attends meetings of clubs or organizations: Not on file    Relationship status: Not on file  . Intimate partner violence:    Fear of current or ex partner: Not on file    Emotionally abused: Not on file    Physically abused: Not on file    Forced sexual  activity: Not on file  Other Topics Concern  . Not on file  Social History Narrative  . Not on file    Allergies: Allergies  Allergen Reactions  . Penicillins Anaphylaxis    Has patient had a PCN reaction causing immediate rash, facial/tongue/throat swelling, SOB or lightheadedness with hypotension: Yes Has patient had a PCN reaction causing severe rash involving mucus membranes or skin necrosis: No Has patient had a PCN reaction that required hospitalization No Has patient had a PCN reaction occurring within the last 10 years: No If all of the above answers are "NO", then may proceed with Cephalosporin use.  . Ativan [Lorazepam] Other (See Comments)    Made her not remember for many hours.  . Benadryl [Diphenhydramine] Other (See Comments)    Made her sleep for hours.    Current Medications: Current Outpatient Medications  Medication Sig Dispense Refill  . aspirin EC 81 MG tablet Take 81 mg by mouth daily.    Marland Kitchen atorvastatin (LIPITOR) 20 MG tablet Take 10 mg by mouth at bedtime.    Marland Kitchen b  complex vitamins tablet Take 1 tablet by mouth daily.    Marland Kitchen Bioflavonoid Products (ESTER C PO) Take 500 mg by mouth daily.     . Cholecalciferol (VITAMIN D3) 1000 units CAPS Take by mouth daily.     Marland Kitchen glucosamine-chondroitin 500-400 MG tablet Take 1 tablet by mouth 2 (two) times daily.     Marland Kitchen levothyroxine (SYNTHROID, LEVOTHROID) 88 MCG tablet Take 88 mcg by mouth at bedtime.     . gabapentin (NEURONTIN) 100 MG capsule Take 100 mg by mouth 2 (two) times daily. 2 pills in am and 4 pills at night    . mirabegron ER (MYRBETRIQ) 50 MG TB24 tablet Take 1 tablet (50 mg total) by mouth daily. 30 tablet 5   No current facility-administered medications for this visit.     Review of Systems General: fatigue Skin: no complaints Eyes: no complaints HEENT: no complaints Breasts: no complaints Pulmonary: no complaints Cardiac: no complaints Gastrointestinal: no complaints Genitourinary/Sexual: problems  with bladder function (chronic) Ob/Gyn: no complaints Musculoskeletal: no complaints Hematology: no complaints Neurologic/Psych: no complaints   Objective:  Physical Examination:  BP 131/70   Pulse 84   Temp 98.1 F (36.7 C) (Oral)   Resp 18   Ht _0  (1.575 m)   Wt 122 lb 9.6 oz (55.6 kg)   BMI 22.42 kg/m    ECOG Performance Status: 0 - Asymptomatic  General appearance: alert, cooperative and appears stated age HEENT:PERRLA and neck supple with midline trachea Lymph node survey: non-palpable, axillary, inguinal, supraclavicular Cardiovascular: regular rate and rhythm, no murmurs or gallops Respiratory: normal air entry, lungs clear to auscultation and no rales, rhonchi or wheezing Breast exam: not examined. Abdomen: soft, non-tender, without masses or organomegaly, no hernias and well healed incision Back: inspection of back is normal Extremities: extremities normal, atraumatic, no cyanosis or edema Skin exam - normal coloration and turgor, no rashes, no suspicious skin lesions noted. Neurological exam reveals alert, oriented, normal speech, no focal findings or movement disorder noted.  Pelvic: exam chaperoned by nurse;  Vulva: normal appearing vulva with no masses, tenderness or lesions; Vagina: normal vagina; Adnexa: surgically absent bilateral; Rectal: normal    Assessment:  Natalie Stone is a 84 y.o. female diagnosed with advanced ovarian cancer treated with neoadjuvant chemo and interval debulking in 2013 in [place].  NED but CA125 has been rising despite normal scans and lack of symptoms.    Urge incontinence s/p surgery for SUI in the past.  Now uses Mirabegron 50 mg qd (a B-agonist) for urge incontinence per Dr Buel Ream suggestion with good results and no symptoms.   Plan:   Problem List Items Addressed This Visit      Genitourinary   Malignant neoplasm of ovary [facility]) - Primary     Reviewed the May 2019 CT scan with radiology and there is still nothing that  is amenable to biopsy.  Will check MRI of the brain with contrast to rule out recurrence in the brain or CT scan if she cannot have MRI due to pacemaker.  She will RTC in 2 months with CA125 if scan normal.  Again discussed that since she is asymptomatic there is no urgency in diagnosing and treating an abdominal recurrence of ovarian cancer, as it will likely still be just as responsive to treatment.  Do not think it is worth doing a laparoscopy at this point in view of her age and asymptomatic status.   Can return sooner if new concerning symptoms.  Repeat scan  scheduled for September due to 4 mm nonspecific lung nodule.   The patient's diagnosis, an outline of the further diagnostic and laboratory studies which will be required, the recommendation, and alternatives were discussed.  All questions were answered to the patient's satisfaction.  Mellody Drown, MD  CC:  Rusty Aus, MD [facility] [address], [place] [address] [phone]

## 2017-08-08 DIAGNOSIS — I442 Atrioventricular block, complete: Secondary | ICD-10-CM | POA: Diagnosis not present

## 2017-08-14 DIAGNOSIS — Z950 Presence of cardiac pacemaker: Secondary | ICD-10-CM | POA: Diagnosis not present

## 2017-08-14 DIAGNOSIS — E782 Mixed hyperlipidemia: Secondary | ICD-10-CM | POA: Diagnosis not present

## 2017-08-14 DIAGNOSIS — I2510 Atherosclerotic heart disease of native coronary artery without angina pectoris: Secondary | ICD-10-CM | POA: Diagnosis not present

## 2017-08-14 DIAGNOSIS — I700 Atherosclerosis of aorta: Secondary | ICD-10-CM | POA: Diagnosis not present

## 2017-08-15 ENCOUNTER — Ambulatory Visit (HOSPITAL_COMMUNITY): Payer: Medicare HMO

## 2017-08-20 ENCOUNTER — Encounter: Payer: Self-pay | Admitting: Obstetrics and Gynecology

## 2017-08-21 ENCOUNTER — Ambulatory Visit (HOSPITAL_COMMUNITY)
Admission: RE | Admit: 2017-08-21 | Discharge: 2017-08-21 | Disposition: A | Discharge status: Home / Self Care | Payer: Medicare HMO | Source: Ambulatory Visit | Attending: Obstetrics and Gynecology | Admitting: Obstetrics and Gynecology

## 2017-08-21 DIAGNOSIS — C569 Malignant neoplasm of unspecified ovary: Secondary | ICD-10-CM | POA: Diagnosis not present

## 2017-08-21 DIAGNOSIS — G9389 Other specified disorders of brain: Secondary | ICD-10-CM | POA: Diagnosis not present

## 2017-08-21 LAB — CREATININE, SERUM
Creatinine, Ser: 0.72 mg/dL (ref 0.44–1.00)
GFR calc Af Amer: >60 mL/min (ref >60)
GFR calc non Af Amer: >60 mL/min (ref >60)

## 2017-08-21 MED ORDER — GADOBENATE DIMEGLUMINE 529 MG/ML IV SOLN
12.0000 mL | Freq: Once | INTRAVENOUS | Status: AC | PRN
  Administered 2017-08-21: 12 mL via INTRAVENOUS

## 2017-08-27 ENCOUNTER — Telehealth: Payer: Self-pay

## 2017-08-27 NOTE — Telephone Encounter (Signed)
Called and notified Natalie Stone of MRI results. She has follow up arranged for 10/09/2017. She is feeling well. No questions at this time.   IMPRESSION: Normal aging brain.  Oncology Nurse Navigator Documentation  Navigator Location: [facility] (08/27/17 1500)   )Navigator Encounter Type: Telephone;Diagnostic Results (08/27/17 1500) Telephone: Outgoing Call;Diagnostic Results (08/27/17 1500)                                                  Time Spent with Patient: 15 (08/27/17 1500)

## 2017-09-30 ENCOUNTER — Inpatient Hospital Stay: Payer: Medicare HMO | Attending: Obstetrics and Gynecology

## 2017-09-30 DIAGNOSIS — C569 Malignant neoplasm of unspecified ovary: Secondary | ICD-10-CM | POA: Diagnosis not present

## 2017-10-01 LAB — CA 125: CANCER ANTIGEN (CA) 125: 297 U/mL — AB (ref 0.0–38.1)

## 2017-10-02 ENCOUNTER — Inpatient Hospital Stay: Payer: Medicare HMO | Attending: Obstetrics and Gynecology | Admitting: Obstetrics and Gynecology

## 2017-10-02 ENCOUNTER — Inpatient Hospital Stay: Payer: Medicare HMO

## 2017-10-02 VITALS — BP 106/64 | HR 84 | Temp 98.9°F | Resp 18 | Ht 62.0 in | Wt 121.8 lb

## 2017-10-02 DIAGNOSIS — C569 Malignant neoplasm of unspecified ovary: Secondary | ICD-10-CM

## 2017-10-02 NOTE — Progress Notes (Signed)
No GYN changes noted today . The patient c/o tired, fatigue, feeling tired and problems with bladder function.

## 2017-10-02 NOTE — Addendum Note (Signed)
Addended by: Vito Berger on: 10/02/2017 04:04 PM   Modules accepted: Orders

## 2017-10-02 NOTE — Progress Notes (Signed)
Gynecologic Oncology Consult Visit   Referring Provider: Dr. Rogue Bussing  Chief Concern: ovarian cancer surveillance  Subjective:  Natalie Stone is a 84 y.o. female seen in consultation from Dr. Rogue Bussing for follow up of ovarian cancer.   No complaints today.  No GI, GU or neurological complaints. Here for follow up exam and CA125 in view of rising levels.  Since 10/18 CA125 has risen from 33 to 42 to 56 to 174 to 210 in June and now 297 this week.     In 5/19 the In vitae 83 gene panel was negative for pathogenic mutations. Had one VUS in the MET gene.   06/27/17 CT C/A/P IMPRESSION: 1. There is a small amount of complex fluid within the pelvis with peripheral peritoneal enhancement. Findings are indeterminate but concerning for the possibility of recurrent disease. 2. Additionally within the pelvis there is a small hyperdense nodule which likely represents a hyperdense diverticulum as opposed to a peritoneal nodule as no definite additional peritoneal nodules are identified.  Recommend attention on follow-up. 3. Slight interval decrease in size of right middle lobe nodule, likely resolving infectious/inflammatory process. Recommend attention on follow-up.  Reviewed the May 2019 CT scan with radiology and there is nothing that is amenable to biopsy.   05/01/2017- CT C/A/P IMPRESSION: -Status post hysterectomy and suspected bilateral salpingo-oophorectomy. - No findings specific for recurrent or metastatic disease.  - Benign cystic lesion in the right retrocrural space is likely related to the thoracic duct. This is not considered suspicious for a lymph node. - 4 mm subpleural node in the posterior right middle lobe, new, nonspecific. This appearance is favored to be infectious/inflammatory. However, consider follow-up CT chest in 6 months.  02/05/2017- CT C/A/P IMPRESSION: 1. 14 x 13 mm low-density oval mass in the right retrocrural location at the level of the aortic hiatus. This  may reflect a mildly enlarged retrocrural lymph node. This could potentially reflect metastatic adenopathy. It could reflect a small lymphangiocele or chronic reactive lymph node. If there are any previous CT exams available for comparison, these could help exclude metastatic disease. If not, follow-up chest CT in 2-3 months to reassess for stability would be recommended. 2. No other findings to suggest metastatic disease. No evidence of locally recurrent ovarian carcinoma. Status post hysterectomy. 3. No acute findings. 4. Coronary artery calcifications.  Aortic atherosclerosis. 5. Mild increased colonic stool burden.  Oncology History Ovarian cancer- stage III per patient [no records available]. Status post neoadjuvant chemotherapy followed by surgery with Dr Eppie Gibson in [place] in 2013; followed by adjuvant chemotherapy. Clinically no concerns for recurrence at this time.  She said she had negative genetic testing.  Establish care at [facility] in 2017 since she moved back to this area to be near family.  She is grandmother of Dr. Benjaman Kindler.  CA125 3/18 =13.7  Mild SUI.  No other symptoms.  She had bladder repair many years ago for SUI after having children.  Has been treated with anticholenergics in past, but mouth was very dry and had problems talking so stopped the medication.  Now uses Mirabegron 50 mg qd (a B-agonist) for urge incontinence per Dr Buel Ream suggestion.  No symptoms since she started this medication.   Problem List: Patient Active Problem List   Diagnosis Date Noted  . Malignant neoplasm of ovary ([place]) 11/23/2015  . Acquired hypothyroidism 10/26/2015  . Aortic atherosclerosis ([place]) 10/26/2015  . Cardiac pacemaker 10/26/2015  . Chemotherapy-induced neuropathy ([place]) 10/26/2015  . Coronary artery disease involving  native coronary artery of native heart without angina pectoris 10/26/2015  . Pacemaker pocket hematoma 10/26/2015  . Status post placement of cardiac  pacemaker 10/26/2015  . Bradycardia 10/09/2015  . Third degree AV block ([place]) 10/09/2015    Past Medical History: Past Medical History:  Diagnosis Date  . CAD (coronary artery disease)   . Chronic kidney disease    urinary incontinence  . Hyperthyroidism   . Hypothyroidism (acquired)   . Ovarian cancer ([place]) 2014  . Shingles   . Third degree AV block ([place]) 10/09/2015    Past Surgical History: Past Surgical History:  Procedure Laterality Date  . ABDOMINAL HYSTERECTOMY    . BLADDER REPAIR    . PACEMAKER INSERTION N/A 10/11/2015   Procedure: INSERTION PACEMAKER;  Surgeon: [name], MD;  Location: [facility] ORS;  Service: Cardiovascular;  Laterality: N/A;  . PARTIAL HYSTERECTOMY       Family History: Family History  Problem Relation Age of Onset  . Diabetes Brother     Social History: Social History   Socioeconomic History  . Marital status: Married    Spouse name: Not on file  . Number of children: Not on file  . Years of education: Not on file  . Highest education level: Not on file  Occupational History  . Not on file  Social Needs  . Financial resource strain: Not on file  . Food insecurity:    Worry: Not on file    Inability: Not on file  . Transportation needs:    Medical: Not on file    Non-medical: Not on file  Tobacco Use  . Smoking status: Never Smoker  . Smokeless tobacco: Never Used  Substance and Sexual Activity  . Alcohol use: No  . Drug use: No  . Sexual activity: Never  Lifestyle  . Physical activity:    Days per week: Not on file    Minutes per session: Not on file  . Stress: Not on file  Relationships  . Social connections:    Talks on phone: Not on file    Gets together: Not on file    Attends religious service: Not on file    Active member of club or organization: Not on file    Attends meetings of clubs or organizations: Not on file    Relationship status: Not on file  . Intimate partner violence:    Fear of current or ex  partner: Not on file    Emotionally abused: Not on file    Physically abused: Not on file    Forced sexual activity: Not on file  Other Topics Concern  . Not on file  Social History Narrative  . Not on file    Allergies: Allergies  Allergen Reactions  . Penicillins Anaphylaxis    Has patient had a PCN reaction causing immediate rash, facial/tongue/throat swelling, SOB or lightheadedness with hypotension: Yes Has patient had a PCN reaction causing severe rash involving mucus membranes or skin necrosis: No Has patient had a PCN reaction that required hospitalization No Has patient had a PCN reaction occurring within the last 10 years: No If all of the above answers are "NO", then may proceed with Cephalosporin use.  . Ativan [Lorazepam] Other (See Comments)    Made her not remember for many hours.  . Benadryl [Diphenhydramine] Other (See Comments)    Made her sleep for hours.    Current Medications: Current Outpatient Medications  Medication Sig Dispense Refill  . aspirin EC 81 MG tablet  Take 81 mg by mouth daily.    Marland Kitchen atorvastatin (LIPITOR) 20 MG tablet Take 10 mg by mouth at bedtime.    Marland Kitchen b complex vitamins tablet Take 1 tablet by mouth daily.    Marland Kitchen Bioflavonoid Products (ESTER C PO) Take 500 mg by mouth daily.     . Cholecalciferol (VITAMIN D3) 1000 units CAPS Take by mouth daily.     Marland Kitchen gabapentin (NEURONTIN) 100 MG capsule Take 100 mg by mouth 2 (two) times daily. 2 pills in am and 4 pills at night    . glucosamine-chondroitin 500-400 MG tablet Take 1 tablet by mouth 2 (two) times daily.     Marland Kitchen levothyroxine (SYNTHROID, LEVOTHROID) 88 MCG tablet Take 88 mcg by mouth at bedtime.     . mirabegron ER (MYRBETRIQ) 50 MG TB24 tablet Take 1 tablet (50 mg total) by mouth daily. 30 tablet 5   No current facility-administered medications for this visit.     Review of Systems General: fatigue Skin: no complaints Eyes: no complaints HEENT: no complaints Breasts: no  complaints Pulmonary: no complaints Cardiac: no complaints Gastrointestinal: no complaints Genitourinary/Sexual: problems with bladder function (chronic) Ob/Gyn: no complaints Musculoskeletal: no complaints Hematology: no complaints Neurologic/Psych: no complaints   Objective:  Physical Examination:  BP 106/64 (Patient Position: Sitting)   Pulse 84   Temp 98.9 F (37.2 C) (Tympanic)   Resp 18   Ht 5' 2"  (1.575 m)   Wt 121 lb 12.8 oz (55.2 kg)   SpO2 96%   BMI 22.28 kg/m    ECOG Performance Status: 0 - Asymptomatic  General appearance: alert, cooperative and appears stated age HEENT:PERRLA and neck supple with midline trachea Lymph node survey: non-palpable, axillary, inguinal, supraclavicular Cardiovascular: regular rate and rhythm, no murmurs or gallops Respiratory: normal air entry, lungs clear to auscultation and no rales, rhonchi or wheezing Breast exam: not examined. Abdomen: soft, non-tender, without masses or organomegaly, no hernias and well healed incision Back: inspection of back is normal Extremities: extremities normal, atraumatic, no cyanosis or edema Skin exam - normal coloration and turgor, no rashes, no suspicious skin lesions noted. Neurological exam reveals alert, oriented, normal speech, no focal findings or movement disorder noted.  Pelvic: exam chaperoned by nurse;  Vulva: normal appearing vulva with no masses, tenderness or lesions; Vagina: normal vagina; Adnexa: surgically absent bilateral; Rectal: normal    Assessment:  Natalie Stone is a 84 y.o. female diagnosed with advanced ovarian cancer treated with neoadjuvant chemo and interval debulking in 2013 in [place].  NED and asymptomatic but CA125 has been rising despite normal CT scans and MRI of brain.    Urge incontinence s/p surgery for SUI in the past.  Now uses Mirabegron 50 mg qd (a B-agonist) for urge incontinence per Dr Buel Ream suggestion with good results and no symptoms.   Plan:    Problem List Items Addressed This Visit      Genitourinary   Malignant neoplasm of ovary ([place]) - Primary     We discussed that she will RTC in 2 months with CA125 follow up.   Again discussed that since she is asymptomatic there is no urgency in diagnosing and treating an abdominal recurrence of ovarian cancer, as it will likely still be just as responsive to treatment.  Do not think it is worth doing a laparoscopy at this point in view of her age and asymptomatic status.   Can return sooner if new concerning symptoms.  Repeat scan scheduled for September due to 4  mm nonspecific lung nodule.   The patient's diagnosis, an outline of the further diagnostic and laboratory studies which will be required, the recommendation, and alternatives were discussed.  All questions were answered to the patient's satisfaction.  Mellody Drown, MD  CC:  Rusty Aus, MD [facility] [address] [place], [place] [address] [phone]

## 2017-10-07 ENCOUNTER — Other Ambulatory Visit: Payer: Medicare HMO

## 2017-10-09 ENCOUNTER — Ambulatory Visit: Payer: Medicare HMO

## 2017-10-29 ENCOUNTER — Telehealth: Payer: Self-pay | Admitting: *Deleted

## 2017-10-29 NOTE — Telephone Encounter (Signed)
Patient's call returned r/t questions about CT scan, patient instructed to eat a light breakfast and then only liquids prior to CT scan, voiced understanding.

## 2017-10-30 ENCOUNTER — Ambulatory Visit
Admission: RE | Admit: 2017-10-30 | Discharge: 2017-10-30 | Disposition: A | Discharge status: Home / Self Care | Payer: Medicare HMO | Source: Ambulatory Visit | Attending: Nurse Practitioner | Admitting: Nurse Practitioner

## 2017-10-30 DIAGNOSIS — C569 Malignant neoplasm of unspecified ovary: Secondary | ICD-10-CM | POA: Diagnosis not present

## 2017-10-30 DIAGNOSIS — R911 Solitary pulmonary nodule: Secondary | ICD-10-CM | POA: Insufficient documentation

## 2017-10-30 DIAGNOSIS — I700 Atherosclerosis of aorta: Secondary | ICD-10-CM | POA: Insufficient documentation

## 2017-10-30 DIAGNOSIS — I2510 Atherosclerotic heart disease of native coronary artery without angina pectoris: Secondary | ICD-10-CM | POA: Diagnosis not present

## 2017-10-30 LAB — POCT I-STAT CREATININE: Creatinine, Ser: 0.7 mg/dL (ref 0.44–1.00)

## 2017-10-30 MED ORDER — IOPAMIDOL (ISOVUE-300) INJECTION 61%
75.0000 mL | Freq: Once | INTRAVENOUS | Status: AC | PRN
  Administered 2017-10-30: 75 mL via INTRAVENOUS

## 2017-10-31 ENCOUNTER — Telehealth: Payer: Self-pay | Admitting: Nurse Practitioner

## 2017-10-31 NOTE — Telephone Encounter (Signed)
Call patient for CT results.  No answer.  Left message for patient to return call with following:   ---Called to discuss results of recent CT scan of chest to follow up on previously lung nodule.  On new imaging, nodule is same to slightly smaller and felt to be benign.  No evidence of metastatic disease.  Patient has follow-up appointment scheduled with GYN oncology on 11/27/2017.

## 2017-10-31 NOTE — Telephone Encounter (Signed)
Patient returned call and results of CT shared.  All questions answered.

## 2017-11-01 DIAGNOSIS — C561 Malignant neoplasm of right ovary: Secondary | ICD-10-CM | POA: Diagnosis not present

## 2017-11-01 DIAGNOSIS — E039 Hypothyroidism, unspecified: Secondary | ICD-10-CM | POA: Diagnosis not present

## 2017-11-01 DIAGNOSIS — C562 Malignant neoplasm of left ovary: Secondary | ICD-10-CM | POA: Diagnosis not present

## 2017-11-01 DIAGNOSIS — M818 Other osteoporosis without current pathological fracture: Secondary | ICD-10-CM | POA: Diagnosis not present

## 2017-11-01 DIAGNOSIS — E782 Mixed hyperlipidemia: Secondary | ICD-10-CM | POA: Diagnosis not present

## 2017-11-07 DIAGNOSIS — E782 Mixed hyperlipidemia: Secondary | ICD-10-CM | POA: Diagnosis not present

## 2017-11-07 DIAGNOSIS — R5383 Other fatigue: Secondary | ICD-10-CM | POA: Diagnosis not present

## 2017-11-07 DIAGNOSIS — Z0000 Encounter for general adult medical examination without abnormal findings: Secondary | ICD-10-CM | POA: Diagnosis not present

## 2017-11-07 DIAGNOSIS — E039 Hypothyroidism, unspecified: Secondary | ICD-10-CM | POA: Diagnosis not present

## 2017-11-27 ENCOUNTER — Inpatient Hospital Stay (HOSPITAL_BASED_OUTPATIENT_CLINIC_OR_DEPARTMENT_OTHER): Payer: Medicare HMO | Admitting: Obstetrics and Gynecology

## 2017-11-27 ENCOUNTER — Other Ambulatory Visit: Payer: Self-pay | Admitting: Nurse Practitioner

## 2017-11-27 ENCOUNTER — Inpatient Hospital Stay: Payer: Medicare HMO | Attending: Obstetrics and Gynecology

## 2017-11-27 VITALS — BP 119/65 | HR 71 | Temp 98.3°F | Resp 18 | Ht 62.0 in | Wt 119.4 lb

## 2017-11-27 DIAGNOSIS — Z08 Encounter for follow-up examination after completed treatment for malignant neoplasm: Secondary | ICD-10-CM

## 2017-11-27 DIAGNOSIS — Z8543 Personal history of malignant neoplasm of ovary: Secondary | ICD-10-CM | POA: Insufficient documentation

## 2017-11-27 DIAGNOSIS — Z90710 Acquired absence of both cervix and uterus: Secondary | ICD-10-CM

## 2017-11-27 DIAGNOSIS — Z90722 Acquired absence of ovaries, bilateral: Secondary | ICD-10-CM | POA: Insufficient documentation

## 2017-11-27 DIAGNOSIS — R971 Elevated cancer antigen 125 [CA 125]: Secondary | ICD-10-CM

## 2017-11-27 DIAGNOSIS — C569 Malignant neoplasm of unspecified ovary: Secondary | ICD-10-CM

## 2017-11-27 DIAGNOSIS — Z9221 Personal history of antineoplastic chemotherapy: Secondary | ICD-10-CM | POA: Insufficient documentation

## 2017-11-27 NOTE — Progress Notes (Signed)
No gyn sx. Wanted to see where she is as far as status of her cancer

## 2017-11-27 NOTE — Progress Notes (Signed)
Gynecologic Oncology Consult Visit   Referring Provider: Dr. Rogue Bussing  Chief Concern: ovarian cancer surveillance  Subjective:  Natalie Stone is a 84 y.o. female seen in consultation from Dr. Rogue Bussing for follow up of ovarian cancer.   No complaints today.  No GI, GU or neurological complaints. Here for follow up exam and CA125 in view of rising levels.  Since 10/18 CA125 has risen from 33 to 42 to 56 to 174 to 210 in June and 297 in August.  CA125 today is pending.  Last screening colonoscopy 2013.   In 5/19 the In vitae 83 gene panel was negative for pathogenic mutations. Had one VUS in the MET gene.   CT chest 9/11 MPRESSION: 1. No findings suspicious for metastatic disease in the chest. Solitary tiny 2 mm right middle lobe pulmonary nodule is again slightly decreased and probably benign. 2. Coronary atherosclerosis.   Oncology History Ovarian cancer- stage III per patient [no records available]. Status post neoadjuvant chemotherapy followed by surgery with Dr Eppie Gibson in [place] in 2013; followed by adjuvant chemotherapy. Clinically no concerns for recurrence at this time.  She said she had negative genetic testing.  Establish care at [facility] in 2017 since she moved back to this area to be near family.  She is grandmother of Dr. Benjaman Kindler.  CA125 3/18 =13.7  02/05/2017- CT C/A/P IMPRESSION: 1. 14 x 13 mm low-density oval mass in the right retrocrural location at the level of the aortic hiatus. This may reflect a mildly enlarged retrocrural lymph node. This could potentially reflect metastatic adenopathy. It could reflect a small lymphangiocele or chronic reactive lymph node. If there are any previous CT exams available for comparison, these could help exclude metastatic disease. If not, follow-up chest CT in 2-3 months to reassess for stability would be recommended. 2. No other findings to suggest metastatic disease. No evidence of locally recurrent ovarian carcinoma. Status  post hysterectomy. 3. No acute findings. 4. Coronary artery calcifications.  Aortic atherosclerosis. 5. Mild increased colonic stool burden.  CA125 1/19 = 56.5  05/01/2017- CT C/A/P IMPRESSION: -Status post hysterectomy and suspected bilateral salpingo-oophorectomy. - No findings specific for recurrent or metastatic disease.  - Benign cystic lesion in the right retrocrural space is likely related to the thoracic duct. This is not considered suspicious for a lymph node. - 4 mm subpleural node in the posterior right middle lobe, new, nonspecific. This appearance is favored to be infectious/inflammatory. However, consider follow-up CT chest in 6 months.  CA125 5/19 = 174.9   06/27/17 CT C/A/P IMPRESSION: 1. There is a small amount of complex fluid within the pelvis with peripheral peritoneal enhancement. Findings are indeterminate but concerning for the possibility of recurrent disease. 2. Additionally within the pelvis there is a small hyperdense nodule which likely represents a hyperdense diverticulum as opposed to a peritoneal nodule as no definite additional peritoneal nodules are identified.  Recommend attention on follow-up.  3. Slight interval decrease in size of right middle lobe nodule, likely resolving infectious/inflammatory process. Recommend attention on follow-up.  Reviewed the May 2019 CT scan with radiology and there is nothing that is amenable to biopsy.   6/19 CA125 = 210.2  Mild SUI.  She had bladder repair many years ago for SUI after having children.  Has been treated with anticholenergics in past, but mouth was very dry and had problems talking so stopped the medication.  Now uses Mirabegron 50 mg qd (a B-agonist) for urge incontinence per Dr Buel Ream suggestion.  No  symptoms since she started this medication.   Problem List: Patient Active Problem List   Diagnosis Date Noted  . Malignant neoplasm of ovary ([place]) 11/23/2015  . Acquired hypothyroidism 10/26/2015  .  Aortic atherosclerosis ([place]) 10/26/2015  . Cardiac pacemaker 10/26/2015  . Chemotherapy-induced neuropathy ([place]) 10/26/2015  . Coronary artery disease involving native coronary artery of native heart without angina pectoris 10/26/2015  . Pacemaker pocket hematoma 10/26/2015  . Status post placement of cardiac pacemaker 10/26/2015  . Bradycardia 10/09/2015  . Third degree AV block ([place]) 10/09/2015    Past Medical History: Past Medical History:  Diagnosis Date  . CAD (coronary artery disease)   . Chronic kidney disease    urinary incontinence  . Hyperthyroidism   . Hypothyroidism (acquired)   . Ovarian cancer ([place]) 2014  . Shingles   . Third degree AV block ([place]) 10/09/2015    Past Surgical History: Past Surgical History:  Procedure Laterality Date  . ABDOMINAL HYSTERECTOMY    . BLADDER REPAIR    . CORONARY ANGIOPLASTY WITH STENT PLACEMENT  2001   Dr. Isaiah Blakes in [place]  . PACEMAKER INSERTION N/A 10/11/2015   Procedure: INSERTION PACEMAKER;  Surgeon: [name], MD;  Location: [facility] ORS;  Service: Cardiovascular;  Laterality: N/A;  . PARTIAL HYSTERECTOMY       Family History: Family History  Problem Relation Age of Onset  . Diabetes Brother     Social History: Social History   Socioeconomic History  . Marital status: Married    Spouse name: Not on file  . Number of children: Not on file  . Years of education: Not on file  . Highest education level: Not on file  Occupational History  . Not on file  Social Needs  . Financial resource strain: Not on file  . Food insecurity:    Worry: Not on file    Inability: Not on file  . Transportation needs:    Medical: Not on file    Non-medical: Not on file  Tobacco Use  . Smoking status: Never Smoker  . Smokeless tobacco: Never Used  Substance and Sexual Activity  . Alcohol use: No  . Drug use: No  . Sexual activity: Never  Lifestyle  . Physical activity:    Days per week: Not on file    Minutes per  session: Not on file  . Stress: Not on file  Relationships  . Social connections:    Talks on phone: Not on file    Gets together: Not on file    Attends religious service: Not on file    Active member of club or organization: Not on file    Attends meetings of clubs or organizations: Not on file    Relationship status: Not on file  . Intimate partner violence:    Fear of current or ex partner: Not on file    Emotionally abused: Not on file    Physically abused: Not on file    Forced sexual activity: Not on file  Other Topics Concern  . Not on file  Social History Narrative  . Not on file    Allergies: Allergies  Allergen Reactions  . Penicillins Anaphylaxis    Has patient had a PCN reaction causing immediate rash, facial/tongue/throat swelling, SOB or lightheadedness with hypotension: Yes Has patient had a PCN reaction causing severe rash involving mucus membranes or skin necrosis: No Has patient had a PCN reaction that required hospitalization No Has patient had a PCN reaction occurring within the  last 10 years: No If all of the above answers are "NO", then may proceed with Cephalosporin use.  . Ativan [Lorazepam] Other (See Comments)    Made her not remember for many hours.  . Benadryl [Diphenhydramine] Other (See Comments)    Made her sleep for hours.    Current Medications: Current Outpatient Medications  Medication Sig Dispense Refill  . aspirin EC 81 MG tablet Take 81 mg by mouth daily.    Marland Kitchen atorvastatin (LIPITOR) 20 MG tablet Take 10 mg by mouth at bedtime.    Marland Kitchen Bioflavonoid Products (ESTER C PO) Take 500 mg by mouth daily.     . Cholecalciferol (VITAMIN D3) 1000 units CAPS Take by mouth daily.     Marland Kitchen glucosamine-chondroitin 500-400 MG tablet Take 1 tablet by mouth 2 (two) times daily.     Marland Kitchen levothyroxine (SYNTHROID, LEVOTHROID) 88 MCG tablet Take 88 mcg by mouth at bedtime.     Marland Kitchen b complex vitamins tablet Take 1 tablet by mouth daily.    Marland Kitchen gabapentin (NEURONTIN)  100 MG capsule Take 100 mg by mouth 2 (two) times daily. 2 pills in am and 4 pills at night     No current facility-administered medications for this visit.     Review of Systems General: fatigue Skin: no complaints Eyes: no complaints HEENT: no complaints Breasts: no complaints Pulmonary: no complaints Cardiac: no complaints Gastrointestinal: no complaints Genitourinary/Sexual: problems with bladder function (chronic) Ob/Gyn: no complaints Musculoskeletal: no complaints Hematology: no complaints Neurologic/Psych: no complaints   Objective:  Physical Examination:  BP 119/65   Pulse 71   Temp 98.3 F (36.8 C) (Oral)   Resp 18   Ht 5' 2"  (1.575 m)   Wt 119 lb 6.4 oz (54.2 kg)   BMI 21.84 kg/m    ECOG Performance Status: 0 - Asymptomatic  General appearance: alert, cooperative and appears stated age HEENT:PERRLA and neck supple with midline trachea Lymph node survey: non-palpable, axillary, inguinal, supraclavicular Cardiovascular: regular rate and rhythm, no murmurs or gallops Respiratory: normal air entry, lungs clear to auscultation and no rales, rhonchi or wheezing Breast exam: not examined. Abdomen: soft, non-tender, without masses or organomegaly, no hernias and well healed incision Back: inspection of back is normal Extremities: extremities normal, atraumatic, no cyanosis or edema Skin exam - normal coloration and turgor, no rashes, no suspicious skin lesions noted. Neurological exam reveals alert, oriented, normal speech, no focal findings or movement disorder noted.  Pelvic: exam chaperoned by nurse;  Vulva: normal appearing vulva with no masses, tenderness or lesions; Vagina: normal vagina; Adnexa: surgically absent bilateral; Rectal: normal    Assessment:  Natalie Stone is a 84 y.o. female diagnosed with advanced ovarian cancer treated with neoadjuvant chemo and interval debulking in 2013 in [place].  NED and asymptomatic but CA125 has been rising despite  normal CT scans and MRI of brain.    CT scan of chest recently reassuring with small nodule less noticeable.   Urge incontinence s/p surgery for SUI in the past.  Now uses Mirabegron 50 mg qd (a B-agonist) for urge incontinence per Dr Buel Ream suggestion with good results and no symptoms.   Plan:   Problem List Items Addressed This Visit      Endocrine   Malignant neoplasm of ovary ([place]) - Primary     We discussed that if CA125 is continuing to rise will do another CT A/P next month since the last one was 6 months ago.  Again discussed that since she is  asymptomatic there is no urgency in diagnosing and treating an abdominal recurrence of ovarian cancer, as it will likely still be just as responsive to treatment when detected later.  Do not think it is worth doing a laparoscopy at this point in view of her age and asymptomatic status.   Will plan for RTC in 2 months.  Can return sooner if new concerning symptoms.    The patient's diagnosis, an outline of the further diagnostic and laboratory studies which will be required, the recommendation, and alternatives were discussed.  All questions were answered to the patient's satisfaction.  Mellody Drown, MD  CC:  Rusty Aus, MD [facility] [place], [place] [address] [phone]

## 2017-11-28 ENCOUNTER — Telehealth: Payer: Self-pay | Admitting: Nurse Practitioner

## 2017-11-28 LAB — CA 125: CANCER ANTIGEN (CA) 125: 334 U/mL — AB (ref 0.0–38.1)

## 2017-11-28 NOTE — Telephone Encounter (Signed)
Called patient, spoke to Natalie Stone, with results of Ca 125 which has risen.  CT scan scheduled for 12/23/2017 and patient has follow-up appointment.

## 2017-12-04 ENCOUNTER — Ambulatory Visit: Payer: Medicare HMO

## 2017-12-10 ENCOUNTER — Other Ambulatory Visit: Payer: Self-pay | Admitting: Nurse Practitioner

## 2017-12-10 DIAGNOSIS — C569 Malignant neoplasm of unspecified ovary: Secondary | ICD-10-CM

## 2017-12-23 ENCOUNTER — Ambulatory Visit
Admission: RE | Admit: 2017-12-23 | Discharge: 2017-12-23 | Disposition: A | Discharge status: Home / Self Care | Payer: Medicare HMO | Source: Ambulatory Visit | Attending: Nurse Practitioner | Admitting: Nurse Practitioner

## 2017-12-23 DIAGNOSIS — I700 Atherosclerosis of aorta: Secondary | ICD-10-CM | POA: Diagnosis not present

## 2017-12-23 DIAGNOSIS — Z8543 Personal history of malignant neoplasm of ovary: Secondary | ICD-10-CM | POA: Insufficient documentation

## 2017-12-23 DIAGNOSIS — C569 Malignant neoplasm of unspecified ovary: Secondary | ICD-10-CM

## 2017-12-23 DIAGNOSIS — I712 Thoracic aortic aneurysm, without rupture: Secondary | ICD-10-CM | POA: Diagnosis not present

## 2017-12-23 LAB — POCT I-STAT CREATININE: Creatinine, Ser: 0.7 mg/dL (ref 0.44–1.00)

## 2017-12-23 MED ORDER — IOPAMIDOL (ISOVUE-300) INJECTION 61%
75.0000 mL | Freq: Once | INTRAVENOUS | Status: AC | PRN
  Administered 2017-12-23: 60 mL via INTRAVENOUS

## 2017-12-25 ENCOUNTER — Ambulatory Visit: Payer: Medicare HMO

## 2017-12-25 ENCOUNTER — Other Ambulatory Visit: Payer: Medicare HMO

## 2017-12-26 ENCOUNTER — Telehealth: Payer: Self-pay

## 2017-12-26 NOTE — Telephone Encounter (Signed)
Voicemail left with Natalie Stone to return call for CT scan results. Oncology Nurse Navigator Documentation  Navigator Location: [facility] (12/26/17 0900)   )Navigator Encounter Type: Telephone;Diagnostic Results (12/26/17 0900) Telephone: Outgoing Call;Diagnostic Results (12/26/17 0900)                                                  Time Spent with Patient: 15 (12/26/17 0900)

## 2018-01-27 DIAGNOSIS — N3090 Cystitis, unspecified without hematuria: Secondary | ICD-10-CM | POA: Diagnosis not present

## 2018-01-27 DIAGNOSIS — R634 Abnormal weight loss: Secondary | ICD-10-CM | POA: Diagnosis not present

## 2018-01-27 DIAGNOSIS — R5383 Other fatigue: Secondary | ICD-10-CM | POA: Diagnosis not present

## 2018-01-28 DIAGNOSIS — I442 Atrioventricular block, complete: Secondary | ICD-10-CM | POA: Diagnosis not present

## 2018-02-03 ENCOUNTER — Inpatient Hospital Stay: Payer: Medicare HMO

## 2018-02-03 DIAGNOSIS — C569 Malignant neoplasm of unspecified ovary: Secondary | ICD-10-CM | POA: Diagnosis not present

## 2018-02-03 DIAGNOSIS — Z90710 Acquired absence of both cervix and uterus: Secondary | ICD-10-CM | POA: Diagnosis not present

## 2018-02-03 DIAGNOSIS — R971 Elevated cancer antigen 125 [CA 125]: Secondary | ICD-10-CM | POA: Diagnosis not present

## 2018-02-03 DIAGNOSIS — Z90722 Acquired absence of ovaries, bilateral: Secondary | ICD-10-CM | POA: Insufficient documentation

## 2018-02-04 LAB — CA 125: Cancer Antigen (CA) 125: 865 U/mL — ABNORMAL HIGH (ref 0.0–38.1)

## 2018-02-05 ENCOUNTER — Other Ambulatory Visit: Payer: Self-pay

## 2018-02-05 ENCOUNTER — Inpatient Hospital Stay: Payer: Medicare HMO | Attending: Obstetrics and Gynecology | Admitting: Obstetrics and Gynecology

## 2018-02-05 VITALS — BP 102/60 | HR 96 | Temp 98.1°F | Resp 12 | Ht 61.0 in | Wt 116.3 lb

## 2018-02-05 DIAGNOSIS — R971 Elevated cancer antigen 125 [CA 125]: Secondary | ICD-10-CM | POA: Diagnosis not present

## 2018-02-05 DIAGNOSIS — C569 Malignant neoplasm of unspecified ovary: Secondary | ICD-10-CM | POA: Diagnosis not present

## 2018-02-05 DIAGNOSIS — Z90722 Acquired absence of ovaries, bilateral: Secondary | ICD-10-CM

## 2018-02-05 DIAGNOSIS — Z90710 Acquired absence of both cervix and uterus: Secondary | ICD-10-CM

## 2018-02-05 NOTE — Progress Notes (Signed)
Gynecologic Oncology Consult Visit   Referring Provider: Dr. Rogue Bussing  Chief Concern: ovarian cancer surveillance  Subjective:  Natalie Stone is a 84 y.o. female seen in consultation from Dr. Rogue Bussing for follow up of ovarian cancer.   No complaints today.  No GI, GU or neurological complaints. Here for follow up exam and CA125 in view of rising levels.  Since 10/18 CA125 has risen progressively from 33 to 865.  She has had multiple negative scans of the chest/abd/pelvis (last 11/19) and brain.  She continues to be asymptomatic.   Last screening colonoscopy 2013.    Component     Latest Ref Rng & Units 12/19/2016 01/22/2017 03/21/2017 06/19/2017  Cancer Antigen (CA) 125     0.0 - 38.1 U/mL 33.3 42.6 (H) 56.5 (H) 174.9 (H)   Component     Latest Ref Rng & Units 08/05/2017 09/30/2017 11/27/2017 02/03/2018  Cancer Antigen (CA) 125     0.0 - 38.1 U/mL 210.2 (H) 297.0 (H) 334.0 (H) 865.0 (H)    In 5/19 the In vitae 83 gene panel was negative for pathogenic mutations. Had one VUS in the MET gene.   CT chest 10/30/17 MPRESSION: 1. No findings suspicious for metastatic disease in the chest. Solitary tiny 2 mm right middle lobe pulmonary nodule is again slightly decreased and probably benign. 2. Coronary atherosclerosis.  CT abd/pelvis 11/19 IMPRESSION: 1. No acute abdominal/pelvic findings. 2. No abdominal/pelvic lymphadenopathy and no worrisome omental or peritoneal lesions are identified. No findings suspicious for recurrent tumor. 3. Advanced atherosclerotic disease involving the thoracic and abdominal aorta and branch vessels.   Oncology History Ovarian cancer- stage III per patient [no records available]. Status post neoadjuvant chemotherapy followed by surgery with Dr Eppie Gibson in [place] in 2013; followed by adjuvant chemotherapy. Clinically no concerns for recurrence at this time.  She said she had negative genetic testing.  Establish care at [facility] in 2017 since she moved back  to this area to be near family.  She is grandmother of Dr. Benjaman Kindler.  CA125 3/18 =13.7  02/05/2017- CT C/A/P IMPRESSION: 1. 14 x 13 mm low-density oval mass in the right retrocrural location at the level of the aortic hiatus. This may reflect a mildly enlarged retrocrural lymph node. This could potentially reflect metastatic adenopathy. It could reflect a small lymphangiocele or chronic reactive lymph node. If there are any previous CT exams available for comparison, these could help exclude metastatic disease. If not, follow-up chest CT in 2-3 months to reassess for stability would be recommended. 2. No other findings to suggest metastatic disease. No evidence of locally recurrent ovarian carcinoma. Status post hysterectomy. 3. No acute findings. 4. Coronary artery calcifications.  Aortic atherosclerosis. 5. Mild increased colonic stool burden.  CA125 1/19 = 56.5  05/01/2017- CT C/A/P IMPRESSION: -Status post hysterectomy and suspected bilateral salpingo-oophorectomy. - No findings specific for recurrent or metastatic disease.  - Benign cystic lesion in the right retrocrural space is likely related to the thoracic duct. This is not considered suspicious for a lymph node. - 4 mm subpleural node in the posterior right middle lobe, new, nonspecific. This appearance is favored to be infectious/inflammatory. However, consider follow-up CT chest in 6 months.  CA125 5/19 = 174.9   06/27/17 CT C/A/P IMPRESSION: 1. There is a small amount of complex fluid within the pelvis with peripheral peritoneal enhancement. Findings are indeterminate but concerning for the possibility of recurrent disease. 2. Additionally within the pelvis there is a small hyperdense nodule which likely  represents a hyperdense diverticulum as opposed to a peritoneal nodule as no definite additional peritoneal nodules are identified.  Recommend attention on follow-up.  3. Slight interval decrease in size of right middle  lobe nodule, likely resolving infectious/inflammatory process. Recommend attention on follow-up.  Reviewed the May 2019 CT scan with radiology and there is nothing that is amenable to biopsy.   6/19 CA125 = 210.2  Mild SUI.  She had bladder repair many years ago for SUI after having children.  Has been treated with anticholenergics in past, but mouth was very dry and had problems talking so stopped the medication.  Now uses Mirabegron 50 mg qd (a B-agonist) for urge incontinence per Dr Buel Ream suggestion.  No symptoms since she started this medication.   Problem List: Patient Active Problem List   Diagnosis Date Noted  . Malignant neoplasm of ovary ([place]) 11/23/2015  . Acquired hypothyroidism 10/26/2015  . Aortic atherosclerosis ([place]) 10/26/2015  . Cardiac pacemaker 10/26/2015  . Chemotherapy-induced neuropathy ([place]) 10/26/2015  . Coronary artery disease involving native coronary artery of native heart without angina pectoris 10/26/2015  . Pacemaker pocket hematoma 10/26/2015  . Status post placement of cardiac pacemaker 10/26/2015  . Bradycardia 10/09/2015  . Third degree AV block ([place]) 10/09/2015    Past Medical History: Past Medical History:  Diagnosis Date  . CAD (coronary artery disease)   . Chronic kidney disease    urinary incontinence  . Hyperthyroidism   . Hypothyroidism (acquired)   . Ovarian cancer ([place]) 2014  . Shingles   . Third degree AV block ([place]) 10/09/2015    Past Surgical History: Past Surgical History:  Procedure Laterality Date  . ABDOMINAL HYSTERECTOMY    . BLADDER REPAIR    . CORONARY ANGIOPLASTY WITH STENT PLACEMENT  2001   Dr. Isaiah Blakes in [place]  . PACEMAKER INSERTION N/A 10/11/2015   Procedure: INSERTION PACEMAKER;  Surgeon: [name], MD;  Location: [facility] ORS;  Service: Cardiovascular;  Laterality: N/A;  . PARTIAL HYSTERECTOMY       Family History: Family History  Problem Relation Age of Onset  . Diabetes Brother     Social  History: Social History   Socioeconomic History  . Marital status: Married    Spouse name: Not on file  . Number of children: Not on file  . Years of education: Not on file  . Highest education level: Not on file  Occupational History  . Not on file  Social Needs  . Financial resource strain: Not on file  . Food insecurity:    Worry: Not on file    Inability: Not on file  . Transportation needs:    Medical: Not on file    Non-medical: Not on file  Tobacco Use  . Smoking status: Never Smoker  . Smokeless tobacco: Never Used  Substance and Sexual Activity  . Alcohol use: No  . Drug use: No  . Sexual activity: Never  Lifestyle  . Physical activity:    Days per week: Not on file    Minutes per session: Not on file  . Stress: Not on file  Relationships  . Social connections:    Talks on phone: Not on file    Gets together: Not on file    Attends religious service: Not on file    Active member of club or organization: Not on file    Attends meetings of clubs or organizations: Not on file    Relationship status: Not on file  . Intimate partner violence:  Fear of current or ex partner: Not on file    Emotionally abused: Not on file    Physically abused: Not on file    Forced sexual activity: Not on file  Other Topics Concern  . Not on file  Social History Narrative  . Not on file    Allergies: Allergies  Allergen Reactions  . Penicillins Anaphylaxis    Has patient had a PCN reaction causing immediate rash, facial/tongue/throat swelling, SOB or lightheadedness with hypotension: Yes Has patient had a PCN reaction causing severe rash involving mucus membranes or skin necrosis: No Has patient had a PCN reaction that required hospitalization No Has patient had a PCN reaction occurring within the last 10 years: No If all of the above answers are "NO", then may proceed with Cephalosporin use.  . Ativan [Lorazepam] Other (See Comments)    Made her not remember for many  hours.  . Benadryl [Diphenhydramine] Other (See Comments)    Made her sleep for hours.    Current Medications: Current Outpatient Medications  Medication Sig Dispense Refill  . aspirin EC 81 MG tablet Take 81 mg by mouth daily.    Marland Kitchen atorvastatin (LIPITOR) 20 MG tablet Take 10 mg by mouth at bedtime.    Marland Kitchen b complex vitamins tablet Take 1 tablet by mouth daily.    Marland Kitchen Bioflavonoid Products (ESTER C PO) Take 500 mg by mouth daily.     . Cholecalciferol (VITAMIN D3) 1000 units CAPS Take by mouth daily.     Marland Kitchen gabapentin (NEURONTIN) 100 MG capsule Take 100 mg by mouth 2 (two) times daily. 2 pills in am and 4 pills at night    . glucosamine-chondroitin 500-400 MG tablet Take 1 tablet by mouth 2 (two) times daily.     Marland Kitchen levothyroxine (SYNTHROID, LEVOTHROID) 88 MCG tablet Take 88 mcg by mouth at bedtime.      No current facility-administered medications for this visit.     Review of Systems General: fatigue Skin: no complaints Eyes: no complaints HEENT: no complaints Breasts: no complaints Pulmonary: no complaints Cardiac: no complaints Gastrointestinal: no complaints Genitourinary/Sexual: problems with bladder function (chronic) Ob/Gyn: no complaints Musculoskeletal: no complaints Hematology: no complaints Neurologic/Psych: no complaints   Objective:  Physical Examination:  BP 102/60 (BP Location: Left Arm, Patient Position: Sitting)   Pulse 96   Temp 98.1 F (36.7 C) (Tympanic)   Resp 12   Ht _0  (1.549 m)   Wt 116 lb 4.8 oz (52.8 kg)   BMI 21.97 kg/m    ECOG Performance Status: 0 - Asymptomatic  General appearance: alert, cooperative and appears stated age HEENT:PERRLA and neck supple with midline trachea Lymph node survey: non-palpable, axillary, inguinal, supraclavicular Cardiovascular: regular rate and rhythm, no murmurs or gallops Respiratory: normal air entry, lungs clear to auscultation and no rales, rhonchi or wheezing Breast exam: not examined. Abdomen: soft,  non-tender, without masses or organomegaly, no hernias and well healed incision Back: inspection of back is normal Extremities: extremities normal, atraumatic, no cyanosis or edema Skin exam - normal coloration and turgor, no rashes, no suspicious skin lesions noted. Neurological exam reveals alert, oriented, normal speech, no focal findings or movement disorder noted.  Pelvic: exam chaperoned by nurse;  Vulva: normal appearing vulva with no masses, tenderness or lesions; Vagina: normal vagina; Adnexa: surgically absent bilateral; Rectal: normal, no masses.    Assessment:  Natalie Stone is a 84 y.o. female diagnosed with advanced ovarian cancer treated with neoadjuvant chemo and interval debulking in  2013 in [place].  Germline genetic testing normal.    NED and asymptomatic but CA125 has been rising despite normal CT scans and MRI of brain.    Urge incontinence s/p surgery for SUI in the past.  Now uses Mirabegron 50 mg qd (a B-agonist) for urge incontinence per Dr Buel Ream suggestion with good results and no symptoms.   Plan:   Problem List Items Addressed This Visit    None    Visit Diagnoses    Ovarian cancer, unspecified laterality ([place])    -  Primary   Relevant Orders   CA 125     We discussed that CA125 is continuing to rise, but will not do another CT presently.  Since she is asymptomatic there is no urgency in diagnosing and treating a recurrence of ovarian cancer, as it will likely still be just as responsive to treatment when detected later.  Do not think it is worth doing a laparoscopy in view of her age and asymptomatic status.   Will plan for RTC in 2 months with CA125.  Can return sooner if new concerning symptoms.    The patient's diagnosis, an outline of the further diagnostic and laboratory studies which will be required, the recommendation, and alternatives were discussed.  All questions were answered to the patient's satisfaction.  Mellody Drown, MD  CC:  Rusty Aus, MD [facility] [place], [place] [address] [phone]

## 2018-02-05 NOTE — Progress Notes (Signed)
Patient here for follow up. She is being treated for a bladder infection. She denies any GYN symptoms.

## 2018-02-06 DIAGNOSIS — Z950 Presence of cardiac pacemaker: Secondary | ICD-10-CM | POA: Diagnosis not present

## 2018-02-06 DIAGNOSIS — I700 Atherosclerosis of aorta: Secondary | ICD-10-CM | POA: Diagnosis not present

## 2018-02-06 DIAGNOSIS — I2510 Atherosclerotic heart disease of native coronary artery without angina pectoris: Secondary | ICD-10-CM | POA: Diagnosis not present

## 2018-02-06 DIAGNOSIS — E782 Mixed hyperlipidemia: Secondary | ICD-10-CM | POA: Diagnosis not present

## 2018-04-04 ENCOUNTER — Inpatient Hospital Stay: Payer: Medicare HMO | Attending: Obstetrics and Gynecology

## 2018-04-04 ENCOUNTER — Other Ambulatory Visit: Payer: Self-pay

## 2018-04-04 DIAGNOSIS — Z9221 Personal history of antineoplastic chemotherapy: Secondary | ICD-10-CM | POA: Diagnosis not present

## 2018-04-04 DIAGNOSIS — C569 Malignant neoplasm of unspecified ovary: Secondary | ICD-10-CM | POA: Diagnosis not present

## 2018-04-04 DIAGNOSIS — N3941 Urge incontinence: Secondary | ICD-10-CM | POA: Insufficient documentation

## 2018-04-04 DIAGNOSIS — Z90710 Acquired absence of both cervix and uterus: Secondary | ICD-10-CM | POA: Diagnosis not present

## 2018-04-04 DIAGNOSIS — Z90722 Acquired absence of ovaries, bilateral: Secondary | ICD-10-CM | POA: Insufficient documentation

## 2018-04-04 DIAGNOSIS — R971 Elevated cancer antigen 125 [CA 125]: Secondary | ICD-10-CM | POA: Diagnosis not present

## 2018-04-05 LAB — CA 125: Cancer Antigen (CA) 125: 781 U/mL — ABNORMAL HIGH (ref 0.0–38.1)

## 2018-04-09 ENCOUNTER — Encounter: Payer: Self-pay | Admitting: Obstetrics and Gynecology

## 2018-04-09 ENCOUNTER — Ambulatory Visit: Payer: Medicare HMO

## 2018-04-09 ENCOUNTER — Other Ambulatory Visit: Payer: Self-pay

## 2018-04-09 ENCOUNTER — Inpatient Hospital Stay (HOSPITAL_BASED_OUTPATIENT_CLINIC_OR_DEPARTMENT_OTHER): Payer: Medicare HMO | Admitting: Obstetrics and Gynecology

## 2018-04-09 VITALS — BP 138/77 | HR 93 | Temp 98.0°F | Resp 18 | Wt 112.2 lb

## 2018-04-09 DIAGNOSIS — N3941 Urge incontinence: Secondary | ICD-10-CM

## 2018-04-09 DIAGNOSIS — C569 Malignant neoplasm of unspecified ovary: Secondary | ICD-10-CM | POA: Diagnosis not present

## 2018-04-09 DIAGNOSIS — Z90722 Acquired absence of ovaries, bilateral: Secondary | ICD-10-CM

## 2018-04-09 DIAGNOSIS — Z90710 Acquired absence of both cervix and uterus: Secondary | ICD-10-CM | POA: Diagnosis not present

## 2018-04-09 DIAGNOSIS — Z9221 Personal history of antineoplastic chemotherapy: Secondary | ICD-10-CM | POA: Diagnosis not present

## 2018-04-09 DIAGNOSIS — R971 Elevated cancer antigen 125 [CA 125]: Secondary | ICD-10-CM | POA: Diagnosis not present

## 2018-04-09 MED ORDER — SULFAMETHOXAZOLE-TRIMETHOPRIM 800-160 MG PO TABS: 1.0000 | ORAL_TABLET | Freq: Two times a day (BID) | ORAL | 0 refills | Status: AC

## 2018-04-09 NOTE — Progress Notes (Signed)
Here for follow up. Per pt overall " feeling pretty good "  Stated she has small "reddened ,raised boil like area on left buttock "  No other voiced c/o

## 2018-04-09 NOTE — Progress Notes (Addendum)
Gynecologic Oncology Consult Visit   Referring Provider: Dr. Rogue Bussing  Chief Concern: ovarian cancer surveillance  Subjective:  Natalie Stone is a 85 y.o. female, initially seen in consultation from Dr. Rogue Bussing for ovarian cancer, who presents to the clinic today for follow-up.  She was last seen by Dr. Fransisca Connors on 02/05/2018.  NED and asymptomatic at that time.  Normal CT scan and normal MRI of brain.  In the interim, CA 125 has been followed:   CA 125  781 (04/04/2018) 865 (02/03/2018)  She denies complaints today and says she feels at baseline.  Does have sore on her bottom she would like to have evaluated.   Oncology History Ovarian cancer- stage III per patient [no records available]. Status post neoadjuvant chemotherapy followed by surgery with Dr Eppie Gibson in [place] in 2013; followed by adjuvant chemotherapy. Clinically no concerns for recurrence at this time.  She said she had negative genetic testing.  Establish care at [facility] in 2017 since she moved back to this area to be near family.  She is grandmother of Dr. Benjaman Kindler.  CA125 3/18 =13.7  02/05/2017- CT C/A/P IMPRESSION: 1. 14 x 13 mm low-density oval mass in the right retrocrural location at the level of the aortic hiatus. This may reflect a mildly enlarged retrocrural lymph node. This could potentially reflect metastatic adenopathy. It could reflect a small lymphangiocele or chronic reactive lymph node. If there are any previous CT exams available for comparison, these could help exclude metastatic disease. If not, follow-up chest CT in 2-3 months to reassess for stability would be recommended. 2. No other findings to suggest metastatic disease. No evidence of locally recurrent ovarian carcinoma. Status post hysterectomy. 3. No acute findings. 4. Coronary artery calcifications.  Aortic atherosclerosis. 5. Mild increased colonic stool burden.  CA125 1/19 = 56.5  05/01/2017- CT C/A/P IMPRESSION: -Status  post hysterectomy and suspected bilateral salpingo-oophorectomy. - No findings specific for recurrent or metastatic disease.  - Benign cystic lesion in the right retrocrural space is likely related to the thoracic duct. This is not considered suspicious for a lymph node. - 4 mm subpleural node in the posterior right middle lobe, new, nonspecific. This appearance is favored to be infectious/inflammatory. However, consider follow-up CT chest in 6 months.  CA125 5/19 = 174.9   06/27/17 CT C/A/P IMPRESSION: 1. There is a small amount of complex fluid within the pelvis with peripheral peritoneal enhancement. Findings are indeterminate but concerning for the possibility of recurrent disease. 2. Additionally within the pelvis there is a small hyperdense nodule which likely represents a hyperdense diverticulum as opposed to a peritoneal nodule as no definite additional peritoneal nodules are identified.  Recommend attention on follow-up.  3. Slight interval decrease in size of right middle lobe nodule, likely resolving infectious/inflammatory process. Recommend attention on follow-up.  Reviewed the May 2019 CT scan with radiology and there is nothing that is amenable to biopsy.   6/19 CA125 = 210.2  Mild SUI.  She had bladder repair many years ago for SUI after having children.  Has been treated with anticholenergics in past, but mouth was very dry and had problems talking so stopped the medication.  Now uses Mirabegron 50 mg qd (a B-agonist) for urge incontinence per Dr Buel Ream suggestion.  No symptoms since she started this medication.   Since 10/18 CA125 has risen progressively from 33 to 865.  She has had multiple negative scans of the chest/abd/pelvis (last 11/19) and brain.  She continues to be asymptomatic.  Last screening colonoscopy 2013.    Component     Latest Ref Rng & Units 12/19/2016 01/22/2017 03/21/2017 06/19/2017  Cancer Antigen (CA) 125     0.0 - 38.1 U/mL 33.3 42.6 (H) 56.5 (H)  174.9 (H)   Component     Latest Ref Rng & Units 08/05/2017 09/30/2017 11/27/2017 02/03/2018  Cancer Antigen (CA) 125     0.0 - 38.1 U/mL 210.2 (H) 297.0 (H) 334.0 (H) 865.0 (H)    In 5/19 the In vitae 83 gene panel was negative for pathogenic mutations. Had one VUS in the MET gene.   CT chest 10/30/17 MPRESSION: 1. No findings suspicious for metastatic disease in the chest. Solitary tiny 2 mm right middle lobe pulmonary nodule is again slightly decreased and probably benign. 2. Coronary atherosclerosis.  CT abd/pelvis 11/19 IMPRESSION: 1. No acute abdominal/pelvic findings. 2. No abdominal/pelvic lymphadenopathy and no worrisome omental or peritoneal lesions are identified. No findings suspicious for recurrent tumor. 3. Advanced atherosclerotic disease involving the thoracic and abdominal aorta and branch vessels.  Problem List: Patient Active Problem List   Diagnosis Date Noted  . Malignant neoplasm of ovary ([place]) 11/23/2015  . Acquired hypothyroidism 10/26/2015  . Aortic atherosclerosis ([place]) 10/26/2015  . Cardiac pacemaker 10/26/2015  . Chemotherapy-induced neuropathy ([place]) 10/26/2015  . Coronary artery disease involving native coronary artery of native heart without angina pectoris 10/26/2015  . Pacemaker pocket hematoma 10/26/2015  . Status post placement of cardiac pacemaker 10/26/2015  . Bradycardia 10/09/2015  . Third degree AV block ([place]) 10/09/2015    Past Medical History: Past Medical History:  Diagnosis Date  . CAD (coronary artery disease)   . Chronic kidney disease    urinary incontinence  . Hyperthyroidism   . Hypothyroidism (acquired)   . Ovarian cancer ([place]) 2014  . Shingles   . Third degree AV block ([place]) 10/09/2015    Past Surgical History: Past Surgical History:  Procedure Laterality Date  . ABDOMINAL HYSTERECTOMY    . BLADDER REPAIR    . CORONARY ANGIOPLASTY WITH STENT PLACEMENT  2001   Dr. Isaiah Blakes in [place]  . PACEMAKER INSERTION N/A  10/11/2015   Procedure: INSERTION PACEMAKER;  Surgeon: [name], MD;  Location: [facility] ORS;  Service: Cardiovascular;  Laterality: N/A;  . PARTIAL HYSTERECTOMY       Family History: Family History  Problem Relation Age of Onset  . Diabetes Brother     Social History: Social History   Socioeconomic History  . Marital status: Married    Spouse name: Not on file  . Number of children: Not on file  . Years of education: Not on file  . Highest education level: Not on file  Occupational History  . Not on file  Social Needs  . Financial resource strain: Not on file  . Food insecurity:    Worry: Not on file    Inability: Not on file  . Transportation needs:    Medical: Not on file    Non-medical: Not on file  Tobacco Use  . Smoking status: Never Smoker  . Smokeless tobacco: Never Used  Substance and Sexual Activity  . Alcohol use: No  . Drug use: No  . Sexual activity: Never  Lifestyle  . Physical activity:    Days per week: Not on file    Minutes per session: Not on file  . Stress: Not on file  Relationships  . Social connections:    Talks on phone: Not on file    Gets together:  Not on file    Attends religious service: Not on file    Active member of club or organization: Not on file    Attends meetings of clubs or organizations: Not on file    Relationship status: Not on file  . Intimate partner violence:    Fear of current or ex partner: Not on file    Emotionally abused: Not on file    Physically abused: Not on file    Forced sexual activity: Not on file  Other Topics Concern  . Not on file  Social History Narrative  . Not on file    Allergies: Allergies  Allergen Reactions  . Penicillins Anaphylaxis    Has patient had a PCN reaction causing immediate rash, facial/tongue/throat swelling, SOB or lightheadedness with hypotension: Yes Has patient had a PCN reaction causing severe rash involving mucus membranes or skin necrosis: No Has patient had a  PCN reaction that required hospitalization No Has patient had a PCN reaction occurring within the last 10 years: No If all of the above answers are "NO", then may proceed with Cephalosporin use.  . Ativan [Lorazepam] Other (See Comments)    Made her not remember for many hours.  . Benadryl [Diphenhydramine] Other (See Comments)    Made her sleep for hours.    Current Medications: Current Outpatient Medications  Medication Sig Dispense Refill  . aspirin EC 81 MG tablet Take 81 mg by mouth daily.    Marland Kitchen atorvastatin (LIPITOR) 20 MG tablet Take 10 mg by mouth at bedtime.    Marland Kitchen Bioflavonoid Products (ESTER C PO) Take 500 mg by mouth daily.     . Cholecalciferol (VITAMIN D3) 1000 units CAPS Take by mouth daily.     Marland Kitchen glucosamine-chondroitin 500-400 MG tablet Take 1 tablet by mouth 2 (two) times daily.     Marland Kitchen levothyroxine (SYNTHROID, LEVOTHROID) 88 MCG tablet Take 100 mcg by mouth at bedtime.     Marland Kitchen b complex vitamins tablet Take 1 tablet by mouth daily.     No current facility-administered medications for this visit.    Review of Systems General:  no complaints Skin: per hpi Eyes: no complaints HEENT: no complaints Breasts: no complaints Pulmonary: no complaints Cardiac: no complaints Gastrointestinal: no complaints Genitourinary/Sexual: no complaints Ob/Gyn: no complaints Musculoskeletal: no complaints Hematology: no complaints Neurologic/Psych: no complaints   Objective:  Physical Examination:  BP 138/77 (BP Location: Left Arm, Patient Position: Sitting)   Pulse 93   Temp 98 F (36.7 C) (Oral)   Resp 18   Wt 112 lb 3.2 oz (50.9 kg)   BMI 21.20 kg/m    ECOG Performance Status: 0 - Asymptomatic  GENERAL: Patient is a well appearing female in no acute distress HEENT:  Sclera clear. Anicteric NODES:  Negative axillary, supraclavicular, inguinal lymph node survery LUNGS:  Clear to auscultation bilaterally.   HEART:  Regular rate and rhythm.  ABDOMEN:  Soft, nontender.  No  hernias, incisions well healed. No masses or ascites EXTREMITIES:  No peripheral edema. Atraumatic. No cyanosis SKIN:  Clear with no obvious rashes or skin changes.  NEURO:  Nonfocal. Well oriented.  Appropriate affect.  Pelvic: exam chaperoned by nurse;  Vulva: normal appearing vulva with no masses, tenderness or lesions; Vagina: normal vagina; Adnexa: surgically absent bilateral; Rectal: normal, no masses.    Assessment:  Maryl Blalock is a 85 y.o. female diagnosed with advanced ovarian cancer treated with neoadjuvant chemo and interval debulking in 2013 in [place].  Germline genetic testing  normal.    NED and asymptomatic but CA125 has been rising despite normal CT scans and MRI of brain.  However, CA125 declined over past two months from 865 to 781.   Urge incontinence s/p surgery for SUI in the past.  Now uses Mirabegron 50 mg qd (a B-agonist) for urge incontinence per Dr Buel Ream suggestion with good results and no symptoms.   Plan:   Problem List Items Addressed This Visit      Endocrine   Malignant neoplasm of ovary ([place]) - Primary     We discussed that CA125 is still elevated, but fell a bit in the past two months.  Will not do another CT presently.  Since she is asymptomatic there is no urgency in diagnosing and treating a recurrence of ovarian cancer, as it will likely still be just as responsive to treatment when detected later.  Do not think it is worth doing a laparoscopy in view of her age and asymptomatic status.   Will plan for RTC in 2 months with CA125.  Can return sooner if new concerning symptoms.    Suspect skin infection/folliculitis. Given duration and worsening symptoms, will send prescription for bactrim ds, 1 tablet bid x 5 days. Use warm, moist compresses 3-4 times a day. Notify if worsening or unresolved symptoms.   The patient's diagnosis, an outline of the further diagnostic and laboratory studies which will be required, the recommendation, and alternatives  were discussed.  All questions were answered to the patient's satisfaction.  Mellody Drown, MD  CC:  Rusty Aus, MD [facility] [place], [place] [address] [phone]

## 2018-04-09 NOTE — Addendum Note (Signed)
Addended by: Verlon Au on: 04/09/2018 02:22 PM   Modules accepted: Orders

## 2018-05-01 DIAGNOSIS — E782 Mixed hyperlipidemia: Secondary | ICD-10-CM | POA: Diagnosis not present

## 2018-05-01 DIAGNOSIS — E039 Hypothyroidism, unspecified: Secondary | ICD-10-CM | POA: Diagnosis not present

## 2018-06-25 ENCOUNTER — Telehealth: Payer: Self-pay

## 2018-06-25 DIAGNOSIS — R634 Abnormal weight loss: Secondary | ICD-10-CM

## 2018-06-25 DIAGNOSIS — R109 Unspecified abdominal pain: Secondary | ICD-10-CM

## 2018-06-25 DIAGNOSIS — Z8543 Personal history of malignant neoplasm of ovary: Secondary | ICD-10-CM

## 2018-06-25 NOTE — Telephone Encounter (Signed)
Received call from daughter, [name]. Ms. Natalie Stone is having abdominal pain. Reports it feels like it did when she was first diagnosed with ovarian cancer. She is experiencing fatigue, nausea and is not eating well. She has lost 10 lbs in the last 6 weeks. Called and spoke with Dr. Fransisca Connors. Received orders for imaging. CT scheduled for 06-30-18 at the [facility]. Instructions given to [facility]. Follow up based on results. She is aware of previously scheduled follow up later this month.

## 2018-06-27 ENCOUNTER — Telehealth: Payer: Self-pay | Admitting: *Deleted

## 2018-06-27 NOTE — Telephone Encounter (Signed)
[facility] patient radiology called. Orders for upcoming ct scan is not yet signed by Dr. Michel Harrow. This needs to be done asap. Also patient needs a serum creatinine drawn and orders need to be placed so that the [facility] ct dept can draw this. Patient is scheduled for Monday 5/11

## 2018-06-27 NOTE — Telephone Encounter (Signed)
Please send orders to me to sign

## 2018-06-30 ENCOUNTER — Ambulatory Visit: Payer: Medicare HMO | Attending: Obstetrics and Gynecology

## 2018-06-30 ENCOUNTER — Telehealth: Payer: Self-pay

## 2018-06-30 DIAGNOSIS — I442 Atrioventricular block, complete: Secondary | ICD-10-CM | POA: Diagnosis not present

## 2018-06-30 DIAGNOSIS — C561 Malignant neoplasm of right ovary: Secondary | ICD-10-CM | POA: Diagnosis not present

## 2018-06-30 DIAGNOSIS — T451X5A Adverse effect of antineoplastic and immunosuppressive drugs, initial encounter: Secondary | ICD-10-CM | POA: Diagnosis not present

## 2018-06-30 DIAGNOSIS — R6889 Other general symptoms and signs: Secondary | ICD-10-CM | POA: Diagnosis not present

## 2018-06-30 DIAGNOSIS — C562 Malignant neoplasm of left ovary: Secondary | ICD-10-CM | POA: Diagnosis not present

## 2018-06-30 DIAGNOSIS — R1031 Right lower quadrant pain: Secondary | ICD-10-CM | POA: Diagnosis not present

## 2018-06-30 DIAGNOSIS — G620 Drug-induced polyneuropathy: Secondary | ICD-10-CM | POA: Diagnosis not present

## 2018-06-30 DIAGNOSIS — R1084 Generalized abdominal pain: Secondary | ICD-10-CM | POA: Diagnosis not present

## 2018-06-30 DIAGNOSIS — I700 Atherosclerosis of aorta: Secondary | ICD-10-CM | POA: Diagnosis not present

## 2018-06-30 NOTE — Telephone Encounter (Addendum)
Voicemail left with daughter, Natalie Stone. It does not appear that Ms. Charlson had her CT scan today. She did pick up prep on 5/8 per appt note update. Asked her to return call for update on her condition. Noted that she called Dr. Sabra Heck this am with same symptoms as Friday, including diarrhea. Will await call from [facility].

## 2018-07-01 DIAGNOSIS — I442 Atrioventricular block, complete: Secondary | ICD-10-CM | POA: Diagnosis not present

## 2018-07-04 ENCOUNTER — Other Ambulatory Visit: Payer: Self-pay

## 2018-07-04 ENCOUNTER — Telehealth: Payer: Self-pay

## 2018-07-04 DIAGNOSIS — Z8543 Personal history of malignant neoplasm of ovary: Secondary | ICD-10-CM

## 2018-07-04 NOTE — Telephone Encounter (Signed)
Called and spoke with Natalie Stone. She is feeling a "little better." Still having extreme fatigue. She would like CT rescheduled. CT and follow up with Dr. Fransisca Connors rescheduled. Went over instructions for CT and order entered for creatinine in case they are unable to perform poct onsite. Left a voicemail with all appt details with daughter, [name]. Instructed to call to she needs to make any changes to appts.

## 2018-07-05 DIAGNOSIS — C561 Malignant neoplasm of right ovary: Secondary | ICD-10-CM | POA: Diagnosis not present

## 2018-07-05 DIAGNOSIS — Z8543 Personal history of malignant neoplasm of ovary: Secondary | ICD-10-CM | POA: Diagnosis not present

## 2018-07-05 DIAGNOSIS — C786 Secondary malignant neoplasm of retroperitoneum and peritoneum: Secondary | ICD-10-CM | POA: Diagnosis not present

## 2018-07-05 DIAGNOSIS — E43 Unspecified severe protein-calorie malnutrition: Secondary | ICD-10-CM | POA: Diagnosis not present

## 2018-07-05 DIAGNOSIS — Z08 Encounter for follow-up examination after completed treatment for malignant neoplasm: Secondary | ICD-10-CM | POA: Diagnosis not present

## 2018-07-05 DIAGNOSIS — I2510 Atherosclerotic heart disease of native coronary artery without angina pectoris: Secondary | ICD-10-CM | POA: Diagnosis not present

## 2018-07-05 DIAGNOSIS — E039 Hypothyroidism, unspecified: Secondary | ICD-10-CM | POA: Diagnosis not present

## 2018-07-05 DIAGNOSIS — R197 Diarrhea, unspecified: Secondary | ICD-10-CM | POA: Diagnosis not present

## 2018-07-05 DIAGNOSIS — R5383 Other fatigue: Secondary | ICD-10-CM | POA: Diagnosis not present

## 2018-07-05 DIAGNOSIS — Z515 Encounter for palliative care: Secondary | ICD-10-CM | POA: Diagnosis not present

## 2018-07-05 DIAGNOSIS — K649 Unspecified hemorrhoids: Secondary | ICD-10-CM | POA: Diagnosis not present

## 2018-07-05 DIAGNOSIS — R110 Nausea: Secondary | ICD-10-CM | POA: Diagnosis not present

## 2018-07-05 DIAGNOSIS — C562 Malignant neoplasm of left ovary: Secondary | ICD-10-CM | POA: Diagnosis not present

## 2018-07-05 DIAGNOSIS — Z9221 Personal history of antineoplastic chemotherapy: Secondary | ICD-10-CM | POA: Diagnosis not present

## 2018-07-05 DIAGNOSIS — R1031 Right lower quadrant pain: Secondary | ICD-10-CM | POA: Diagnosis not present

## 2018-07-05 DIAGNOSIS — R390 Extravasation of urine: Secondary | ICD-10-CM | POA: Diagnosis not present

## 2018-07-05 DIAGNOSIS — N1330 Unspecified hydronephrosis: Secondary | ICD-10-CM | POA: Diagnosis not present

## 2018-07-05 DIAGNOSIS — R634 Abnormal weight loss: Secondary | ICD-10-CM | POA: Diagnosis not present

## 2018-07-05 DIAGNOSIS — N131 Hydronephrosis with ureteral stricture, not elsewhere classified: Secondary | ICD-10-CM | POA: Diagnosis not present

## 2018-07-05 DIAGNOSIS — R140 Abdominal distension (gaseous): Secondary | ICD-10-CM | POA: Diagnosis not present

## 2018-07-05 DIAGNOSIS — R933 Abnormal findings on diagnostic imaging of other parts of digestive tract: Secondary | ICD-10-CM | POA: Diagnosis not present

## 2018-07-05 DIAGNOSIS — R935 Abnormal findings on diagnostic imaging of other abdominal regions, including retroperitoneum: Secondary | ICD-10-CM | POA: Diagnosis not present

## 2018-07-05 DIAGNOSIS — N138 Other obstructive and reflux uropathy: Secondary | ICD-10-CM | POA: Diagnosis not present

## 2018-07-05 DIAGNOSIS — R188 Other ascites: Secondary | ICD-10-CM | POA: Diagnosis not present

## 2018-07-05 DIAGNOSIS — N179 Acute kidney failure, unspecified: Secondary | ICD-10-CM | POA: Diagnosis not present

## 2018-07-05 DIAGNOSIS — Q638 Other specified congenital malformations of kidney: Secondary | ICD-10-CM | POA: Diagnosis not present

## 2018-07-05 DIAGNOSIS — Z681 Body mass index (BMI) 19 or less, adult: Secondary | ICD-10-CM | POA: Diagnosis not present

## 2018-07-05 DIAGNOSIS — N1339 Other hydronephrosis: Secondary | ICD-10-CM | POA: Diagnosis not present

## 2018-07-05 DIAGNOSIS — K6389 Other specified diseases of intestine: Secondary | ICD-10-CM | POA: Diagnosis not present

## 2018-07-05 DIAGNOSIS — K5730 Diverticulosis of large intestine without perforation or abscess without bleeding: Secondary | ICD-10-CM | POA: Diagnosis not present

## 2018-07-05 DIAGNOSIS — Z1159 Encounter for screening for other viral diseases: Secondary | ICD-10-CM | POA: Diagnosis not present

## 2018-07-05 DIAGNOSIS — K222 Esophageal obstruction: Secondary | ICD-10-CM | POA: Diagnosis not present

## 2018-07-05 DIAGNOSIS — N189 Chronic kidney disease, unspecified: Secondary | ICD-10-CM | POA: Diagnosis not present

## 2018-07-05 DIAGNOSIS — K644 Residual hemorrhoidal skin tags: Secondary | ICD-10-CM | POA: Diagnosis not present

## 2018-07-05 DIAGNOSIS — R1032 Left lower quadrant pain: Secondary | ICD-10-CM | POA: Diagnosis not present

## 2018-07-05 DIAGNOSIS — R5381 Other malaise: Secondary | ICD-10-CM | POA: Diagnosis not present

## 2018-07-05 DIAGNOSIS — K921 Melena: Secondary | ICD-10-CM | POA: Diagnosis not present

## 2018-07-06 DIAGNOSIS — R197 Diarrhea, unspecified: Secondary | ICD-10-CM | POA: Insufficient documentation

## 2018-07-06 DIAGNOSIS — N1330 Unspecified hydronephrosis: Secondary | ICD-10-CM | POA: Insufficient documentation

## 2018-07-07 ENCOUNTER — Telehealth: Payer: Self-pay | Admitting: Nurse Practitioner

## 2018-07-07 ENCOUNTER — Telehealth: Payer: Self-pay | Admitting: *Deleted

## 2018-07-07 ENCOUNTER — Inpatient Hospital Stay: Payer: Medicare HMO

## 2018-07-07 MED ORDER — ACETAMINOPHEN 325 MG PO TABS
650.00 | ORAL_TABLET | ORAL | Status: DC
Start: ? — End: 2018-07-07

## 2018-07-07 MED ORDER — LIDOCAINE HCL 1 % IJ SOLN
0.50 | INTRAMUSCULAR | Status: DC
Start: ? — End: 2018-07-07

## 2018-07-07 MED ORDER — CHOLECALCIFEROL 25 MCG (1000 UT) PO TABS
1000.00 | ORAL_TABLET | ORAL | Status: DC
Start: 2018-07-08 — End: 2018-07-07

## 2018-07-07 MED ORDER — ASPIRIN EC 81 MG PO TBEC
81.00 | DELAYED_RELEASE_TABLET | ORAL | Status: DC
Start: 2018-07-08 — End: 2018-07-07

## 2018-07-07 MED ORDER — ATORVASTATIN CALCIUM 10 MG PO TABS
10.00 | ORAL_TABLET | ORAL | Status: DC
Start: 2018-07-08 — End: 2018-07-07

## 2018-07-07 MED ORDER — ONDANSETRON HCL 4 MG/2ML IJ SOLN
4.00 | INTRAMUSCULAR | Status: DC
Start: ? — End: 2018-07-07

## 2018-07-07 NOTE — Telephone Encounter (Signed)
Daughter called reporting that patient was taken to [facility] ER over weekend and is admitted there

## 2018-07-07 NOTE — Telephone Encounter (Signed)
Called patient to follow up. No answer. Left message to call clinic to schedule visit in Symptom Management. Also called her daughter [name], no answer. I'd like for her to be evaluated and will need to get planned imaging performed. I believe she was feeling poorly late last week and scans were rescheduled for 5/22.

## 2018-07-08 DIAGNOSIS — R112 Nausea with vomiting, unspecified: Secondary | ICD-10-CM | POA: Insufficient documentation

## 2018-07-08 DIAGNOSIS — T451X5A Adverse effect of antineoplastic and immunosuppressive drugs, initial encounter: Secondary | ICD-10-CM | POA: Insufficient documentation

## 2018-07-09 ENCOUNTER — Ambulatory Visit: Payer: Medicare HMO

## 2018-07-09 ENCOUNTER — Telehealth: Payer: Self-pay

## 2018-07-09 NOTE — Telephone Encounter (Signed)
Received call from daughter, [name], with update on Natalie Stone. CT scan cancelled as this was performed inpatient at [facility].   Assessment/Plan:  07/08/18 considering high likelihood of ovarian cancer recurrence. Radiology was unable to obtain a biopsy. She does have a flex sig today; should there be evidence of tumor invasion they will biopsy and we can obtain a formal diagnosis. However, given CA-125 trend and imaging findings, we are comfortable initiating therapy. The patient stated that she would like to purse treatment. We discussed single agent carbo versus carbo taxol and reviewed risks/benefits of both options, and she would like Carbo/Taxol. Plan to start with Carbo AUC 4 and Taxol 135 mg/m2. She has a planned left ureteral stent placement tomorrow. Will then plan for chemo on Thursday. Currently holding pharmaceutical anticoagulation given GI bleed/procedures.   07/09/18 Malignant neoplasm of both ovaries  Assessment: Increasing CA125 (1307 on 5/17), GI symptoms, and CT demonstration of carcinomatosis and a soft tissue pelvic mass are concerning for recurrence. Discussed possibility of biopsy with CT body team, but was determined to not be feasible.   Plan:  - Without other likely explanations, pelvic mass is considered most likely consistent with cancer recurrence despite inability to obtain tissue diagnosis. - Plan to start with Carbo AUC 4 and Taxol 135 mg/m2 on 5/21 with transfer to [place] - Follow up scheduled for outpatient treatment in [place]  #Diarrhea, emesis: most likely secondary to carcinomatosis/ intra-abdominal cancer recurrence. Appreciate  work-up performed by GI/general medicine team, which has been unrevealing. -C. Diff, fecal leukocytes, stool O&P negative. Stool culture preliminarily negative. H. Pylori Antibody negative -Flexible sigmoidoscopy and EGD (5/19) noted mild colonic edema, diverticulosis, and Schatski ring but not e/o tumor burden or acute  pathology. -fecal calprotectin in process   Hydronephrosis of left kidney, unspecified (07/06/2018) Assessment: Secondary to extrinsic compression from pelvic soft tissue mass. Urology consulted and following.  Plan: Per Urology team, feel that she would benefit from decompression from stent placement -Urology team plans to proceed with left ureteral stent on 5/20. -NPO since MN for procedure

## 2018-07-09 NOTE — Telephone Encounter (Signed)
error 

## 2018-07-11 ENCOUNTER — Ambulatory Visit: Payer: Medicare HMO

## 2018-07-15 ENCOUNTER — Telehealth: Payer: Self-pay

## 2018-07-15 ENCOUNTER — Ambulatory Visit: Payer: Medicare HMO | Admitting: Oncology

## 2018-07-15 NOTE — Telephone Encounter (Signed)
Spoke with daughter, [name]. Dr. Janese Banks will be able to see Ms. Rittenberry in clinic tomorrow to discuss chemotherapy. Per [name], she is still very weak and she is not sure she can even make appointments tomorrow. She really wants to come and will try her best. She will call me if she is unable to come.

## 2018-07-15 NOTE — Telephone Encounter (Signed)
Voicemail left with Ms. Natalie Stone and daughter, Natalie Stone with appointment details for tomorrow. I have also reached out to Dr. Rogue Bussing to see if he can also see her tomorrow to discuss chemotherapy further.

## 2018-07-16 ENCOUNTER — Inpatient Hospital Stay (HOSPITAL_BASED_OUTPATIENT_CLINIC_OR_DEPARTMENT_OTHER): Payer: Medicare HMO | Admitting: Oncology

## 2018-07-16 ENCOUNTER — Other Ambulatory Visit: Payer: Medicare HMO

## 2018-07-16 ENCOUNTER — Ambulatory Visit: Payer: Medicare HMO

## 2018-07-16 ENCOUNTER — Inpatient Hospital Stay (HOSPITAL_BASED_OUTPATIENT_CLINIC_OR_DEPARTMENT_OTHER): Payer: Medicare HMO | Admitting: Hospice and Palliative Medicine

## 2018-07-16 ENCOUNTER — Encounter: Payer: Self-pay | Admitting: Obstetrics and Gynecology

## 2018-07-16 ENCOUNTER — Inpatient Hospital Stay: Payer: Medicare HMO | Attending: Obstetrics and Gynecology | Admitting: Obstetrics and Gynecology

## 2018-07-16 ENCOUNTER — Other Ambulatory Visit: Payer: Self-pay

## 2018-07-16 VITALS — BP 110/68 | HR 106 | Temp 98.0°F | Resp 18 | Ht 61.0 in | Wt 104.6 lb

## 2018-07-16 DIAGNOSIS — Z79899 Other long term (current) drug therapy: Secondary | ICD-10-CM

## 2018-07-16 DIAGNOSIS — R5383 Other fatigue: Secondary | ICD-10-CM | POA: Diagnosis not present

## 2018-07-16 DIAGNOSIS — Z7189 Other specified counseling: Secondary | ICD-10-CM

## 2018-07-16 DIAGNOSIS — R918 Other nonspecific abnormal finding of lung field: Secondary | ICD-10-CM

## 2018-07-16 DIAGNOSIS — Z90710 Acquired absence of both cervix and uterus: Secondary | ICD-10-CM

## 2018-07-16 DIAGNOSIS — Z515 Encounter for palliative care: Secondary | ICD-10-CM

## 2018-07-16 DIAGNOSIS — Z9221 Personal history of antineoplastic chemotherapy: Secondary | ICD-10-CM | POA: Diagnosis not present

## 2018-07-16 DIAGNOSIS — Z950 Presence of cardiac pacemaker: Secondary | ICD-10-CM

## 2018-07-16 DIAGNOSIS — E039 Hypothyroidism, unspecified: Secondary | ICD-10-CM | POA: Insufficient documentation

## 2018-07-16 DIAGNOSIS — C569 Malignant neoplasm of unspecified ovary: Secondary | ICD-10-CM

## 2018-07-16 DIAGNOSIS — Z7982 Long term (current) use of aspirin: Secondary | ICD-10-CM

## 2018-07-16 DIAGNOSIS — R634 Abnormal weight loss: Secondary | ICD-10-CM | POA: Insufficient documentation

## 2018-07-16 DIAGNOSIS — R630 Anorexia: Secondary | ICD-10-CM

## 2018-07-16 DIAGNOSIS — R197 Diarrhea, unspecified: Secondary | ICD-10-CM | POA: Insufficient documentation

## 2018-07-16 NOTE — Progress Notes (Signed)
Hematology/Oncology Consult note [facility]  [phone] [phone]  Patient Care Team: Rusty Aus, MD as PCP - General (Internal Medicine) Clent Jacks, RN as Registered Nurse   Name of the patient: Natalie [name]  [id number]  [birth year]   Date of visit: 07/16/18  Diagnosis-recurrent ovarian cancer  Chief complaint/ Reason for visit-discuss CT scan and cytology results and further management  Heme/Onc history: Patient is a 85 year old female with a past medical history significant for stage III ovarian cancer that was treated in [place] and 2013.  She underwent neoadjuvant chemotherapy followed by surgery with Dr. Beverly Milch followed by adjuvant chemotherapy.  Genetic testing was negative.  She then established care at [facility] in 2017.  More recently patient was found to have a consistently increasing Ca1 25 level from 42.6 in December 20 18-781 in February 2020.  Most recent Ca1 25 was 02/21/2005 on 07/06/2018.  Scans were however negative for any recurrence.  Patient had CT abdomen and pelvis with contrast done on 07/06/2018 which showed severe left hydro ureteral nephrosis.  Sigmoid wall thickening differential includes carcinomatosis and colitis.  She did had a repeat scan on 07/09/2018 which showed interval increase in ascites mostly within the pelvis with associated enhancement of the peritoneum suggesting carcinomatosis.  Redemonstration of sigmoid wall thickening which may represent colitis versus carcinomatosis.  Delayed left nephrogram with severe left hydro-ureteronephrosis similar to prior.  Given findings of colitis as well as ongoing symptoms of melena and diarrhea patient underwent EGD as well as flexible sigmoidoscopy which showed Schatzki's ring and diverticulosis but no evidence of tumor.  Her symptoms are possibly secondary to carcinomatosis.  She also had other stool studies done including C. difficile and stool cultures which  were negative.Patient did undergo a diagnostic tap of her ascites on 07/09/2018 which was consistent with adenocarcinoma  Interval history- presently patient feels fatigued. She has lost weight about 10-12 pounds in the last 6 months. She has on and off diarrhea. She is drinking protein shakes  ECOG PS- 2 Pain scale- 0 Opioid associated constipation- no  Review of systems- Review of Systems  Constitutional: Positive for malaise/fatigue and weight loss.  Gastrointestinal: Positive for diarrhea.      Allergies  Allergen Reactions  . Penicillins Anaphylaxis    Has patient had a PCN reaction causing immediate rash, facial/tongue/throat swelling, SOB or lightheadedness with hypotension: Yes Has patient had a PCN reaction causing severe rash involving mucus membranes or skin necrosis: No Has patient had a PCN reaction that required hospitalization No Has patient had a PCN reaction occurring within the last 10 years: No If all of the above answers are "NO", then may proceed with Cephalosporin use.  . Ativan [Lorazepam] Other (See Comments)    Made her not remember for many hours.  . Benadryl [Diphenhydramine] Other (See Comments)    Made her sleep for hours.     Past Medical History:  Diagnosis Date  . CAD (coronary artery disease)   . Chronic kidney disease    urinary incontinence  . Hyperthyroidism   . Hypothyroidism (acquired)   . Ovarian cancer ([place]) 2014  . Shingles   . Third degree AV block ([place]) 10/09/2015     Past Surgical History:  Procedure Laterality Date  . ABDOMINAL HYSTERECTOMY    . BLADDER REPAIR    . CORONARY ANGIOPLASTY WITH STENT PLACEMENT  2001   Dr. Isaiah Blakes in [place]  . CYSTOURETHROSCOPY, WITH INSERTION OF INDWELLING URETERAL STENT (  EG,GIBBONS OR DOUBLE-J TYPE) (Left Ureter Left   . PACEMAKER INSERTION N/A 10/11/2015   Procedure: INSERTION PACEMAKER;  Surgeon: [name], MD;  Location: [facility] ORS;  Service: Cardiovascular;  Laterality: N/A;  .  PARTIAL HYSTERECTOMY      Social History   Socioeconomic History  . Marital status: Widowed    Spouse name: Not on file  . Number of children: Not on file  . Years of education: Not on file  . Highest education level: Not on file  Occupational History  . Not on file  Social Needs  . Financial resource strain: Not on file  . Food insecurity:    Worry: Not on file    Inability: Not on file  . Transportation needs:    Medical: Not on file    Non-medical: Not on file  Tobacco Use  . Smoking status: Never Smoker  . Smokeless tobacco: Never Used  Substance and Sexual Activity  . Alcohol use: No  . Drug use: No  . Sexual activity: Never  Lifestyle  . Physical activity:    Days per week: Not on file    Minutes per session: Not on file  . Stress: Not on file  Relationships  . Social connections:    Talks on phone: Not on file    Gets together: Not on file    Attends religious service: Not on file    Active member of club or organization: Not on file    Attends meetings of clubs or organizations: Not on file    Relationship status: Not on file  . Intimate partner violence:    Fear of current or ex partner: Not on file    Emotionally abused: Not on file    Physically abused: Not on file    Forced sexual activity: Not on file  Other Topics Concern  . Not on file  Social History Narrative  . Not on file    Family History  Problem Relation Age of Onset  . Diabetes Brother      Current Outpatient Medications:  .  aspirin EC 81 MG tablet, Take 81 mg by mouth daily., Disp: , Rfl:  .  atorvastatin (LIPITOR) 20 MG tablet, Take 10 mg by mouth at bedtime., Disp: , Rfl:  .  b complex vitamins tablet, Take 1 tablet by mouth daily., Disp: , Rfl:  .  Bioflavonoid Products (ESTER C PO), Take 500 mg by mouth daily. , Disp: , Rfl:  .  Cholecalciferol (VITAMIN D3) 1000 units CAPS, Take by mouth daily. , Disp: , Rfl:  .  Glucosamine-MSM-Hyaluronic Acd (JOINT HEALTH PO), Take 1  tablet by mouth daily., Disp: , Rfl:  .  levothyroxine (SYNTHROID, LEVOTHROID) 88 MCG tablet, Take 100 mcg by mouth at bedtime. , Disp: , Rfl:   Physical exam: There were no vitals filed for this visit. Physical Exam Constitutional:      Comments: Thin frail elderly woman sitting in a wheelchair.  Appears in no acute distress  HENT:     Head: Normocephalic and atraumatic.  Eyes:     Pupils: Pupils are equal, round, and reactive to light.  Neck:     Musculoskeletal: Normal range of motion.  Cardiovascular:     Rate and Rhythm: Normal rate and regular rhythm.     Heart sounds: Normal heart sounds.  Pulmonary:     Effort: Pulmonary effort is normal.     Breath sounds: Normal breath sounds.  Abdominal:     General: Bowel  sounds are normal.     Palpations: Abdomen is soft.  Skin:    General: Skin is warm and dry.  Neurological:     Mental Status: She is alert and oriented to person, place, and time.      CMP Latest Ref Rng & Units 12/23/2017  Glucose 65 - 99 mg/dL -  BUN 6 - 20 mg/dL -  Creatinine 0.44 - 1.00 mg/dL 0.70  Sodium 135 - 145 mmol/L -  Potassium 3.5 - 5.1 mmol/L -  Chloride 101 - 111 mmol/L -  CO2 22 - 32 mmol/L -  Calcium 8.9 - 10.3 mg/dL -  Total Protein 6.5 - 8.1 g/dL -  Total Bilirubin 0.3 - 1.2 mg/dL -  Alkaline Phos 38 - 126 U/L -  AST 15 - 41 U/L -  ALT 14 - 54 U/L -   CBC Latest Ref Rng & Units 10/13/2015  WBC 3.6 - 11.0 K/uL 9.6  Hemoglobin 12.0 - 16.0 g/dL 14.9  Hematocrit 35.0 - 47.0 % 43.5  Platelets 150 - 440 K/uL 164       No results found.   Assessment and plan- Patient is a 85 y.o. female with recurrent ovarian carcinoma which previously showed signs of biochemical recurrence now rate recurrent peritoneal disease with cytology positive for adenocarcinoma  Patient was seen by Dr. Fransisca Connors prior to my visit today.  We discussed restarting carbotaxol chemotherapy at this time.  Patient presently is frailer as compared to what she was in  2013.  We discussed doing weekly carbotaxol instead of every 3 weeks.  Dr. Fransisca Connors recommended starting carboplatin alone for the first cycle and if she tolerates it well proceed with carbotaxol with cycle 2.  She also has poor vascular access and would need a port placement.  Differential symmetrical hearing Admitted nausea, vomiting, low blood counts, risk of infections and hospitalization.  Risk of infusion reaction and peripheral neuropathy associated with Taxol.  Treatment will be given with a palliative intent.  Patient understands and agrees to proceed as planned.  We will plan for port placement and she will tentatively see Dr. Rogue Bussing in 1 week's time to start her first cycle of carboplatin and AUC 2.  We will check CBC, CMP and Ca1 25 at that time as well.  Patient will be meeting Altha Harm NP from palliative care to discuss her ongoing symptoms.  She would also like to be referred to dietician which we will do today  I have communicated this plan to the patient's daughter [name], patient's granddaughter Dr. Leafy Ro as well as Dr. Rogue Bussing.   Total face to face encounter time for this patient visit was 40 min. >50% of the time was  spent in counseling and coordination of care.     Visit Diagnosis 1. Goals of care, counseling/discussion   2. Malignant neoplasm of ovary, unspecified laterality ([place])      Dr. Randa Evens, MD, MPH [facility] [phone] 07/16/2018 1:25 PM

## 2018-07-16 NOTE — Progress Notes (Signed)
Patient on plan of care prior to pathways. 

## 2018-07-16 NOTE — Progress Notes (Signed)
START ON PATHWAY REGIMEN - Ovarian     A cycle is every 21 days:     Paclitaxel      Carboplatin   **Always confirm dose/schedule in your pharmacy ordering system**  Administration Notes: weekly regimen preferred over Q3 weeks  Patient Characteristics: Recurrent or Progressive Disease, Second Line Therapy, Platinum Sensitive and ? 6 Months Since Last Therapy Therapeutic Status: Recurrent or Progressive Disease BRCA Mutation Status: Quantity Not Sufficient Line of Therapy: Second Line  Intent of Therapy: Non-Curative / Palliative Intent, Discussed with Patient

## 2018-07-16 NOTE — Progress Notes (Signed)
Gynecologic Oncology Interval Visit   Referring Provider: Dr. Rogue Bussing  Chief Concern: recurrent ovarian cancer   Subjective:  Natalie [name] is a 85 y.o. [name], initially seen in consultation from Dr. Rogue Bussing for ovarian cancer, who presents to the clinic today for treatment discussion.  Last week she presented to [facility] ER with complaints of abdominal pain, fatigue, nausea, poor appetite, and weight loss. She was found to have melena and admitted for concerns of recurrence of ovarian cancer. CA 125 was 1307 and CT demonstrated carcinomatosis and soft tissue pelvic mass, and hydronephrosis of left kidney. Biopsy was discussed but felt not to be feasible. Paracentesis was performed on 5/20 and cytology consistent with adenocarcinoma. She underwent left ureteral stent placement on 5/20. Given melena and diarrhea she also underwent flex sig and EGD which noted mild colonic edema, diverticulosis, and Schatski ring but no evidence of tumor burden or acute pathology. C diff, fecal leukocytes, stool O&P, stool culture, fecal calprotectin, and h pylori were negative. Ultimately melena and diarrhea felt to be secondary to carcinomatosis. After extensive discussion of goals of care with patient and family, she elected to reinitiate chemotherapy. She has suffered poor oral intake, nausea, and diarrhea.   She continues to have diarrhea, fatigue, poor oral intake.   CA 125 has been followed:  1307.6  07/06/2018 781  04/04/2018 865  02/03/2018  Oncology History Ovarian cancer- stage III per patient [no records available]. Status post neoadjuvant chemotherapy followed by surgery with Dr Eppie Gibson in [place] in 2013; followed by adjuvant chemotherapy. Clinically no concerns for recurrence at this time.  She said she had negative genetic testing.  Establish care at [facility] in 2017 since she moved back to this area to be near family.  She is grandmother of Dr. Benjaman Kindler.  CA125 3/18 =13.7  02/05/2017-  CT C/A/P IMPRESSION: 1. 14 x 13 mm low-density oval mass in the right retrocrural location at the level of the aortic hiatus. This may reflect a mildly enlarged retrocrural lymph node. This could potentially reflect metastatic adenopathy. It could reflect a small lymphangiocele or chronic reactive lymph node. If there are any previous CT exams available for comparison, these could help exclude metastatic disease. If not, follow-up chest CT in 2-3 months to reassess for stability would be recommended. 2. No other findings to suggest metastatic disease. No evidence of locally recurrent ovarian carcinoma. Status post hysterectomy. 3. No acute findings. 4. Coronary artery calcifications.  Aortic atherosclerosis. 5. Mild increased colonic stool burden.  CA125 1/19 = 56.5  05/01/2017- CT C/A/P IMPRESSION: -Status post hysterectomy and suspected bilateral salpingo-oophorectomy. - No findings specific for recurrent or metastatic disease.  - Benign cystic lesion in the right retrocrural space is likely related to the thoracic duct. This is not considered suspicious for a lymph node. - 4 mm subpleural node in the posterior right middle lobe, new, nonspecific. This appearance is favored to be infectious/inflammatory. However, consider follow-up CT chest in 6 months.  CA125 5/19 = 174.9   06/27/17 CT C/A/P IMPRESSION: 1. There is a small amount of complex fluid within the pelvis with peripheral peritoneal enhancement. Findings are indeterminate but concerning for the possibility of recurrent disease. 2. Additionally within the pelvis there is a small hyperdense nodule which likely represents a hyperdense diverticulum as opposed to a peritoneal nodule as no definite additional peritoneal nodules are identified.  Recommend attention on follow-up.  3. Slight interval decrease in size of right middle lobe nodule, likely resolving infectious/inflammatory process. Recommend attention  on follow-up.  Reviewed the  May 2019 CT scan with radiology and there is nothing that is amenable to biopsy.   6/19 CA-125 = 210.2  Mild SUI.  She had bladder repair many years ago for SUI after having children.  Has been treated with anticholenergics in past, but mouth was very dry and had problems talking so stopped the medication.  Now uses Mirabegron 50 mg qd (a B-agonist) for urge incontinence per Dr Buel Ream suggestion.  No symptoms since she started this medication.   Since 10/18 CA125 has risen progressively from 33 to 865.  She has had multiple negative scans of the chest/abd/pelvis (last 11/19) and brain.  She continues to be asymptomatic.   Last screening colonoscopy 2013.    Component     Latest Ref Rng & Units 12/19/2016 01/22/2017 03/21/2017 06/19/2017  Cancer Antigen (CA) 125     0.0 - 38.1 U/mL 33.3 42.6 (H) 56.5 (H) 174.9 (H)   Component     Latest Ref Rng & Units 08/05/2017 09/30/2017 11/27/2017 02/03/2018  Cancer Antigen (CA) 125     0.0 - 38.1 U/mL 210.2 (H) 297.0 (H) 334.0 (H) 865.0 (H)    In 5/19 the In vitae 83 gene panel was negative for pathogenic mutations. Had one VUS in the MET gene.   CT chest 10/30/17 1. No findings suspicious for metastatic disease in the chest. Solitary tiny 2 mm right middle lobe pulmonary nodule is again slightly decreased and probably benign. 2. Coronary atherosclerosis.  CT abd/pelvis 11/19 1. No acute abdominal/pelvic findings. 2. No abdominal/pelvic lymphadenopathy and no worrisome omental or peritoneal lesions are identified. No findings suspicious for recurrent tumor. 3. Advanced atherosclerotic disease involving the thoracic and abdominal aorta and branch vessels.  Seen by Dr. Fransisca Connors on 02/05/2018.  NED and asymptomatic at that time.  Normal CT scan and normal MRI of brain.  Problem List: Patient Active Problem List   Diagnosis Date Noted  . Malignant neoplasm of ovary ([place]) 11/23/2015  . Acquired hypothyroidism 10/26/2015  . Aortic atherosclerosis  ([place]) 10/26/2015  . Cardiac pacemaker 10/26/2015  . Chemotherapy-induced neuropathy ([place]) 10/26/2015  . Coronary artery disease involving native coronary artery of native heart without angina pectoris 10/26/2015  . Pacemaker pocket hematoma 10/26/2015  . Status post placement of cardiac pacemaker 10/26/2015  . Bradycardia 10/09/2015  . Third degree AV block ([place]) 10/09/2015    Past Medical History: Past Medical History:  Diagnosis Date  . CAD (coronary artery disease)   . Chronic kidney disease    urinary incontinence  . Hyperthyroidism   . Hypothyroidism (acquired)   . Ovarian cancer ([place]) 2014  . Shingles   . Third degree AV block ([place]) 10/09/2015    Past Surgical History: Past Surgical History:  Procedure Laterality Date  . ABDOMINAL HYSTERECTOMY    . BLADDER REPAIR    . CORONARY ANGIOPLASTY WITH STENT PLACEMENT  2001   Dr. Isaiah Blakes in [place]  . PACEMAKER INSERTION N/A 10/11/2015   Procedure: INSERTION PACEMAKER;  Surgeon: [name], MD;  Location: [facility] ORS;  Service: Cardiovascular;  Laterality: N/A;  . PARTIAL HYSTERECTOMY       Family History: Family History  Problem Relation Age of Onset  . Diabetes Brother     Social History: Social History   Socioeconomic History  . Marital status: Widowed    Spouse name: Not on file  . Number of children: Not on file  . Years of education: Not on file  . Highest education level: Not  on file  Occupational History  . Not on file  Social Needs  . Financial resource strain: Not on file  . Food insecurity:    Worry: Not on file    Inability: Not on file  . Transportation needs:    Medical: Not on file    Non-medical: Not on file  Tobacco Use  . Smoking status: Never Smoker  . Smokeless tobacco: Never Used  Substance and Sexual Activity  . Alcohol use: No  . Drug use: No  . Sexual activity: Never  Lifestyle  . Physical activity:    Days per week: Not on file    Minutes per session: Not on file  .  Stress: Not on file  Relationships  . Social connections:    Talks on phone: Not on file    Gets together: Not on file    Attends religious service: Not on file    Active member of club or organization: Not on file    Attends meetings of clubs or organizations: Not on file    Relationship status: Not on file  . Intimate partner violence:    Fear of current or ex partner: Not on file    Emotionally abused: Not on file    Physically abused: Not on file    Forced sexual activity: Not on file  Other Topics Concern  . Not on file  Social History Narrative  . Not on file    Allergies: Allergies  Allergen Reactions  . Penicillins Anaphylaxis    Has patient had a PCN reaction causing immediate rash, facial/tongue/throat swelling, SOB or lightheadedness with hypotension: Yes Has patient had a PCN reaction causing severe rash involving mucus membranes or skin necrosis: No Has patient had a PCN reaction that required hospitalization No Has patient had a PCN reaction occurring within the last 10 years: No If all of the above answers are "NO", then may proceed with Cephalosporin use.  . Ativan [Lorazepam] Other (See Comments)    Made her not remember for many hours.  . Benadryl [Diphenhydramine] Other (See Comments)    Made her sleep for hours.    Current Medications: Current Outpatient Medications  Medication Sig Dispense Refill  . aspirin EC 81 MG tablet Take 81 mg by mouth daily.    Marland Kitchen atorvastatin (LIPITOR) 20 MG tablet Take 10 mg by mouth at bedtime.    Marland Kitchen b complex vitamins tablet Take 1 tablet by mouth daily.    Marland Kitchen Bioflavonoid Products (ESTER C PO) Take 500 mg by mouth daily.     . Cholecalciferol (VITAMIN D3) 1000 units CAPS Take by mouth daily.     Marland Kitchen glucosamine-chondroitin 500-400 MG tablet Take 1 tablet by mouth 2 (two) times daily.     Marland Kitchen levothyroxine (SYNTHROID, LEVOTHROID) 88 MCG tablet Take 100 mcg by mouth at bedtime.      No current facility-administered medications  for this visit.    Review of Systems General:  weak Skin: no complaints Eyes: no complaints HEENT: no complaints Breasts: no complaints Pulmonary: no complaints Cardiac: no complaints Gastrointestinal: see HPI Genitourinary/Sexual: no complaints Ob/Gyn: no complaints Musculoskeletal: no complaints Hematology: no complaints Neurologic/Psych: no complaints  Objective:  Physical Examination:  BP 110/68 (BP Location: Left Arm, Patient Position: Sitting)   Pulse (!) 106   Temp 98 F (36.7 C) (Tympanic)   Resp 18   Ht _0  (1.549 m)   Wt 104 lb 9.6 oz (47.4 kg)   BMI 19.76 kg/m  ECOG Performance Status: 2 - Symptomatic, <50% confined to bed  GENERAL: Thin and weak in appearance in no acute distress HEENT:  Sclera clear. Anicteric NODES:  Negative axillary, supraclavicular, inguinal lymph node survery LUNGS:  Clear to auscultation bilaterally.   HEART:  Regular rate and rhythm.  ABDOMEN:  Soft, nontender.  No hernias, incisions well healed. No masses or ascites EXTREMITIES:  No peripheral edema. Atraumatic. No cyanosis SKIN:  Clear with no obvious rashes or skin changes.  NEURO:  Nonfocal. Well oriented.  Appropriate affect.  Pelvic: deferred    Assessment:  Faren Florence is a 85 y.o. [name] diagnosed with advanced ovarian cancer treated with neoadjuvant chemo and interval debulking in 2013 in [place].  Germline genetic testing normal.  Now with recurrent peritoneal disease documented after CA125 had been progressively rising for over a year with normal CT scans.  Performance status is only fair and she has significant GI symptoms including diarrhea and nausea and has lost weight.  Urge incontinence s/p surgery for SUI in the past.  Now uses Mirabegron 50 mg qd (a B-agonist) for urge incontinence per Dr Buel Ream suggestion with good results and no symptoms.   Plan:   Problem List Items Addressed This Visit    None    Visit Diagnoses    Ovarian cancer, unspecified  laterality [facility])    -  Primary     Discussed with Dr Janese Banks in Medical Oncology today.  Since she has not had treatment in 7 years there is a high chance that the ovarian cancer will be sensitive to platin-based chemotherapy.  In view of her impaired performance status would start with gentle chemotherapy regimen with carboplatin AUC 2 weekly for three weeks and then a week break.  When her PS improves would add weekly taxol. Also discussed with patient and daughter the option of not doing additional treatment at this point in view of her age and PS, but she is in agreement with proceeding with treatment now.  We also discussed the need for ongoing symptom management with the palliative care team and IV port placement for access.         Will plan for RTC in 3 months with CA125.    The patient's diagnosis, an outline of the further diagnostic and laboratory studies which will be required, the recommendation, and alternatives were discussed.  All questions were answered to the patient's satisfaction.  Mellody Drown, MD  CC:  Rusty Aus, MD [facility] [place], [place] [address] [phone]

## 2018-07-16 NOTE — Progress Notes (Signed)
[place]  Telephone:([phone] [phone]   Name: Natalie [name] Date: 07/16/2018 MRN: [id number]  DOB: Nov 28, 1926  Patient Care Team: Rusty Aus, MD as PCP - General (Internal Medicine) Clent Jacks, RN as Registered Nurse    REASON FOR CONSULTATION: Palliative Care consult requested for this 85 y.o. female with multiple medical problems including recurrent ovarian cancer status post neoadjuvant chemotherapy and surgery in 2013.  Patient has been followed by Gyn Onc.  She was hospitalized at [facility] 07/05/2018-07/13/2018 with abdominal pain and melena and was found to have hydronephrosis of the left kidney and increasing CA-125 with CT demonstrating carcinomatosis and a pelvic mass concerning for recurrence.  Patient is status post left ureteral stent on 5/20.  She has been referred back to outpatient medical oncology/GYN oncology.  Patient is also been referred to palliative care for support.  SOCIAL HISTORY:     reports that she has never smoked. She has never used smokeless tobacco. She reports that she does not drink alcohol or use drugs.  Patient is recently widowed having lost her husband in March 2020.  She lives at home with her daughter.  She also has a granddaughter who lives nearby and is involved in her care.  Her granddaughter is an OB/GYN.    Patient formally worked as a Pharmacist, hospital.  She lived in the [place] area for over 40 years and moved here several years ago to be near family.  Patient lives in the independent living section of [facility].  ADVANCE DIRECTIVES:  Not on file  CODE STATUS:   PAST MEDICAL HISTORY: Past Medical History:  Diagnosis Date  . CAD (coronary artery disease)   . Chronic kidney disease    urinary incontinence  . Hyperthyroidism   . Hypothyroidism (acquired)   . Ovarian cancer ([place]) 2014  . Shingles   . Third degree AV block ([place]) 10/09/2015    PAST SURGICAL HISTORY:  Past  Surgical History:  Procedure Laterality Date  . ABDOMINAL HYSTERECTOMY    . BLADDER REPAIR    . CORONARY ANGIOPLASTY WITH STENT PLACEMENT  2001   Dr. Isaiah Blakes in [place]  . CYSTOURETHROSCOPY, WITH INSERTION OF INDWELLING URETERAL STENT (EG,GIBBONS OR DOUBLE-J TYPE) (Left Ureter Left   . PACEMAKER INSERTION N/A 10/11/2015   Procedure: INSERTION PACEMAKER;  Surgeon: [name], MD;  Location: [facility] ORS;  Service: Cardiovascular;  Laterality: N/A;  . PARTIAL HYSTERECTOMY      HEMATOLOGY/ONCOLOGY HISTORY:  Oncology History   # NOV 2013-RIGHT OVARIAN CANCER-HIGH GRADE SEROUS CA 125-996 STAGE III [[facility]Jan 2014- neo-adj chemo x3 M;April 2014 TAH & BSO ; chemo x 62M [finished C-6; July 2014]. Ca 125 -30; N  # GENETIC TESTING-"OncoGene Dx"-NEG  S/p pacemaker [Dr.Parascholes]     Malignant neoplasm of ovary ([place])   11/23/2015 Initial Diagnosis    Ovarian cancer, unspecified laterality (HCC)     ALLERGIES:  is allergic to penicillins; ativan [lorazepam]; and benadryl [diphenhydramine].  MEDICATIONS:  Current Outpatient Medications  Medication Sig Dispense Refill  . aspirin EC 81 MG tablet Take 81 mg by mouth daily.    Marland Kitchen atorvastatin (LIPITOR) 20 MG tablet Take 10 mg by mouth at bedtime.    Marland Kitchen b complex vitamins tablet Take 1 tablet by mouth daily.    Marland Kitchen Bioflavonoid Products (ESTER C PO) Take 500 mg by mouth daily.     . Cholecalciferol (VITAMIN D3) 1000 units CAPS Take by mouth daily.     Marland Kitchen  levothyroxine (SYNTHROID, LEVOTHROID) 88 MCG tablet Take 100 mcg by mouth at bedtime.      No current facility-administered medications for this visit.     VITAL SIGNS: There were no vitals taken for this visit. There were no vitals filed for this visit.  Estimated body mass index is 19.76 kg/m as calculated from the following:   Height as of an earlier encounter on 07/16/18: 5' 1"  (1.549 m).   Weight as of an earlier encounter on 07/16/18: 104 lb 9.6 oz  (47.4 kg).  LABS: CBC:    Component Value Date/Time   WBC 9.6 10/13/2015 0515   HGB 14.9 10/13/2015 0515   HCT 43.5 10/13/2015 0515   PLT 164 10/13/2015 0515   MCV 92.9 10/13/2015 0515   Comprehensive Metabolic Panel:    Component Value Date/Time   NA 140 10/13/2015 0515   K 4.0 10/13/2015 0515   CL 105 10/13/2015 0515   CO2 32 10/13/2015 0515   BUN 17 10/13/2015 0515   CREATININE 0.70 12/23/2017 1053   GLUCOSE 108 (H) 10/13/2015 0515   CALCIUM 9.0 10/13/2015 0515   AST 41 10/09/2015 1935   ALT 51 10/09/2015 1935   ALKPHOS 68 10/09/2015 1935   BILITOT 0.8 10/09/2015 1935   PROT 7.1 10/09/2015 1935   ALBUMIN 3.7 10/09/2015 1935    RADIOGRAPHIC STUDIES: No results found.  PERFORMANCE STATUS (ECOG) : 1 - Symptomatic but completely ambulatory  Review of Systems Unless otherwise noted, a complete review of systems is negative.  Physical Exam General: NAD, frail appearing, thin Cardiovascular: regular rate and rhythm Pulmonary: clear ant fields Abdomen: soft, nontender, + bowel sounds GU: no suprapubic tenderness Extremities: no edema, no joint deformities Skin: no rashes Neurological: Weakness but otherwise nonfocal  IMPRESSION: I met with patient today following her appointments with Dr. Fransisca Connors and Dr. Janese Banks.  I introduced palliative care services and attempted to establish therapeutic rapport.  Patient's daughter and granddaughter participated in the visit via conference call.  Symptomatically, patient describes slow improvement in diarrhea and pain following her recent discharge from hospital two days ago.    Patient is most distressed by persistent weight loss.  I note that patient weighed 112 pounds in February and is now down to 104 pounds.  This reflects a 7.14% decrease in total body weight in 3 months. BMI is 19.  We discussed strategies for increasing protein and calories.  Will refer her to [place], [place].  Patient has some underlying weakness following her  recent hospitalization.  At baseline, she is ambulatory with use of a walker.  She is able to bathe and dress herself but daughter assists with household chores and cooking and cleaning.  Will refer to home health PT/OT for weakness.  Note that patient recently lost her husband 7 weeks ago following a prolonged course of dementia leading to a fall and hip fracture.  Patient demonstrating appropriate signs of grieving.  She is being followed by the bereavement team with hospice.  Patient denies depressive symptoms.  PLAN: -Continue current scope of treatment.  Patient is interested in initiation of chemotherapy -Referral to home health PT/OT for weakness -Referral to RD -Increase oral supplements to twice daily -Increase fluid intake -Will continue to meet with patient to discuss goals and provide support.  Will need to clarify ACP/CODE STATUS.   Patient expressed understanding and was in agreement with this plan. She also understands that She can call the clinic at any time with any questions, concerns, or complaints.  Time Total: 60 minutes  Visit consisted of counseling and education dealing with the complex and emotionally intense issues of symptom management and palliative care in the setting of serious and potentially life-threatening illness.Greater than 50%  of this time was spent counseling and coordinating care related to the above assessment and plan.  Signed by: Altha Harm, PhD, NP-C [phone] (Work Cell)

## 2018-07-17 DIAGNOSIS — C569 Malignant neoplasm of unspecified ovary: Secondary | ICD-10-CM

## 2018-07-17 DIAGNOSIS — R634 Abnormal weight loss: Secondary | ICD-10-CM

## 2018-07-17 NOTE — Progress Notes (Signed)
Referral placed to [facility], PT, OT, home health and dietician for J. Borders DNP.

## 2018-07-18 ENCOUNTER — Telehealth: Payer: Self-pay | Admitting: *Deleted

## 2018-07-18 ENCOUNTER — Telehealth (INDEPENDENT_AMBULATORY_CARE_PROVIDER_SITE_OTHER): Payer: Self-pay

## 2018-07-18 ENCOUNTER — Other Ambulatory Visit: Payer: Self-pay | Admitting: Oncology

## 2018-07-18 NOTE — Telephone Encounter (Signed)
Natalie Stone and gave her date for chemo and it is 6/8 and it will be 9:15 then see Dr. B and then get infusion. She will be here for approximately 3 hours. She has the dates

## 2018-07-18 NOTE — Telephone Encounter (Signed)
Spoke with the patient's daughter and the patient is scheduled to have her procedure on Thursday 07/24/2018 with Dr. Lucky Cowboy with a 11:00 am arrival time. Patient will do her Covid testing on Monday 07/21/2018. All pre-procedure instructions were gone over as well as the zero visitor policy.

## 2018-07-20 ENCOUNTER — Other Ambulatory Visit (INDEPENDENT_AMBULATORY_CARE_PROVIDER_SITE_OTHER): Payer: Self-pay | Admitting: Nurse Practitioner

## 2018-07-20 DIAGNOSIS — Z90710 Acquired absence of both cervix and uterus: Secondary | ICD-10-CM | POA: Diagnosis not present

## 2018-07-20 DIAGNOSIS — Z79899 Other long term (current) drug therapy: Secondary | ICD-10-CM | POA: Diagnosis not present

## 2018-07-20 DIAGNOSIS — R530 Neoplastic (malignant) related fatigue: Secondary | ICD-10-CM | POA: Diagnosis not present

## 2018-07-20 DIAGNOSIS — Z7982 Long term (current) use of aspirin: Secondary | ICD-10-CM | POA: Diagnosis not present

## 2018-07-20 DIAGNOSIS — Z950 Presence of cardiac pacemaker: Secondary | ICD-10-CM | POA: Diagnosis not present

## 2018-07-20 DIAGNOSIS — N1330 Unspecified hydronephrosis: Secondary | ICD-10-CM | POA: Diagnosis not present

## 2018-07-20 DIAGNOSIS — C561 Malignant neoplasm of right ovary: Secondary | ICD-10-CM | POA: Diagnosis not present

## 2018-07-21 ENCOUNTER — Other Ambulatory Visit: Payer: Self-pay

## 2018-07-21 ENCOUNTER — Other Ambulatory Visit
Admission: RE | Admit: 2018-07-21 | Discharge: 2018-07-21 | Disposition: A | Discharge status: Home / Self Care | Payer: Medicare HMO | Source: Ambulatory Visit | Attending: Vascular Surgery | Admitting: Vascular Surgery

## 2018-07-21 ENCOUNTER — Encounter: Payer: Self-pay | Admitting: Oncology

## 2018-07-21 DIAGNOSIS — Z7189 Other specified counseling: Secondary | ICD-10-CM | POA: Insufficient documentation

## 2018-07-21 DIAGNOSIS — Z1159 Encounter for screening for other viral diseases: Secondary | ICD-10-CM | POA: Insufficient documentation

## 2018-07-22 ENCOUNTER — Inpatient Hospital Stay: Payer: Medicare HMO | Admitting: Internal Medicine

## 2018-07-22 ENCOUNTER — Inpatient Hospital Stay: Payer: Medicare HMO

## 2018-07-22 LAB — NOVEL CORONAVIRUS, NAA (HOSP ORDER, SEND-OUT TO REF LAB; TAT 18-24 HRS): SARS-CoV-2, NAA: NOT DETECTED

## 2018-07-23 MED ORDER — CLINDAMYCIN PHOSPHATE 300 MG/50ML IV SOLN: 300.0000 mg | Freq: Once | INTRAVENOUS | Status: DC

## 2018-07-24 ENCOUNTER — Other Ambulatory Visit: Payer: Self-pay

## 2018-07-24 ENCOUNTER — Encounter
Admission: RE | Disposition: A | Discharge status: Home / Self Care | Payer: Self-pay | Source: Home / Self Care | Attending: Vascular Surgery

## 2018-07-24 ENCOUNTER — Ambulatory Visit
Admission: RE | Admit: 2018-07-24 | Discharge: 2018-07-24 | Disposition: A | Discharge status: Home / Self Care | Payer: Medicare HMO | Attending: Vascular Surgery | Admitting: Vascular Surgery

## 2018-07-24 ENCOUNTER — Encounter: Payer: Self-pay | Admitting: *Deleted

## 2018-07-24 DIAGNOSIS — I2510 Atherosclerotic heart disease of native coronary artery without angina pectoris: Secondary | ICD-10-CM | POA: Diagnosis not present

## 2018-07-24 DIAGNOSIS — Z90710 Acquired absence of both cervix and uterus: Secondary | ICD-10-CM | POA: Insufficient documentation

## 2018-07-24 DIAGNOSIS — Z79890 Hormone replacement therapy (postmenopausal): Secondary | ICD-10-CM | POA: Diagnosis not present

## 2018-07-24 DIAGNOSIS — Z90711 Acquired absence of uterus with remaining cervical stump: Secondary | ICD-10-CM | POA: Insufficient documentation

## 2018-07-24 DIAGNOSIS — Z950 Presence of cardiac pacemaker: Secondary | ICD-10-CM | POA: Diagnosis not present

## 2018-07-24 DIAGNOSIS — N189 Chronic kidney disease, unspecified: Secondary | ICD-10-CM | POA: Diagnosis not present

## 2018-07-24 DIAGNOSIS — Z888 Allergy status to other drugs, medicaments and biological substances status: Secondary | ICD-10-CM | POA: Insufficient documentation

## 2018-07-24 DIAGNOSIS — Z880 Allergy status to penicillin: Secondary | ICD-10-CM | POA: Insufficient documentation

## 2018-07-24 DIAGNOSIS — I129 Hypertensive chronic kidney disease with stage 1 through stage 4 chronic kidney disease, or unspecified chronic kidney disease: Secondary | ICD-10-CM | POA: Diagnosis not present

## 2018-07-24 DIAGNOSIS — Z960 Presence of urogenital implants: Secondary | ICD-10-CM | POA: Insufficient documentation

## 2018-07-24 DIAGNOSIS — C569 Malignant neoplasm of unspecified ovary: Secondary | ICD-10-CM | POA: Insufficient documentation

## 2018-07-24 DIAGNOSIS — Z79899 Other long term (current) drug therapy: Secondary | ICD-10-CM | POA: Insufficient documentation

## 2018-07-24 DIAGNOSIS — E039 Hypothyroidism, unspecified: Secondary | ICD-10-CM | POA: Insufficient documentation

## 2018-07-24 DIAGNOSIS — Z7982 Long term (current) use of aspirin: Secondary | ICD-10-CM | POA: Diagnosis not present

## 2018-07-24 HISTORY — PX: PORTA CATH INSERTION: CATH118285

## 2018-07-24 SURGERY — PORTA CATH INSERTION
Anesthesia: Moderate Sedation

## 2018-07-24 MED ORDER — LIDOCAINE-EPINEPHRINE (PF) 1 %-1:200000 IJ SOLN
INTRAMUSCULAR | Status: AC
  Filled 2018-07-24: qty 30

## 2018-07-24 MED ORDER — FAMOTIDINE 20 MG PO TABS: 40.0000 mg | ORAL_TABLET | Freq: Once | ORAL | Status: DC | PRN

## 2018-07-24 MED ORDER — SODIUM CHLORIDE FLUSH 0.9 % IV SOLN
INTRAVENOUS | Status: AC
  Filled 2018-07-24: qty 40

## 2018-07-24 MED ORDER — HYDROMORPHONE HCL 1 MG/ML IJ SOLN: 1.0000 mg | Freq: Once | INTRAMUSCULAR | Status: DC | PRN

## 2018-07-24 MED ORDER — MIDAZOLAM HCL 2 MG/2ML IJ SOLN
INTRAMUSCULAR | Status: DC | PRN
  Administered 2018-07-24: 1 mg via INTRAVENOUS

## 2018-07-24 MED ORDER — MIDAZOLAM HCL 5 MG/5ML IJ SOLN
INTRAMUSCULAR | Status: AC
  Filled 2018-07-24: qty 5

## 2018-07-24 MED ORDER — FENTANYL CITRATE (PF) 100 MCG/2ML IJ SOLN
INTRAMUSCULAR | Status: DC | PRN
  Administered 2018-07-24: 25 ug via INTRAVENOUS

## 2018-07-24 MED ORDER — CLINDAMYCIN PHOSPHATE 300 MG/50ML IV SOLN
INTRAVENOUS | Status: AC
  Administered 2018-07-24: 15:00:00
  Filled 2018-07-24: qty 50

## 2018-07-24 MED ORDER — METHYLPREDNISOLONE SODIUM SUCC 125 MG IJ SOLR: 125.0000 mg | Freq: Once | INTRAMUSCULAR | Status: DC | PRN

## 2018-07-24 MED ORDER — SODIUM CHLORIDE 0.9 % IV SOLN
Freq: Once | INTRAVENOUS | Status: AC
  Administered 2018-07-24: 15:00:00
  Filled 2018-07-24 (×2): qty 2

## 2018-07-24 MED ORDER — MIDAZOLAM HCL 2 MG/ML PO SYRP: 8.0000 mg | ORAL_SOLUTION | Freq: Once | ORAL | Status: DC | PRN

## 2018-07-24 MED ORDER — SODIUM CHLORIDE 0.9 % IV SOLN
INTRAVENOUS | Status: DC
  Administered 2018-07-24: 13:00:00 via INTRAVENOUS

## 2018-07-24 MED ORDER — ONDANSETRON HCL 4 MG/2ML IJ SOLN: 4.0000 mg | Freq: Four times a day (QID) | INTRAMUSCULAR | Status: DC | PRN

## 2018-07-24 MED ORDER — DIPHENHYDRAMINE HCL 50 MG/ML IJ SOLN: 50.0000 mg | Freq: Once | INTRAMUSCULAR | Status: DC | PRN

## 2018-07-24 MED ORDER — FENTANYL CITRATE (PF) 100 MCG/2ML IJ SOLN
INTRAMUSCULAR | Status: AC
  Filled 2018-07-24: qty 2

## 2018-07-24 SURGICAL SUPPLY — 7 items
KIT PORT POWER 8FR ISP CVUE (Port) ×3 IMPLANT
PACK ANGIOGRAPHY (CUSTOM PROCEDURE TRAY) ×3 IMPLANT
PAD GROUND ADULT SPLIT (MISCELLANEOUS) ×3 IMPLANT
PENCIL ELECTRO HAND CTR (MISCELLANEOUS) ×3 IMPLANT
SUT MNCRL AB 4-0 PS2 18 (SUTURE) ×3 IMPLANT
SUT PROLENE 0 CT 1 30 (SUTURE) ×3 IMPLANT
SUT VICRYL+ 3-0 36IN CT-1 (SUTURE) ×3 IMPLANT

## 2018-07-24 NOTE — Op Note (Signed)
      [facility]       Operative Note  Date: 07/24/2018  Preoperative diagnosis:  1. Ovarian cancer  Postoperative diagnosis:  Same as above  Procedures: #1. Ultrasound guidance for vascular access to the right internal jugular vein. #2. Fluoroscopic guidance for placement of catheter. #3. Placement of CT compatible Port-A-Cath, right internal jugular vein.  Surgeon: Leotis Pain, MD.   Anesthesia: Local with moderate conscious sedation for approximately 20  minutes using 1 mg of Versed and 25 mcg of Fentanyl  Fluoroscopy time: less than 1 minute  Contrast used: 0  Estimated blood loss: 5 cc  Indication for the procedure:  The patient is a 85 y.o.female with ovarian cancer.  The patient needs a Port-A-Cath for durable venous access, chemotherapy, lab draws, and CT scans. We are asked to place this. Risks and benefits were discussed and informed consent was obtained.  Description of procedure: The patient was brought to the vascular and interventional radiology suite.  Moderate conscious sedation was administered throughout the procedure during a face to face encounter with the patient with my supervision of the RN administering medicines and monitoring the patient's vital signs, pulse oximetry, telemetry and mental status throughout from the start of the procedure until the patient was taken to the recovery room. The right neck chest and shoulder were sterilely prepped and draped, and a sterile surgical field was created. Ultrasound was used to help visualize a patent right internal jugular vein. This was then accessed under direct ultrasound guidance without difficulty with the Seldinger needle and a permanent image was recorded. A J-wire was placed. After skin nick and dilatation, the peel-away sheath was then placed over the wire. I then anesthetized an area under the clavicle approximately 1-2 fingerbreadths. A transverse incision was created and an inferior pocket  was created with electrocautery and blunt dissection. The port was then brought onto the field, placed into the pocket and secured to the chest wall with 2 Prolene sutures. The catheter was connected to the port and tunneled from the subclavicular incision to the access site. Fluoroscopic guidance was then used to cut the catheter to an appropriate length. The catheter was then placed through the peel-away sheath and the peel-away sheath was removed. The catheter tip was parked in excellent location under fluorocoscopic guidance in the cavoatrial junction. The pocket was then irrigated with antibiotic impregnated saline and the wound was closed with a running 3-0 Vicryl and a 4-0 Monocryl. The access incision was closed with a single 4-0 Monocryl. The Huber needle was used to withdraw blood and flush the port with heparinized saline. Dermabond was then placed as a dressing. The patient tolerated the procedure well and was taken to the recovery room in stable condition.   Leotis Pain 07/24/2018 3:35 PM   This note was created with Dragon Medical transcription system. Any errors in dictation are purely unintentional.

## 2018-07-24 NOTE — H&P (Signed)
[facility] History & Physical Update  The patient was interviewed and re-examined.  The patient's previous History and Physical has been reviewed and is unchanged.  There is no change in the plan of care. We plan to proceed with the scheduled procedure.  Leotis Pain, MD  07/24/2018, 12:44 PM

## 2018-07-25 ENCOUNTER — Encounter: Payer: Self-pay | Admitting: Vascular Surgery

## 2018-07-25 DIAGNOSIS — Z79899 Other long term (current) drug therapy: Secondary | ICD-10-CM | POA: Diagnosis not present

## 2018-07-25 DIAGNOSIS — Z90710 Acquired absence of both cervix and uterus: Secondary | ICD-10-CM | POA: Diagnosis not present

## 2018-07-25 DIAGNOSIS — N1330 Unspecified hydronephrosis: Secondary | ICD-10-CM | POA: Diagnosis not present

## 2018-07-25 DIAGNOSIS — Z7982 Long term (current) use of aspirin: Secondary | ICD-10-CM | POA: Diagnosis not present

## 2018-07-25 DIAGNOSIS — R530 Neoplastic (malignant) related fatigue: Secondary | ICD-10-CM | POA: Diagnosis not present

## 2018-07-25 DIAGNOSIS — Z950 Presence of cardiac pacemaker: Secondary | ICD-10-CM | POA: Diagnosis not present

## 2018-07-25 DIAGNOSIS — C561 Malignant neoplasm of right ovary: Secondary | ICD-10-CM | POA: Diagnosis not present

## 2018-07-28 ENCOUNTER — Inpatient Hospital Stay: Payer: Medicare HMO

## 2018-07-28 ENCOUNTER — Inpatient Hospital Stay (HOSPITAL_BASED_OUTPATIENT_CLINIC_OR_DEPARTMENT_OTHER): Payer: Medicare HMO | Admitting: Internal Medicine

## 2018-07-28 ENCOUNTER — Inpatient Hospital Stay: Payer: Medicare HMO | Attending: Internal Medicine

## 2018-07-28 ENCOUNTER — Encounter: Payer: Self-pay | Admitting: Internal Medicine

## 2018-07-28 ENCOUNTER — Other Ambulatory Visit: Payer: Self-pay

## 2018-07-28 VITALS — BP 138/74 | HR 96 | Temp 97.6°F | Resp 20 | Ht 61.0 in | Wt 102.0 lb

## 2018-07-28 DIAGNOSIS — R110 Nausea: Secondary | ICD-10-CM | POA: Insufficient documentation

## 2018-07-28 DIAGNOSIS — Z5111 Encounter for antineoplastic chemotherapy: Secondary | ICD-10-CM | POA: Insufficient documentation

## 2018-07-28 DIAGNOSIS — R630 Anorexia: Secondary | ICD-10-CM | POA: Insufficient documentation

## 2018-07-28 DIAGNOSIS — R188 Other ascites: Secondary | ICD-10-CM | POA: Diagnosis not present

## 2018-07-28 DIAGNOSIS — R197 Diarrhea, unspecified: Secondary | ICD-10-CM

## 2018-07-28 DIAGNOSIS — C561 Malignant neoplasm of right ovary: Secondary | ICD-10-CM | POA: Diagnosis not present

## 2018-07-28 DIAGNOSIS — K222 Esophageal obstruction: Secondary | ICD-10-CM | POA: Insufficient documentation

## 2018-07-28 DIAGNOSIS — R000 Tachycardia, unspecified: Secondary | ICD-10-CM | POA: Diagnosis not present

## 2018-07-28 DIAGNOSIS — N3000 Acute cystitis without hematuria: Secondary | ICD-10-CM | POA: Diagnosis not present

## 2018-07-28 DIAGNOSIS — C569 Malignant neoplasm of unspecified ovary: Secondary | ICD-10-CM

## 2018-07-28 DIAGNOSIS — K5790 Diverticulosis of intestine, part unspecified, without perforation or abscess without bleeding: Secondary | ICD-10-CM | POA: Diagnosis not present

## 2018-07-28 DIAGNOSIS — R634 Abnormal weight loss: Secondary | ICD-10-CM

## 2018-07-28 DIAGNOSIS — Z95828 Presence of other vascular implants and grafts: Secondary | ICD-10-CM

## 2018-07-28 LAB — CBC WITH DIFFERENTIAL/PLATELET
Abs Immature Granulocytes: 0.02 10*3/uL (ref 0.00–0.07)
Basophils Absolute: 0 10*3/uL (ref 0.0–0.1)
Basophils Relative: 1 %
Eosinophils Absolute: 0.3 10*3/uL (ref 0.0–0.5)
Eosinophils Relative: 5 %
HCT: 34.6 % — ABNORMAL LOW (ref 36.0–46.0)
Hemoglobin: 10.9 g/dL — ABNORMAL LOW (ref 12.0–15.0)
Immature Granulocytes: 0 %
Lymphocytes Relative: 23 %
Lymphs Abs: 1.4 10*3/uL (ref 0.7–4.0)
MCH: 29.3 pg (ref 26.0–34.0)
MCHC: 31.5 g/dL (ref 30.0–36.0)
MCV: 93 fL (ref 80.0–100.0)
Monocytes Absolute: 0.5 10*3/uL (ref 0.1–1.0)
Monocytes Relative: 8 %
Neutro Abs: 4 10*3/uL (ref 1.7–7.7)
Neutrophils Relative %: 63 %
Platelets: 256 10*3/uL (ref 150–400)
RBC: 3.72 MIL/uL — ABNORMAL LOW (ref 3.87–5.11)
RDW: 13.2 % (ref 11.5–15.5)
WBC: 6.3 10*3/uL (ref 4.0–10.5)
nRBC: 0 % (ref 0.0–0.2)

## 2018-07-28 LAB — COMPREHENSIVE METABOLIC PANEL
ALT: 13 U/L (ref 0–44)
AST: 27 U/L (ref 15–41)
Albumin: 3.4 g/dL — ABNORMAL LOW (ref 3.5–5.0)
Alkaline Phosphatase: 58 U/L (ref 38–126)
Anion gap: 8 (ref 5–15)
BUN: 18 mg/dL (ref 8–23)
CO2: 29 mmol/L (ref 22–32)
Calcium: 9.3 mg/dL (ref 8.9–10.3)
Chloride: 103 mmol/L (ref 98–111)
Creatinine, Ser: 0.89 mg/dL (ref 0.44–1.00)
GFR calc Af Amer: >60 mL/min (ref >60)
GFR calc non Af Amer: 57 mL/min — ABNORMAL LOW (ref >60)
Glucose, Bld: 145 mg/dL — ABNORMAL HIGH (ref 70–99)
Potassium: 3.6 mmol/L (ref 3.5–5.1)
Sodium: 140 mmol/L (ref 135–145)
Total Bilirubin: 0.6 mg/dL (ref 0.3–1.2)
Total Protein: 7.1 g/dL (ref 6.5–8.1)

## 2018-07-28 MED ORDER — SODIUM CHLORIDE 0.9 % IV SOLN
Freq: Once | INTRAVENOUS | Status: AC
  Administered 2018-07-28: 12:00:00 via INTRAVENOUS
  Filled 2018-07-28: qty 5

## 2018-07-28 MED ORDER — PALONOSETRON HCL INJECTION 0.25 MG/5ML
0.2500 mg | Freq: Once | INTRAVENOUS | Status: AC
  Administered 2018-07-28: 0.25 mg via INTRAVENOUS
  Filled 2018-07-28: qty 5

## 2018-07-28 MED ORDER — FAMOTIDINE IN NACL 20-0.9 MG/50ML-% IV SOLN
20.0000 mg | Freq: Once | INTRAVENOUS | Status: AC
  Administered 2018-07-28: 11:00:00 20 mg via INTRAVENOUS
  Filled 2018-07-28: qty 50

## 2018-07-28 MED ORDER — SODIUM CHLORIDE 0.9% FLUSH
10.0000 mL | Freq: Once | INTRAVENOUS | Status: AC
  Administered 2018-07-28: 10 mL via INTRAVENOUS
  Filled 2018-07-28: qty 10

## 2018-07-28 MED ORDER — HEPARIN SOD (PORK) LOCK FLUSH 100 UNIT/ML IV SOLN
500.0000 [IU] | Freq: Once | INTRAVENOUS | Status: AC | PRN
  Administered 2018-07-28: 500 [IU]
  Filled 2018-07-28: qty 5

## 2018-07-28 MED ORDER — DIPHENHYDRAMINE HCL 50 MG/ML IJ SOLN
12.5000 mg | Freq: Once | INTRAMUSCULAR | Status: AC
  Administered 2018-07-28: 11:00:00 12.5 mg via INTRAVENOUS
  Filled 2018-07-28: qty 1

## 2018-07-28 MED ORDER — SODIUM CHLORIDE 0.9 % IV SOLN
Freq: Once | INTRAVENOUS | Status: AC
  Administered 2018-07-28: 11:00:00 via INTRAVENOUS
  Filled 2018-07-28: qty 250

## 2018-07-28 MED ORDER — SODIUM CHLORIDE 0.9 % IV SOLN
110.0000 mg | Freq: Once | INTRAVENOUS | Status: AC
  Administered 2018-07-28: 12:00:00 110 mg via INTRAVENOUS
  Filled 2018-07-28: qty 11

## 2018-07-28 NOTE — Patient Instructions (Signed)
#   DO NOT TAKE DEXAMETHASONE after chemo.

## 2018-07-28 NOTE — Assessment & Plan Note (Addendum)
#   recurrent adenocarcinoma of ovary/ with ascites; status post paracentesis.  #Plan carbotaxol [AUC 2; Taxol 60 mg/m] weekly dose.  Discussed treatments are palliative and usually indefinite/under progression or intolerance.  Discussed would follow CA 1-5 closely/and also imaging 2 to 3 months response assessment.  #Today would recommend carboplatin alone;  labs today reviewed;  acceptable for treatment today.  If tolerating well without any significant side effects proceed with adding Taxol starting next week.  #Left hydronephrosis s/p stenting; renal function improved.  #Poor appetite likely secondary to the underlying malignancy.  Recommend referral to [facility], nutrition.  # Diarrhea-question etiology-likely due to malignancy.  Discussed dietary intervention.  Recommend Imodium.  #I spoke to patient's daughter, haelee bolen- [phone] regarding above plan.  # DISPOSITION: # chemo- carbo  today # refer to [facility] # in 1 week-MD- labs-cbc/cmp; carbo-taxol Renaldo Harrison dose]- Dr.B  Addendum: Patient/family expressed wishes to transfer her care to Dr. Janese Banks; who kindly agrees to take over the pts' care moving forward.

## 2018-07-28 NOTE — Progress Notes (Signed)
Keep premeds as ordered per md

## 2018-07-28 NOTE — Progress Notes (Signed)
[place] NOTE  Patient Care Team: Rusty Aus, MD as PCP - General (Internal Medicine) Clent Jacks, RN as Registered Nurse  CHIEF COMPLAINTS/PURPOSE OF CONSULTATION:    #  Oncology History   # NOV 2013-RIGHT OVARIAN CANCER-HIGH GRADE SEROUS CA 125-996 STAGE III [Dr[facility]Stephan[facility]; [facility]];Jan 2014- neo-adj chemo x3 M;April 2014 TAH & BSO ; chemo x 38M [finished C-6; July 2014]. Ca 125 -13; N  # GENETIC TESTING-"OncoGene Dx"-NEG  # May-June 2020- Recurrent adenocarcinoma [ascites s/p para; [facility]] ca-[phone].   # June 8th 2020- Carbo AUC 2  # may 2020- Left hydro-ureteral -n ephrosis [s/p stenting]; diarrhea-? malignancy s/p sigmoidscopy  [[facility]]  CT- showed severe left hydro ureteral nephrosis.  Sigmoid wall thickening differential includes carcinomatosis and colitis.  She did had a repeat scan on 07/09/2018 which showed interval increase in ascites mostly within the pelvis with associated enhancement of the peritoneum suggesting carcinomatosis.  Redemonstration of sigmoid wall thickening which may represent colitis versus carcinomatosis.  Delayed left nephrogram with severe left hydro-ureteronephrosis similar to prior.  Given findings of colitis as well as ongoing symptoms of melena and diarrhea patient underwent EGD as well as flexible sigmoidoscopy which showed Schatzki's ring and diverticulosis but no evidence of tumor.  Her symptoms are possibly secondary to carcinomatosis.  She also had other stool studies done including C. difficile and stool cultures which were negative.Patient did undergo a diagnostic tap of her ascites on 07/09/2018 which was consistent with adenocarcinoma  S/p pacemaker [Dr.Parascholes] --------------------------------------   DIAGNOSIS: recurrent ovarain cancer  STAGE:    IV  ;GOALS: pallaitive  CURRENT/MOST RECENT THERAPY: weekly carbo-Taxol       Malignant neoplasm of ovary ([place])   11/23/2015 Initial  Diagnosis    Ovarian cancer, unspecified laterality ([place])    07/28/2018 -  Chemotherapy    The patient had palonosetron (ALOXI) injection 0.25 mg, 0.25 mg, Intravenous,  Once, 1 of 6 cycles Administration: 0.25 mg (07/28/2018) CARBOplatin (PARAPLATIN) 110 mg in sodium chloride 0.9 % 100 mL chemo infusion, 110 mg (100 % of original dose 113.4 mg), Intravenous,  Once, 1 of 6 cycles Dose modification:   (original dose 113.4 mg, Cycle 1) Administration: 110 mg (07/28/2018) PACLitaxel (TAXOL) 84 mg in sodium chloride 0.9 % 250 mL chemo infusion (> 80mg /m2), 60 mg/m2 = 84 mg (100 % of original dose 60 mg/m2), Intravenous,  Once, 1 of 6 cycles Dose modification: 60 mg/m2 (original dose 60 mg/m2, Cycle 1, Reason: Other (see comments), Comment: weekly) fosaprepitant (EMEND) 150 mg, dexamethasone (DECADRON) 12 mg in sodium chloride 0.9 % 145 mL IVPB, , Intravenous,  Once, 1 of 6 cycles Administration:  (07/28/2018)  for chemotherapy treatment.       HISTORY OF PRESENTING ILLNESS:  Natalie Stone 85 y.o.  female above history of recurrent high-grade serous ovarian cancer status post paracentesis at [facility]   In the interim patient has had Mediport placed.  She complains of mild abdominal discomfort.  Denies any constipation; mild loose stools 1-2 a day.  She has not been taking Imodium.  Denies any tingling or numbness.  Chronic mild nausea no vomiting.  Review of Systems  Constitutional: Negative for chills, diaphoresis, fever, malaise/fatigue and weight loss.  HENT: Negative for nosebleeds and sore throat.   Eyes: Negative for double vision.  Respiratory: Negative for cough, hemoptysis, sputum production, shortness of breath and wheezing.   Cardiovascular: Negative for chest pain, palpitations, orthopnea and leg swelling.  Gastrointestinal: Positive for  nausea. Negative for abdominal pain, blood in stool, constipation, diarrhea, heartburn, melena and vomiting.       Mild abdominal distention.   Genitourinary: Negative for dysuria, frequency and urgency.  Musculoskeletal: Negative for back pain and joint pain.  Skin: Negative.  Negative for itching and rash.  Neurological: Negative for dizziness, tingling, focal weakness, weakness and headaches.  Endo/Heme/Allergies: Does not bruise/bleed easily.  Psychiatric/Behavioral: Negative for depression. The patient is not nervous/anxious and does not have insomnia.      MEDICAL HISTORY:  Past Medical History:  Diagnosis Date  . CAD (coronary artery disease)   . Chronic kidney disease    urinary incontinence  . Hyperthyroidism   . Hypothyroidism (acquired)   . Ovarian cancer ([place]) 2014  . Shingles   . Third degree AV block ([place]) 10/09/2015    SURGICAL HISTORY: Past Surgical History:  Procedure Laterality Date  . ABDOMINAL HYSTERECTOMY    . BLADDER REPAIR    . CORONARY ANGIOPLASTY WITH STENT PLACEMENT  2001   Dr. Isaiah Blakes in [place]  . CYSTOURETHROSCOPY, WITH INSERTION OF INDWELLING URETERAL STENT (EG,GIBBONS OR DOUBLE-J TYPE) (Left Ureter Left   . PACEMAKER INSERTION N/A 10/11/2015   Procedure: INSERTION PACEMAKER;  Surgeon: [name], MD;  Location: [facility] ORS;  Service: Cardiovascular;  Laterality: N/A;  . PARTIAL HYSTERECTOMY    . PORTA CATH INSERTION N/A 07/24/2018   Procedure: PORTA CATH INSERTION;  Surgeon: Algernon Huxley, MD;  Location: [facility];  Service: Cardiovascular;  Laterality: N/A;    SOCIAL HISTORY:  Social History   Socioeconomic History  . Marital status: Widowed    Spouse name: Not on file  . Number of children: Not on file  . Years of education: Not on file  . Highest education level: Not on file  Occupational History  . Not on file  Social Needs  . Financial resource strain: Not on file  . Food insecurity:    Worry: Not on file    Inability: Not on file  . Transportation needs:    Medical: Not on file    Non-medical: Not on file  Tobacco Use  . Smoking status: Never Smoker   . Smokeless tobacco: Never Used  Substance and Sexual Activity  . Alcohol use: No  . Drug use: No  . Sexual activity: Never  Lifestyle  . Physical activity:    Days per week: Not on file    Minutes per session: Not on file  . Stress: Not on file  Relationships  . Social connections:    Talks on phone: Not on file    Gets together: Not on file    Attends religious service: Not on file    Active member of club or organization: Not on file    Attends meetings of clubs or organizations: Not on file    Relationship status: Not on file  . Intimate partner violence:    Fear of current or ex partner: Not on file    Emotionally abused: Not on file    Physically abused: Not on file    Forced sexual activity: Not on file  Other Topics Concern  . 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 Diabetes Brother     ALLERGIES:  is allergic to penicillins; ativan [lorazepam]; and benadryl [diphenhydramine].  MEDICATIONS:  Current Outpatient Medications  Medication Sig Dispense Refill  . aspirin EC 81 MG tablet Take 81 mg by mouth daily.    Marland Kitchen atorvastatin (LIPITOR) 10 MG tablet Take 10 mg by mouth at bedtime.     Marland Kitchen Bioflavonoid Products (ESTER C PO) Take 500 mg by mouth daily.     . Calcium Carbonate-Vitamin D (CALTRATE 600+D PO) Take 1 tablet by mouth daily.    . Cholecalciferol (VITAMIN D3) 1000 units CAPS Take 1,000 Units by mouth daily.     . Coenzyme Q10 (COQ10) 100 MG CAPS Take 100 mg by mouth daily.    . Glucosamine-MSM-Hyaluronic Acd (JOINT HEALTH PO) Take 1 tablet by mouth 2 (two) times a day.     . levothyroxine (SYNTHROID) 100 MCG tablet Take 100 mcg by mouth daily before breakfast.     . mirtazapine (REMERON) 7.5 MG tablet Take 7.5 mg by mouth at bedtime.    Marland Kitchen omeprazole (PRILOSEC) 40 MG capsule Take 40 mg by mouth daily.    Marland Kitchen b complex  vitamins tablet Take 1 tablet by mouth daily.     No current facility-administered medications for this visit.       Marland Kitchen  PHYSICAL EXAMINATION: ECOG PERFORMANCE STATUS: 0 - Asymptomatic  Vitals:   07/28/18 1002  BP: 138/74  Pulse: 96  Resp: 20  Temp: 97.6 F (36.4 C)   Filed Weights   07/28/18 1002  Weight: 102 lb (46.3 kg)   Physical Exam  Constitutional: She is oriented to person, place, and time and well-developed, well-nourished, and in no distress.  Elderly frail-appearing Caucasian female patient.  She is walking by herself.  No acute distress.  HENT:  Head: Normocephalic and atraumatic.  Mouth/Throat: Oropharynx is clear and moist. No oropharyngeal exudate.  Eyes: Pupils are equal, round, and reactive to light.  Neck: Normal range of motion. Neck supple.  Cardiovascular: Normal rate and regular rhythm.  Pulmonary/Chest: Effort normal and breath sounds normal. No respiratory distress. She has no wheezes.  Abdominal: Soft. Bowel sounds are normal. She exhibits no distension and no mass. There is no abdominal tenderness. There is no rebound and no guarding.  Musculoskeletal: Normal range of motion.        General: No tenderness or edema.  Neurological: She is alert and oriented to person, place, and time.  Skin: Skin is warm.  Psychiatric: Affect normal.     LABORATORY DATA:  I have reviewed the data as listed Lab Results  Component Value Date   WBC 6.3 07/28/2018   HGB 10.9 (L) 07/28/2018   HCT 34.6 (L) 07/28/2018   MCV 93.0 07/28/2018   PLT 256 07/28/2018   Recent Labs    08/21/17 1219 10/30/17 1303 12/23/17 1053 07/28/18 0926  NA  --   --   --  140  K  --   --   --  3.6  CL  --   --   --  103  CO2  --   --   --  29  GLUCOSE  --   --   --  145*  BUN  --   --   --  18  CREATININE 0.72 0.70 0.70 0.89  CALCIUM  --   --   --  9.3  GFRNONAA >60  --   --  57*  GFRAA >60  --   --  >60  PROT  --   --   --  7.1  ALBUMIN  --   --   --  3.4*  AST  --    --   --  27  ALT  --   --   --  13  ALKPHOS  --   --   --  58  BILITOT  --   --   --  0.6    RADIOGRAPHIC STUDIES: I have personally reviewed the radiological images as listed and agreed with the findings in the report. No results found.  ASSESSMENT & PLAN:   Malignant neoplasm of ovary ([place]) # recurrent adenocarcinoma of ovary/ with ascites; status post paracentesis.  #Plan carbotaxol [AUC 2; Taxol 60 mg/m] weekly dose.  Discussed treatments are palliative and usually indefinite/under progression or intolerance.  Discussed would follow CA 1-5 closely/and also imaging 2 to 3 months response assessment.  #Today would recommend carboplatin alone;  labs today reviewed;  acceptable for treatment today.  If tolerating well without any significant side effects proceed with adding Taxol starting next week.  #Left hydronephrosis s/p stenting; renal function improved.  #Poor appetite likely secondary to the underlying malignancy.  Recommend referral to [facility], nutrition.  # Diarrhea-question etiology-likely due to malignancy.  Discussed dietary intervention.  Recommend Imodium.  #I spoke to patient's daughter, Natalie Stone- [phone] regarding above plan.  # DISPOSITION: # chemo- carbo  today # refer to [place] # in 1 week-MD- labs-cbc/cmp; carbo-taxol Renaldo Harrison dose]- Dr.B  Addendum: Patient/family expressed wishes to transfer her care to Dr. Janese Banks; who kindly agrees to take over the pts' care moving forward.    Cammie Sickle, MD 07/29/2018 1:00 PM

## 2018-07-29 ENCOUNTER — Encounter: Payer: Self-pay | Admitting: Internal Medicine

## 2018-07-29 LAB — CA 125: Cancer Antigen (CA) 125: 1066 U/mL — ABNORMAL HIGH (ref 0.0–38.1)

## 2018-07-30 DIAGNOSIS — Z7982 Long term (current) use of aspirin: Secondary | ICD-10-CM | POA: Diagnosis not present

## 2018-07-30 DIAGNOSIS — Z90710 Acquired absence of both cervix and uterus: Secondary | ICD-10-CM | POA: Diagnosis not present

## 2018-07-30 DIAGNOSIS — R530 Neoplastic (malignant) related fatigue: Secondary | ICD-10-CM | POA: Diagnosis not present

## 2018-07-30 DIAGNOSIS — Z950 Presence of cardiac pacemaker: Secondary | ICD-10-CM | POA: Diagnosis not present

## 2018-07-30 DIAGNOSIS — N1330 Unspecified hydronephrosis: Secondary | ICD-10-CM | POA: Diagnosis not present

## 2018-07-30 DIAGNOSIS — C561 Malignant neoplasm of right ovary: Secondary | ICD-10-CM | POA: Diagnosis not present

## 2018-07-30 DIAGNOSIS — Z79899 Other long term (current) drug therapy: Secondary | ICD-10-CM | POA: Diagnosis not present

## 2018-08-01 ENCOUNTER — Other Ambulatory Visit: Payer: Self-pay

## 2018-08-04 ENCOUNTER — Inpatient Hospital Stay: Payer: Medicare HMO

## 2018-08-04 ENCOUNTER — Other Ambulatory Visit: Payer: Self-pay

## 2018-08-04 ENCOUNTER — Inpatient Hospital Stay (HOSPITAL_BASED_OUTPATIENT_CLINIC_OR_DEPARTMENT_OTHER): Payer: Medicare HMO | Admitting: Hospice and Palliative Medicine

## 2018-08-04 ENCOUNTER — Encounter: Payer: Self-pay | Admitting: Nurse Practitioner

## 2018-08-04 ENCOUNTER — Inpatient Hospital Stay (HOSPITAL_BASED_OUTPATIENT_CLINIC_OR_DEPARTMENT_OTHER): Payer: Medicare HMO | Admitting: Nurse Practitioner

## 2018-08-04 VITALS — BP 108/67 | HR 108 | Temp 97.5°F | Resp 18 | Ht 61.0 in | Wt 99.7 lb

## 2018-08-04 VITALS — HR 100

## 2018-08-04 DIAGNOSIS — Z515 Encounter for palliative care: Secondary | ICD-10-CM

## 2018-08-04 DIAGNOSIS — Z923 Personal history of irradiation: Secondary | ICD-10-CM

## 2018-08-04 DIAGNOSIS — N3000 Acute cystitis without hematuria: Secondary | ICD-10-CM | POA: Diagnosis not present

## 2018-08-04 DIAGNOSIS — C561 Malignant neoplasm of right ovary: Secondary | ICD-10-CM | POA: Diagnosis not present

## 2018-08-04 DIAGNOSIS — K5790 Diverticulosis of intestine, part unspecified, without perforation or abscess without bleeding: Secondary | ICD-10-CM | POA: Diagnosis not present

## 2018-08-04 DIAGNOSIS — C569 Malignant neoplasm of unspecified ovary: Secondary | ICD-10-CM

## 2018-08-04 DIAGNOSIS — R000 Tachycardia, unspecified: Secondary | ICD-10-CM | POA: Diagnosis not present

## 2018-08-04 DIAGNOSIS — R188 Other ascites: Secondary | ICD-10-CM | POA: Diagnosis not present

## 2018-08-04 DIAGNOSIS — R630 Anorexia: Secondary | ICD-10-CM | POA: Diagnosis not present

## 2018-08-04 DIAGNOSIS — Z5111 Encounter for antineoplastic chemotherapy: Secondary | ICD-10-CM | POA: Diagnosis not present

## 2018-08-04 DIAGNOSIS — R197 Diarrhea, unspecified: Secondary | ICD-10-CM | POA: Diagnosis not present

## 2018-08-04 DIAGNOSIS — K222 Esophageal obstruction: Secondary | ICD-10-CM | POA: Diagnosis not present

## 2018-08-04 LAB — COMPREHENSIVE METABOLIC PANEL
ALT: 18 U/L (ref 0–44)
AST: 31 U/L (ref 15–41)
Albumin: 3.7 g/dL (ref 3.5–5.0)
Alkaline Phosphatase: 77 U/L (ref 38–126)
Anion gap: 10 (ref 5–15)
BUN: 19 mg/dL (ref 8–23)
CO2: 27 mmol/L (ref 22–32)
Calcium: 9.5 mg/dL (ref 8.9–10.3)
Chloride: 101 mmol/L (ref 98–111)
Creatinine, Ser: 0.92 mg/dL (ref 0.44–1.00)
GFR calc Af Amer: >60 mL/min (ref >60)
GFR calc non Af Amer: 55 mL/min — ABNORMAL LOW (ref >60)
Glucose, Bld: 147 mg/dL — ABNORMAL HIGH (ref 70–99)
Potassium: 4 mmol/L (ref 3.5–5.1)
Sodium: 138 mmol/L (ref 135–145)
Total Bilirubin: 0.5 mg/dL (ref 0.3–1.2)
Total Protein: 7.4 g/dL (ref 6.5–8.1)

## 2018-08-04 LAB — CBC WITH DIFFERENTIAL/PLATELET
Abs Immature Granulocytes: 0.03 10*3/uL (ref 0.00–0.07)
Basophils Absolute: 0.1 10*3/uL (ref 0.0–0.1)
Basophils Relative: 1 %
Eosinophils Absolute: 0.3 10*3/uL (ref 0.0–0.5)
Eosinophils Relative: 4 %
HCT: 35.7 % — ABNORMAL LOW (ref 36.0–46.0)
Hemoglobin: 11.5 g/dL — ABNORMAL LOW (ref 12.0–15.0)
Immature Granulocytes: 0 %
Lymphocytes Relative: 23 %
Lymphs Abs: 1.7 10*3/uL (ref 0.7–4.0)
MCH: 29.9 pg (ref 26.0–34.0)
MCHC: 32.2 g/dL (ref 30.0–36.0)
MCV: 93 fL (ref 80.0–100.0)
Monocytes Absolute: 0.7 10*3/uL (ref 0.1–1.0)
Monocytes Relative: 9 %
Neutro Abs: 4.7 10*3/uL (ref 1.7–7.7)
Neutrophils Relative %: 63 %
Platelets: 269 10*3/uL (ref 150–400)
RBC: 3.84 MIL/uL — ABNORMAL LOW (ref 3.87–5.11)
RDW: 13.3 % (ref 11.5–15.5)
WBC: 7.4 10*3/uL (ref 4.0–10.5)
nRBC: 0 % (ref 0.0–0.2)

## 2018-08-04 MED ORDER — ONDANSETRON HCL 4 MG PO TABS: 4.0000 mg | ORAL_TABLET | Freq: Three times a day (TID) | ORAL | 0 refills | Status: DC | PRN

## 2018-08-04 MED ORDER — PALONOSETRON HCL INJECTION 0.25 MG/5ML
0.2500 mg | Freq: Once | INTRAVENOUS | Status: AC
  Administered 2018-08-04: 0.25 mg via INTRAVENOUS
  Filled 2018-08-04: qty 5

## 2018-08-04 MED ORDER — SODIUM CHLORIDE 0.9 % IV SOLN
113.4000 mg | Freq: Once | INTRAVENOUS | Status: AC
  Administered 2018-08-04: 110 mg via INTRAVENOUS
  Filled 2018-08-04: qty 11

## 2018-08-04 MED ORDER — HEPARIN SOD (PORK) LOCK FLUSH 100 UNIT/ML IV SOLN
500.0000 [IU] | Freq: Once | INTRAVENOUS | Status: AC | PRN
  Administered 2018-08-04: 500 [IU]
  Filled 2018-08-04: qty 5

## 2018-08-04 MED ORDER — SODIUM CHLORIDE 0.9 % IV SOLN
Freq: Once | INTRAVENOUS | Status: AC
  Administered 2018-08-04: 11:00:00 via INTRAVENOUS
  Filled 2018-08-04: qty 250

## 2018-08-04 MED ORDER — DIPHENHYDRAMINE HCL 50 MG/ML IJ SOLN
12.5000 mg | Freq: Once | INTRAMUSCULAR | Status: AC
  Administered 2018-08-04: 12.5 mg via INTRAVENOUS
  Filled 2018-08-04: qty 1

## 2018-08-04 MED ORDER — SODIUM CHLORIDE 0.9 % IV SOLN
Freq: Once | INTRAVENOUS | Status: AC
  Administered 2018-08-04: 12:00:00 via INTRAVENOUS
  Filled 2018-08-04: qty 5

## 2018-08-04 MED ORDER — FAMOTIDINE IN NACL 20-0.9 MG/50ML-% IV SOLN
20.0000 mg | Freq: Once | INTRAVENOUS | Status: AC
  Administered 2018-08-04: 20 mg via INTRAVENOUS
  Filled 2018-08-04: qty 50

## 2018-08-04 NOTE — Progress Notes (Signed)
Per Dr, Janese Banks proceed with ordered pre-medcations and Carboplatin, pt to NOT receive Taxol at this time.

## 2018-08-04 NOTE — Progress Notes (Signed)
Hematology/Oncology Progress Note [facility]  [phone] [phone]  Patient Care Team: Rusty Aus, MD as PCP - General (Internal Medicine) Clent Jacks, RN as Registered Nurse   Name of the patient: Natalie Stone  [id number]  October 29, 1926   Date of visit: 08/04/18  Diagnosis- Recurrent Ovarian Cancer  Chief complaint/ Reason for visit- Assessment prior to chemotherapy  Heme/Onc history: Patient is a 85 year old female with a past medical history significant for stage III ovarian cancer that was treated in [place] and 2013.  She underwent neoadjuvant chemotherapy followed by surgery with Dr. Beverly Milch followed by adjuvant chemotherapy.  Genetic testing was negative.  She then established care at [facility] in 2017.  More recently patient was found to have a consistently increasing CA-125 level from 42.6 in December 2018 to 781 in February 2020.  Most recent CA-125 was 02/21/2005 on 07/06/2018.  Scans were however negative for any recurrence.  Patient had CT abdomen and pelvis with contrast done on 07/06/2018 which showed severe left hydro ureteral nephrosis.  Sigmoid wall thickening differential includes carcinomatosis and colitis.  She did had a repeat scan on 07/09/2018 which showed interval increase in ascites mostly within the pelvis with associated enhancement of the peritoneum suggesting carcinomatosis.  Redemonstration of sigmoid wall thickening which may represent colitis versus carcinomatosis.  Delayed left nephrogram with severe left hydro-ureteronephrosis similar to prior.  Given findings of colitis as well as ongoing symptoms of melena and diarrhea patient underwent EGD as well as flexible sigmoidoscopy which showed Schatzki's ring and diverticulosis but no evidence of tumor.  Her symptoms are possibly secondary to carcinomatosis.  She also had other stool studies done including C. difficile and stool cultures which were negative.Patient did  undergo a diagnostic tap of her ascites on 07/09/2018 which was consistent with adenocarcinoma  Patient was seen by Dr. Fransisca Connors and restarting carb-taxol with palliative intent was discussed. Given patient's increased frailty as compared to 2013 weekly carbo-taxol instead of every 3 weeks was discussed. Dr. Fransisca Connors recommended starting carboplatin alone for the first cycle and if she tolerates it well proceed with carbo-taxol with cycle 2.  Due to poor vascular access she underwent port placement. She saw Dr. Rogue Bussing for consideration of cycle 1 on 07/28/2018 and received Carboplatin - AUC 2.   Interval history- [name], 85 year old female with above history of recurrent high grade serous ovarian cancer s/p carbo (AUC 2) on 07/28/2018, returns to clinic for consideration of cycle 2 of carbo.   She saw Dr. Rogue Bussing for consideration of cycle 1. Adding taxol for cycle 2 was discussed. Today, patient says she tolerated cycle 1 well without significant side effects and she was under the impression that she would only receive [place] for the first three cycles and she would like to continue with [place] only today. Mild intermittent nausea; did not take anti-emetics. She continues to have diarrhea which she describes as mucous like. She has not taken anything for her symptoms. Continues to use Ensure to supplement meals but overall feels appetite is reduced. Weight is down today. Denies abdominal bloating/distention or pain. Fatigue is somewhat improved.   ECOG PS- 2 Pain scale- 0 Opioid associated constipation- no  Review of systems- Review of Systems  Constitutional: Positive for malaise/fatigue and weight loss. Negative for chills and fever.  HENT: Negative.   Eyes: Negative.   Respiratory: Negative.  Negative for cough and shortness of breath.   Cardiovascular: Negative.  Negative for chest pain  and leg swelling.  Gastrointestinal: Positive for diarrhea. Negative for abdominal pain, blood in  stool, melena, nausea and vomiting.  Genitourinary: Negative.  Negative for dysuria and urgency.  Musculoskeletal: Negative.  Negative for falls and myalgias.  Skin: Negative.  Negative for rash.  Neurological: Negative.  Negative for dizziness, loss of consciousness and headaches.  Psychiatric/Behavioral: Negative.  Negative for depression. The patient is not nervous/anxious and does not have insomnia.      Allergies  Allergen Reactions  . Penicillins Anaphylaxis    Has patient had a PCN reaction causing immediate rash, facial/tongue/throat swelling, SOB or lightheadedness with hypotension: Yes Has patient had a PCN reaction causing severe rash involving mucus membranes or skin necrosis: No Has patient had a PCN reaction that required hospitalization No Has patient had a PCN reaction occurring within the last 10 years: No If all of the above answers are "NO", then may proceed with Cephalosporin use.  . Ativan [Lorazepam] Other (See Comments)    Made her not remember for many hours.    Past Medical History:  Diagnosis Date  . CAD (coronary artery disease)   . Chronic kidney disease    urinary incontinence  . Hyperthyroidism   . Hypothyroidism (acquired)   . Ovarian cancer ([place]) 2014  . Shingles   . Third degree AV block ([place]) 10/09/2015    Past Surgical History:  Procedure Laterality Date  . ABDOMINAL HYSTERECTOMY    . BLADDER REPAIR    . CORONARY ANGIOPLASTY WITH STENT PLACEMENT  2001   Dr. Isaiah Blakes in [place]  . CYSTOURETHROSCOPY, WITH INSERTION OF INDWELLING URETERAL STENT (EG,GIBBONS OR DOUBLE-J TYPE) (Left Ureter Left   . PACEMAKER INSERTION N/A 10/11/2015   Procedure: INSERTION PACEMAKER;  Surgeon: [name], MD;  Location: [facility] ORS;  Service: Cardiovascular;  Laterality: N/A;  . PARTIAL HYSTERECTOMY    . PORTA CATH INSERTION N/A 07/24/2018   Procedure: PORTA CATH INSERTION;  Surgeon: Algernon Huxley, MD;  Location: [facility];  Service: Cardiovascular;   Laterality: N/A;    Social History   Socioeconomic History  . Marital status: Widowed    Spouse name: Not on file  . Number of children: Not on file  . Years of education: Not on file  . Highest education level: Not on file  Occupational History  . Not on file  Social Needs  . Financial resource strain: Not on file  . Food insecurity    Worry: Not on file    Inability: Not on file  . Transportation needs    Medical: Not on file    Non-medical: Not on file  Tobacco Use  . Smoking status: Never Smoker  . Smokeless tobacco: Never Used  Substance and Sexual Activity  . Alcohol use: No  . Drug use: No  . Sexual activity: Never  Lifestyle  . Physical activity    Days per week: Not on file    Minutes per session: Not on file  . Stress: Not on file  Relationships  . Social Herbalist on phone: Not on file    Gets together: Not on file    Attends religious service: Not on file    Active member of club or organization: Not on file    Attends meetings of clubs or organizations: Not on file    Relationship status: Not on file  . Intimate partner violence    Fear of current or ex partner: Not on file    Emotionally abused:  Not on file    Physically abused: Not on file    Forced sexual activity: Not on file  Other Topics Concern  . 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 Diabetes Brother      Current Outpatient Medications:  .  aspirin EC 81 MG tablet, Take 81 mg by mouth daily., Disp: , Rfl:  .  atorvastatin (LIPITOR) 10 MG tablet, Take 10 mg by mouth at bedtime. , Disp: , Rfl:  .  Bioflavonoid Products (ESTER C PO), Take 500 mg by mouth daily. , Disp: , Rfl:  .  Calcium Carbonate-Vitamin D (CALTRATE 600+D PO), Take 1 tablet by mouth daily., Disp: , Rfl:  .  Cholecalciferol (VITAMIN D3) 1000 units CAPS, Take 1,000 Units by mouth daily.  , Disp: , Rfl:  .  Coenzyme Q10 (COQ10) 100 MG CAPS, Take 100 mg by mouth daily., Disp: , Rfl:  .  Glucosamine-MSM-Hyaluronic Acd (JOINT HEALTH PO), Take 1 tablet by mouth 2 (two) times a day. , Disp: , Rfl:  .  levothyroxine (SYNTHROID) 100 MCG tablet, Take 100 mcg by mouth daily before breakfast. , Disp: , Rfl:  .  mirtazapine (REMERON) 7.5 MG tablet, Take 7.5 mg by mouth at bedtime., Disp: , Rfl:  .  omeprazole (PRILOSEC) 40 MG capsule, Take 40 mg by mouth daily., Disp: , Rfl:  .  b complex vitamins tablet, Take 1 tablet by mouth daily., Disp: , Rfl:  .  ondansetron (ZOFRAN) 4 MG tablet, Take 1 tablet (4 mg total) by mouth every 8 (eight) hours as needed for nausea or vomiting., Disp: 90 tablet, Rfl: 0  Physical exam:  Vitals:   08/04/18 1010  BP: 108/67  Pulse: (!) 108  Resp: 18  Temp: (!) 97.5 F (36.4 C)  TempSrc: Tympanic  Weight: 99 lb 11.2 oz (45.2 kg)  Height: 5\' 1"  (1.549 m)   Physical Exam Constitutional:      General: She is not in acute distress.    Comments: Thin frail elderly woman sitting in a wheelchair.  Appears in no acute distress  HENT:     Head: Normocephalic and atraumatic.  Eyes:     Pupils: Pupils are equal, round, and reactive to light.  Neck:     Musculoskeletal: Normal range of motion.  Cardiovascular:     Rate and Rhythm: Normal rate and regular rhythm.     Heart sounds: Normal heart sounds.  Pulmonary:     Effort: Pulmonary effort is normal.     Breath sounds: Normal breath sounds.  Abdominal:     General: Bowel sounds are normal.     Palpations: Abdomen is soft.  Skin:    General: Skin is warm and dry.  Neurological:     Mental Status: She is alert and oriented to person, place, and time.  Psychiatric:        Mood and Affect: Mood normal.        Behavior: Behavior normal.      CMP Latest Ref Rng & Units 08/04/2018  Glucose 70 - 99 mg/dL 147(H)  BUN 8 - 23 mg/dL 19  Creatinine 0.44 - 1.00 mg/dL 0.92  Sodium 135 - 145 mmol/L 138   Potassium 3.5 - 5.1 mmol/L 4.0  Chloride 98 - 111 mmol/L 101  CO2 22 - 32 mmol/L 27  Calcium 8.9 - 10.3  mg/dL 9.5  Total Protein 6.5 - 8.1 g/dL 7.4  Total Bilirubin 0.3 - 1.2 mg/dL 0.5  Alkaline Phos 38 - 126 U/L 77  AST 15 - 41 U/L 31  ALT 0 - 44 U/L 18   CBC Latest Ref Rng & Units 08/04/2018  WBC 4.0 - 10.5 K/uL 7.4  Hemoglobin 12.0 - 15.0 g/dL 11.5(L)  Hematocrit 36.0 - 46.0 % 35.7(L)  Platelets 150 - 400 K/uL 269       No results found.   Assessment and plan- Patient is a 85 y.o. female with recurrent ovarian carcinoma, currently s/p cycle 1 of weekly carbo (AUC 2) returns to clinic for consideration of cycle 2. Given increased frailty she is currently receiving weekly, dose reduced, single agent carbo. Previously discussed adding taxol for cycle 2 however, today patient wishes to proceed with single agent carbo. She'd like to do same for cycle 3 and will consider adding taxol for cycle 4.   Overall she appears to have tolerated cycle 1 well without significant side effects. Labs today reviewed and acceptable to proceed with treatment.   Given change in her diarrhea to more mucous like, I'll check stool studies today. Consider imodium if negative. On remeron 7.5 for sleep and appetite per pcp. Start zofran 4mg  every 8 hours as needed for nausea.   She is meeting with Billey Chang, NP from palliative care to discuss her ongoing symptoms today.  She is also scheduled to meet with dietician today as well. Feel she would benefit from being followed by palliative care.   Follow up with Dr. Janese Banks in one week for labs (CBC, CMP, CA 125) and consideration of cycle 3 of weekly carboplatin.    Total face to face encounter time for this patient visit was 20 min. >50% of the time was  spent in counseling and coordination of care.     Visit Diagnosis 1. Ovarian cancer, unspecified laterality ([place])     Beckey Rutter, DNP, AGNP-C [place] [facility] [phone] (work  cell) [phone] (office)  CC: Dr. Janese Banks

## 2018-08-04 NOTE — Progress Notes (Signed)
Pt trying to eat more and she does not get sick but she gets full quickly. She tried to eat several times during the day. Her daughter bought her orgiene-it the equivalent to bosot but natural. She is out right now but suppuse to get then in this week. She has been drinking 1 a day

## 2018-08-04 NOTE — Progress Notes (Signed)
Nutrition Assessment   Reason for Assessment:   Referral for poor appetite.   ASSESSMENT:   85 year old female with recurrent ovarian cancer with ascites.  Past medical history of CAD, CKD, hyperthyroidism, shingles.   Spoke with patient in infusion this am.  Patient reports that appetite is poor.  Denies actively vomiting but at times has quesy feeling.  Reports gets full quickly.  Lives at assisted living facility.  Reports that breakfast is cereal or egg with toast. Lunch is usually a hot meal that is served and supper is lighter.  Reports that she has been drinking orgain shakes recently but is currently out.  Has also drank ensure/boost as well.  Reports that she drinks usually 1/2 of shake with breakfast and the other 1/2 later on during the day.     Nutrition Focused Physical Exam: deferred as in infusion   Medications: calcium carbonate/vit D, remeron, prilosec, b complex vitamins   Labs: glucose 145   Anthropometrics:   Height: 61 inches Weight: 99 lb 11.2 oz UBW: 120-125 lb per patient.  Noted 122 lb last June 2019 BMI: 18  19% weight loss in the last year   Estimated Energy Needs  Kcals: 1380-1600 calories Protein: 69-80 g Fluid: 1.6 L   NUTRITION DIAGNOSIS: Inadequate oral intake related to cancer and cancer related treatment side effects as evidenced by 19% weight loss in the last year   INTERVENTION:  Discussed strategies to increase calories and protein with patient.  Provided samples of other plant based oral nutrition supplements.  Encouraged highest calorie shake she likes at least BID Encouraged small frequent meals, eating q 2 hours   MONITORING, EVALUATION, GOAL: Patient will increase calories and protein to prevent further weight loss   Next Visit: Monday, June 29  Marquita Lias B. Zenia Resides, [place], [place] Registered Dietitian [phone] (pager)

## 2018-08-04 NOTE — Progress Notes (Signed)
[name]  [phone] [phone]   Name: Natalie Stone Date: 08/04/2018 MRN: [id number]  DOB: 01/29/1927  Patient Care Team: Rusty Aus, MD as PCP - General (Internal Medicine) Clent Jacks, RN as Registered Nurse    REASON FOR CONSULTATION: Palliative Care consult requested for this 85 y.o. female with multiple medical problems including recurrent ovarian cancer status post neoadjuvant chemotherapy and surgery in 2013.  Patient has been followed by Gyn Onc.  She was hospitalized at [facility] 07/05/2018-07/13/2018 with abdominal pain and melena and was found to have hydronephrosis of the left kidney and increasing CA-125 with CT demonstrating carcinomatosis and a pelvic mass concerning for recurrence.  Patient is status post left ureteral stent on 5/20.  She has been referred back to outpatient medical oncology/GYN oncology.  Patient is also been referred to palliative care for support.  SOCIAL HISTORY:     reports that she has never smoked. She has never used smokeless tobacco. She reports that she does not drink alcohol or use drugs.  Patient is recently widowed having lost her husband in March 2020.  She lives at home with her daughter.  She also has a granddaughter who lives nearby and is involved in her care.  Her granddaughter is an OB/GYN.    Patient formally worked as a Pharmacist, hospital.  She lived in the [place] area for over 40 years and moved here several years ago to be near family.  Patient lives in the independent living section of [place].  ADVANCE DIRECTIVES:  Not on file  CODE STATUS:   PAST MEDICAL HISTORY: Past Medical History:  Diagnosis Date  . CAD (coronary artery disease)   . Chronic kidney disease    urinary incontinence  . Hyperthyroidism   . Hypothyroidism (acquired)   . Ovarian cancer ([place]) 2014  . Shingles   . Third degree AV block ([place]) 10/09/2015    PAST SURGICAL HISTORY:  Past  Surgical History:  Procedure Laterality Date  . ABDOMINAL HYSTERECTOMY    . BLADDER REPAIR    . CORONARY ANGIOPLASTY WITH STENT PLACEMENT  2001   Dr. Isaiah Blakes in [place]  . CYSTOURETHROSCOPY, WITH INSERTION OF INDWELLING URETERAL STENT (EG,GIBBONS OR DOUBLE-J TYPE) (Left Ureter Left   . PACEMAKER INSERTION N/A 10/11/2015   Procedure: INSERTION PACEMAKER;  Surgeon: [name], MD;  Location: [facility] ORS;  Service: Cardiovascular;  Laterality: N/A;  . PARTIAL HYSTERECTOMY    . PORTA CATH INSERTION N/A 07/24/2018   Procedure: PORTA CATH INSERTION;  Surgeon: Algernon Huxley, MD;  Location: [facility];  Service: Cardiovascular;  Laterality: N/A;    HEMATOLOGY/ONCOLOGY HISTORY:  Oncology History Overview Note  # NOV 2013-RIGHT OVARIAN CANCER-HIGH GRADE SEROUS CA 125-996 STAGE III [[facility]Stephanie [facility]Jan 2014- neo-adj chemo x3 M;April 2014 TAH & BSO ; chemo x 5M [finished C-6; July 2014]. Ca 125 -13; N  # GENETIC TESTING-"OncoGene Dx"-NEG  # May-June 2020- Recurrent adenocarcinoma [ascites s/p para; [facility]] ca-[phone].   # June 8th 2020- Carbo AUC 2  # may 2020- Left hydro-ureteral -n ephrosis [s/p stenting]; diarrhea-? malignancy s/p sigmoidscopy  [[facility]]  CT- showed severe left hydro ureteral nephrosis.  Sigmoid wall thickening differential includes carcinomatosis and colitis.  She did had a repeat scan on 07/09/2018 which showed interval increase in ascites mostly within the pelvis with associated enhancement of the peritoneum suggesting carcinomatosis.  Redemonstration of sigmoid wall thickening which may represent colitis versus carcinomatosis.  Delayed  left nephrogram with severe left hydro-ureteronephrosis similar to prior.  Given findings of colitis as well as ongoing symptoms of melena and diarrhea patient underwent EGD as well as flexible sigmoidoscopy which showed Schatzki's ring and diverticulosis but no evidence of tumor.  Her symptoms are possibly  secondary to carcinomatosis.  She also had other stool studies done including C. difficile and stool cultures which were negative.Patient did undergo a diagnostic tap of her ascites on 07/09/2018 which was consistent with adenocarcinoma  S/p pacemaker [Dr.Parascholes] --------------------------------------   DIAGNOSIS: recurrent ovarain cancer  STAGE:    IV  ;GOALS: pallaitive  CURRENT/MOST RECENT THERAPY: weekly carbo-Taxol     Malignant neoplasm of ovary ([place])  11/23/2015 Initial Diagnosis   Ovarian cancer, unspecified laterality ([place])   07/28/2018 -  Chemotherapy   The patient had palonosetron (ALOXI) injection 0.25 mg, 0.25 mg, Intravenous,  Once, 1 of 6 cycles Administration: 0.25 mg (07/28/2018), 0.25 mg (08/04/2018) CARBOplatin (PARAPLATIN) 110 mg in sodium chloride 0.9 % 100 mL chemo infusion, 110 mg (100 % of original dose 113.4 mg), Intravenous,  Once, 1 of 6 cycles Dose modification:   (original dose 113.4 mg, Cycle 1) Administration: 110 mg (07/28/2018), 110 mg (08/04/2018) PACLitaxel (TAXOL) 84 mg in sodium chloride 0.9 % 250 mL chemo infusion (> 34m/m2), 60 mg/m2 = 84 mg (100 % of original dose 60 mg/m2), Intravenous,  Once, 1 of 6 cycles Dose modification: 60 mg/m2 (original dose 60 mg/m2, Cycle 1, Reason: Other (see comments), Comment: weekly) fosaprepitant (EMEND) 150 mg, dexamethasone (DECADRON) 12 mg in sodium chloride 0.9 % 145 mL IVPB, , Intravenous,  Once, 1 of 6 cycles Administration:  (07/28/2018),  (08/04/2018)  for chemotherapy treatment.      ALLERGIES:  is allergic to penicillins and ativan [lorazepam].  MEDICATIONS:  Current Outpatient Medications  Medication Sig Dispense Refill  . aspirin EC 81 MG tablet Take 81 mg by mouth daily.    .Marland Kitchenatorvastatin (LIPITOR) 10 MG tablet Take 10 mg by mouth at bedtime.     .Marland Kitchenb complex vitamins tablet Take 1 tablet by mouth daily.    .Marland KitchenBioflavonoid Products (ESTER C PO) Take 500 mg by mouth daily.     . Calcium  Carbonate-Vitamin D (CALTRATE 600+D PO) Take 1 tablet by mouth daily.    . Cholecalciferol (VITAMIN D3) 1000 units CAPS Take 1,000 Units by mouth daily.     . Coenzyme Q10 (COQ10) 100 MG CAPS Take 100 mg by mouth daily.    . Glucosamine-MSM-Hyaluronic Acd (JOINT HEALTH PO) Take 1 tablet by mouth 2 (two) times a day.     . levothyroxine (SYNTHROID) 100 MCG tablet Take 100 mcg by mouth daily before breakfast.     . mirtazapine (REMERON) 7.5 MG tablet Take 7.5 mg by mouth at bedtime.    .Marland Kitchenomeprazole (PRILOSEC) 40 MG capsule Take 40 mg by mouth daily.     No current facility-administered medications for this visit.     VITAL SIGNS: There were no vitals taken for this visit. There were no vitals filed for this visit.  Estimated body mass index is 18.84 kg/m as calculated from the following:   Height as of an earlier encounter on 08/04/18: 5' 1"  (1.549 m).   Weight as of an earlier encounter on 08/04/18: 99 lb 11.2 oz (45.2 kg).  LABS: CBC:    Component Value Date/Time   WBC 7.4 08/04/2018 1001   HGB 11.5 (L) 08/04/2018 1001   HCT 35.7 (L) 08/04/2018 1001  PLT 269 08/04/2018 1001   MCV 93.0 08/04/2018 1001   NEUTROABS 4.7 08/04/2018 1001   LYMPHSABS 1.7 08/04/2018 1001   MONOABS 0.7 08/04/2018 1001   EOSABS 0.3 08/04/2018 1001   BASOSABS 0.1 08/04/2018 1001   Comprehensive Metabolic Panel:    Component Value Date/Time   NA 138 08/04/2018 1001   K 4.0 08/04/2018 1001   CL 101 08/04/2018 1001   CO2 27 08/04/2018 1001   BUN 19 08/04/2018 1001   CREATININE 0.92 08/04/2018 1001   GLUCOSE 147 (H) 08/04/2018 1001   CALCIUM 9.5 08/04/2018 1001   AST 31 08/04/2018 1001   ALT 18 08/04/2018 1001   ALKPHOS 77 08/04/2018 1001   BILITOT 0.5 08/04/2018 1001   PROT 7.4 08/04/2018 1001   ALBUMIN 3.7 08/04/2018 1001    RADIOGRAPHIC STUDIES: No results found.  PERFORMANCE STATUS (ECOG) : 1 - Symptomatic but completely ambulatory  Review of Systems Unless otherwise noted, a complete  review of systems is negative.  Physical Exam General: NAD, frail appearing, thin Pulmonary: unlabored Extremities: no edema Skin: no rashes Neurological: Weakness but otherwise nonfocal  IMPRESSION: I met with patient today following her [facility] appointment.  Patient was seen in infusion.    Patient reports that she has been doing better over the last 2 weeks.  She says she feels stronger and has been more ambulatory around the house.  Her oral intake remains poor.  I note mild weight loss over the past 2 weeks.  Patient is down to 99 pounds from 102 pounds on 07/28/2018.  Patient saw Desmond Lope today.  I encouraged patient to increase her oral supplements to twice daily (she is currently only drinking 1 a day.)  She continues to take mirtazapine 7.5 mg daily and finds it is helpful with insomnia.  Weight gain is generally associated with lower doses of mirtazapine.  Could consider a trial of steroids if necessary.  Would recommend checking a TSH as this has not been done in our system since 2017, although presumably this is followed by her PCP.   I called and spoke with patient's daughter by phone.  She also feels the patient is doing reasonably well.  She had no questions or concerns.  We will follow  PLAN: -Continue current scope of treatment. -Increase oral supplements to twice daily -Will continue to meet with patient to discuss goals and provide support.  Will need to clarify ACP/CODE STATUS.   Patient expressed understanding and was in agreement with this plan. She also understands that She can call the clinic at any time with any questions, concerns, or complaints.     Time Total: 15 minutes  Visit consisted of counseling and education dealing with the complex and emotionally intense issues of symptom management and palliative care in the setting of serious and potentially life-threatening illness.Greater than 50%  of this time was spent counseling and coordinating care related to the  above assessment and plan.  Signed by: Altha Harm, PhD, NP-C [phone] (Work Cell)

## 2018-08-05 ENCOUNTER — Other Ambulatory Visit: Payer: Self-pay

## 2018-08-05 DIAGNOSIS — R188 Other ascites: Secondary | ICD-10-CM | POA: Diagnosis not present

## 2018-08-05 DIAGNOSIS — Z5111 Encounter for antineoplastic chemotherapy: Secondary | ICD-10-CM | POA: Diagnosis not present

## 2018-08-05 DIAGNOSIS — K5790 Diverticulosis of intestine, part unspecified, without perforation or abscess without bleeding: Secondary | ICD-10-CM | POA: Diagnosis not present

## 2018-08-05 DIAGNOSIS — R000 Tachycardia, unspecified: Secondary | ICD-10-CM | POA: Diagnosis not present

## 2018-08-05 DIAGNOSIS — R197 Diarrhea, unspecified: Secondary | ICD-10-CM | POA: Diagnosis not present

## 2018-08-05 DIAGNOSIS — C561 Malignant neoplasm of right ovary: Secondary | ICD-10-CM | POA: Diagnosis not present

## 2018-08-05 DIAGNOSIS — R630 Anorexia: Secondary | ICD-10-CM | POA: Diagnosis not present

## 2018-08-05 DIAGNOSIS — C569 Malignant neoplasm of unspecified ovary: Secondary | ICD-10-CM

## 2018-08-05 DIAGNOSIS — N3000 Acute cystitis without hematuria: Secondary | ICD-10-CM | POA: Diagnosis not present

## 2018-08-05 DIAGNOSIS — K222 Esophageal obstruction: Secondary | ICD-10-CM | POA: Diagnosis not present

## 2018-08-05 LAB — GASTROINTESTINAL PANEL BY PCR, STOOL (REPLACES STOOL CULTURE)

## 2018-08-05 LAB — C DIFFICILE QUICK SCREEN W PCR REFLEX
C Diff antigen: NEGATIVE
C Diff interpretation: NOT DETECTED
C Diff toxin: NEGATIVE

## 2018-08-05 LAB — CA 125: Cancer Antigen (CA) 125: 1326 U/mL — ABNORMAL HIGH (ref 0.0–38.1)

## 2018-08-06 DIAGNOSIS — Z90710 Acquired absence of both cervix and uterus: Secondary | ICD-10-CM | POA: Diagnosis not present

## 2018-08-06 DIAGNOSIS — R530 Neoplastic (malignant) related fatigue: Secondary | ICD-10-CM | POA: Diagnosis not present

## 2018-08-06 DIAGNOSIS — N1330 Unspecified hydronephrosis: Secondary | ICD-10-CM | POA: Diagnosis not present

## 2018-08-06 DIAGNOSIS — Z950 Presence of cardiac pacemaker: Secondary | ICD-10-CM | POA: Diagnosis not present

## 2018-08-06 DIAGNOSIS — Z79899 Other long term (current) drug therapy: Secondary | ICD-10-CM | POA: Diagnosis not present

## 2018-08-06 DIAGNOSIS — C561 Malignant neoplasm of right ovary: Secondary | ICD-10-CM | POA: Diagnosis not present

## 2018-08-06 DIAGNOSIS — Z7982 Long term (current) use of aspirin: Secondary | ICD-10-CM | POA: Diagnosis not present

## 2018-08-08 ENCOUNTER — Other Ambulatory Visit: Payer: Self-pay

## 2018-08-08 DIAGNOSIS — Z90710 Acquired absence of both cervix and uterus: Secondary | ICD-10-CM | POA: Diagnosis not present

## 2018-08-08 DIAGNOSIS — Z950 Presence of cardiac pacemaker: Secondary | ICD-10-CM | POA: Diagnosis not present

## 2018-08-08 DIAGNOSIS — R530 Neoplastic (malignant) related fatigue: Secondary | ICD-10-CM | POA: Diagnosis not present

## 2018-08-08 DIAGNOSIS — Z79899 Other long term (current) drug therapy: Secondary | ICD-10-CM | POA: Diagnosis not present

## 2018-08-08 DIAGNOSIS — C561 Malignant neoplasm of right ovary: Secondary | ICD-10-CM | POA: Diagnosis not present

## 2018-08-08 DIAGNOSIS — Z7982 Long term (current) use of aspirin: Secondary | ICD-10-CM | POA: Diagnosis not present

## 2018-08-08 DIAGNOSIS — N1330 Unspecified hydronephrosis: Secondary | ICD-10-CM | POA: Diagnosis not present

## 2018-08-09 ENCOUNTER — Other Ambulatory Visit: Payer: Self-pay | Admitting: *Deleted

## 2018-08-09 ENCOUNTER — Other Ambulatory Visit: Payer: Self-pay | Admitting: Oncology

## 2018-08-09 DIAGNOSIS — C569 Malignant neoplasm of unspecified ovary: Secondary | ICD-10-CM

## 2018-08-09 MED ORDER — CIPROFLOXACIN HCL 250 MG PO TABS: 250.0000 mg | ORAL_TABLET | Freq: Two times a day (BID) | ORAL | 0 refills | Status: DC

## 2018-08-11 ENCOUNTER — Encounter: Payer: Self-pay | Admitting: Oncology

## 2018-08-11 ENCOUNTER — Other Ambulatory Visit: Payer: Self-pay

## 2018-08-11 ENCOUNTER — Inpatient Hospital Stay (HOSPITAL_BASED_OUTPATIENT_CLINIC_OR_DEPARTMENT_OTHER): Payer: Medicare HMO | Admitting: Oncology

## 2018-08-11 ENCOUNTER — Inpatient Hospital Stay: Payer: Medicare HMO

## 2018-08-11 VITALS — BP 116/69 | HR 110 | Temp 98.6°F | Resp 16 | Ht 61.0 in | Wt 99.8 lb

## 2018-08-11 DIAGNOSIS — N3000 Acute cystitis without hematuria: Secondary | ICD-10-CM

## 2018-08-11 DIAGNOSIS — K5790 Diverticulosis of intestine, part unspecified, without perforation or abscess without bleeding: Secondary | ICD-10-CM | POA: Diagnosis not present

## 2018-08-11 DIAGNOSIS — R197 Diarrhea, unspecified: Secondary | ICD-10-CM

## 2018-08-11 DIAGNOSIS — R000 Tachycardia, unspecified: Secondary | ICD-10-CM | POA: Diagnosis not present

## 2018-08-11 DIAGNOSIS — R630 Anorexia: Secondary | ICD-10-CM | POA: Diagnosis not present

## 2018-08-11 DIAGNOSIS — Z5111 Encounter for antineoplastic chemotherapy: Secondary | ICD-10-CM | POA: Diagnosis not present

## 2018-08-11 DIAGNOSIS — C561 Malignant neoplasm of right ovary: Secondary | ICD-10-CM

## 2018-08-11 DIAGNOSIS — R188 Other ascites: Secondary | ICD-10-CM

## 2018-08-11 DIAGNOSIS — K222 Esophageal obstruction: Secondary | ICD-10-CM | POA: Diagnosis not present

## 2018-08-11 DIAGNOSIS — C569 Malignant neoplasm of unspecified ovary: Secondary | ICD-10-CM

## 2018-08-11 LAB — CBC WITH DIFFERENTIAL/PLATELET
Abs Immature Granulocytes: 0.03 10*3/uL (ref 0.00–0.07)
Basophils Absolute: 0 10*3/uL (ref 0.0–0.1)
Basophils Relative: 0 %
Eosinophils Absolute: 0.2 10*3/uL (ref 0.0–0.5)
Eosinophils Relative: 2 %
HCT: 34.3 % — ABNORMAL LOW (ref 36.0–46.0)
Hemoglobin: 11 g/dL — ABNORMAL LOW (ref 12.0–15.0)
Immature Granulocytes: 0 %
Lymphocytes Relative: 19 %
Lymphs Abs: 1.7 10*3/uL (ref 0.7–4.0)
MCH: 30.1 pg (ref 26.0–34.0)
MCHC: 32.1 g/dL (ref 30.0–36.0)
MCV: 94 fL (ref 80.0–100.0)
Monocytes Absolute: 0.7 10*3/uL (ref 0.1–1.0)
Monocytes Relative: 8 %
Neutro Abs: 6.4 10*3/uL (ref 1.7–7.7)
Neutrophils Relative %: 71 %
Platelets: 244 10*3/uL (ref 150–400)
RBC: 3.65 MIL/uL — ABNORMAL LOW (ref 3.87–5.11)
RDW: 13.6 % (ref 11.5–15.5)
WBC: 9 10*3/uL (ref 4.0–10.5)
nRBC: 0 % (ref 0.0–0.2)

## 2018-08-11 LAB — COMPREHENSIVE METABOLIC PANEL
ALT: 15 U/L (ref 0–44)
AST: 23 U/L (ref 15–41)
Albumin: 3.5 g/dL (ref 3.5–5.0)
Alkaline Phosphatase: 70 U/L (ref 38–126)
Anion gap: 9 (ref 5–15)
BUN: 23 mg/dL (ref 8–23)
CO2: 27 mmol/L (ref 22–32)
Calcium: 9 mg/dL (ref 8.9–10.3)
Chloride: 103 mmol/L (ref 98–111)
Creatinine, Ser: 1.06 mg/dL — ABNORMAL HIGH (ref 0.44–1.00)
GFR calc Af Amer: 54 mL/min — ABNORMAL LOW (ref >60)
GFR calc non Af Amer: 47 mL/min — ABNORMAL LOW (ref >60)
Glucose, Bld: 144 mg/dL — ABNORMAL HIGH (ref 70–99)
Potassium: 3.8 mmol/L (ref 3.5–5.1)
Sodium: 139 mmol/L (ref 135–145)
Total Bilirubin: 0.4 mg/dL (ref 0.3–1.2)
Total Protein: 7.3 g/dL (ref 6.5–8.1)

## 2018-08-11 MED ORDER — HEPARIN SOD (PORK) LOCK FLUSH 100 UNIT/ML IV SOLN
500.0000 [IU] | Freq: Once | INTRAVENOUS | Status: AC
  Administered 2018-08-11: 11:00:00 500 [IU] via INTRAVENOUS
  Filled 2018-08-11: qty 5

## 2018-08-11 MED ORDER — SODIUM CHLORIDE 0.9 % IV SOLN
110.0000 mg | Freq: Once | INTRAVENOUS | Status: AC
  Administered 2018-08-11: 110 mg via INTRAVENOUS
  Filled 2018-08-11: qty 11

## 2018-08-11 MED ORDER — SODIUM CHLORIDE 0.9 % IV SOLN
Freq: Once | INTRAVENOUS | Status: AC
  Administered 2018-08-11: 10:00:00 via INTRAVENOUS
  Filled 2018-08-11: qty 250

## 2018-08-11 MED ORDER — FAMOTIDINE IN NACL 20-0.9 MG/50ML-% IV SOLN: 20.0000 mg | Freq: Once | INTRAVENOUS | Status: DC

## 2018-08-11 MED ORDER — DIPHENHYDRAMINE HCL 50 MG/ML IJ SOLN: 12.5000 mg | Freq: Once | INTRAMUSCULAR | Status: DC

## 2018-08-11 MED ORDER — SODIUM CHLORIDE 0.9% FLUSH
10.0000 mL | Freq: Once | INTRAVENOUS | Status: AC
  Administered 2018-08-11: 10 mL via INTRAVENOUS
  Filled 2018-08-11: qty 10

## 2018-08-11 MED ORDER — SODIUM CHLORIDE 0.9 % IV SOLN
Freq: Once | INTRAVENOUS | Status: AC
  Administered 2018-08-11: 10:00:00 via INTRAVENOUS
  Filled 2018-08-11: qty 5

## 2018-08-11 MED ORDER — PALONOSETRON HCL INJECTION 0.25 MG/5ML
0.2500 mg | Freq: Once | INTRAVENOUS | Status: AC
  Administered 2018-08-11: 0.25 mg via INTRAVENOUS
  Filled 2018-08-11: qty 5

## 2018-08-11 NOTE — Progress Notes (Signed)
Hematology/Oncology Consult note [facility]  Telephone:([phone] [phone]  Patient Care Team: Rusty Aus, MD as PCP - General (Internal Medicine) Clent Jacks, RN as Registered Nurse   Name of the patient: Natalie Stone  [id number]  [birth year]   Date of visit: 08/11/18  Diagnosis-recurrent ovarian cancer  Chief complaint/ Reason for visit-on treatment assessment prior to cycle 3 of weekly carboplatin chemotherapy  Heme/Onc history: Patient is a 85 year old female with a past medical history significant for stage III ovarian cancer that was treated in [place] and 2013.  She underwent neoadjuvant chemotherapy followed by surgery with Dr. Beverly Milch followed by adjuvant chemotherapy.  Genetic testing was negative.  She then established care at [facility] in 2017.  More recently patient was found to have a consistently increasing Ca1 25 level from 42.6 in December 20 18-781 in February 2020.  Most recent Ca1 25 was 02/21/2005 on 07/06/2018.  Scans were however negative for any recurrence.  Patient had CT abdomen and pelvis with contrast done on 07/06/2018 which showed severe left hydro ureteral nephrosis.  Sigmoid wall thickening differential includes carcinomatosis and colitis.  She did had a repeat scan on 07/09/2018 which showed interval increase in ascites mostly within the pelvis with associated enhancement of the peritoneum suggesting carcinomatosis.  Redemonstration of sigmoid wall thickening which may represent colitis versus carcinomatosis.  Delayed left nephrogram with severe left hydro-ureteronephrosis similar to prior.  Given findings of colitis as well as ongoing symptoms of melena and diarrhea patient underwent EGD as well as flexible sigmoidoscopy which showed Schatzki's ring and diverticulosis but no evidence of tumor.  Her symptoms are possibly secondary to carcinomatosis.  She also had other stool studies done including C. difficile and stool  cultures which were negative.Patient did undergo a diagnostic tap of her ascites on 07/09/2018 which was consistent with adenocarcinoma  Plan was to do weekly carbotaxol chemotherapy with carboplatin only for the first 3 weeks to assess patient tolerance.  Cycle 1 was started on 07/28/2018.  Interval history-patient's daughter had called over the weekend stating that patient was having some symptoms of pain feeling and burning urination.  She was started on ciprofloxacin 250 mg twice daily by me.  Reports some improvement in urinary symptoms.  She still has problems with her bowel movements and occasionally has stool with mucus which is sometimes watery  ECOG PS- 2 Pain scale- 0 Opioid associated constipation- no  Review of systems- Review of Systems  Constitutional: Positive for malaise/fatigue and weight loss. Negative for chills and fever.  HENT: Negative for congestion, ear discharge and nosebleeds.   Eyes: Negative for blurred vision.  Respiratory: Negative for cough, hemoptysis, sputum production, shortness of breath and wheezing.   Cardiovascular: Negative for chest pain, palpitations, orthopnea and claudication.  Gastrointestinal: Positive for diarrhea. Negative for abdominal pain, blood in stool, constipation, heartburn, melena, nausea and vomiting.  Genitourinary: Negative for dysuria, flank pain, frequency, hematuria and urgency.  Musculoskeletal: Negative for back pain, joint pain and myalgias.  Skin: Negative for rash.  Neurological: Negative for dizziness, tingling, focal weakness, seizures, weakness and headaches.  Endo/Heme/Allergies: Does not bruise/bleed easily.  Psychiatric/Behavioral: Negative for depression and suicidal ideas. The patient does not have insomnia.       Allergies  Allergen Reactions  . Penicillins Anaphylaxis    Has patient had a PCN reaction causing immediate rash, facial/tongue/throat swelling, SOB or lightheadedness with hypotension: Yes Has patient  had a PCN reaction causing severe rash  involving mucus membranes or skin necrosis: No Has patient had a PCN reaction that required hospitalization No Has patient had a PCN reaction occurring within the last 10 years: No If all of the above answers are "NO", then may proceed with Cephalosporin use.  . Ativan [Lorazepam] Other (See Comments)    Made her not remember for many hours.     Past Medical History:  Diagnosis Date  . CAD (coronary artery disease)   . Chronic kidney disease    urinary incontinence  . Hyperthyroidism   . Hypothyroidism (acquired)   . Ovarian cancer ([place]) 2014  . Shingles   . Third degree AV block ([place]) 10/09/2015     Past Surgical History:  Procedure Laterality Date  . ABDOMINAL HYSTERECTOMY    . BLADDER REPAIR    . CORONARY ANGIOPLASTY WITH STENT PLACEMENT  2001   Dr. Isaiah Blakes in [place]  . CYSTOURETHROSCOPY, WITH INSERTION OF INDWELLING URETERAL STENT (EG,GIBBONS OR DOUBLE-J TYPE) (Left Ureter Left   . PACEMAKER INSERTION N/A 10/11/2015   Procedure: INSERTION PACEMAKER;  Surgeon: [name], MD;  Location: [facility] ORS;  Service: Cardiovascular;  Laterality: N/A;  . PARTIAL HYSTERECTOMY    . PORTA CATH INSERTION N/A 07/24/2018   Procedure: PORTA CATH INSERTION;  Surgeon: Algernon Huxley, MD;  Location: [facility];  Service: Cardiovascular;  Laterality: N/A;    Social History   Socioeconomic History  . Marital status: Widowed    Spouse name: Not on file  . Number of children: Not on file  . Years of education: Not on file  . Highest education level: Not on file  Occupational History  . Not on file  Social Needs  . Financial resource strain: Not on file  . Food insecurity    Worry: Not on file    Inability: Not on file  . Transportation needs    Medical: Not on file    Non-medical: Not on file  Tobacco Use  . Smoking status: Never Smoker  . Smokeless tobacco: Never Used  Substance and Sexual Activity  . Alcohol use: No  . Drug  use: No  . Sexual activity: Never  Lifestyle  . Physical activity    Days per week: Not on file    Minutes per session: Not on file  . Stress: Not on file  Relationships  . Social Herbalist on phone: Not on file    Gets together: Not on file    Attends religious service: Not on file    Active member of club or organization: Not on file    Attends meetings of clubs or organizations: Not on file    Relationship status: Not on file  . Intimate partner violence    Fear of current or ex partner: Not on file    Emotionally abused: Not on file    Physically abused: Not on file    Forced sexual activity: Not on file  Other Topics Concern  . 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 Diabetes Brother      Current Outpatient Medications:  .  aspirin EC 81 MG tablet, Take 81 mg by mouth daily., Disp: , Rfl:  .  atorvastatin (LIPITOR) 10 MG tablet, Take 10 mg by mouth at bedtime. , Disp: , Rfl:  .  Bioflavonoid Products (ESTER C PO),  Take 500 mg by mouth daily. , Disp: , Rfl:  .  Calcium Carbonate-Vitamin D (CALTRATE 600+D PO), Take 1 tablet by mouth daily., Disp: , Rfl:  .  Cholecalciferol (VITAMIN D3) 1000 units CAPS, Take 1,000 Units by mouth daily. , Disp: , Rfl:  .  ciprofloxacin (CIPRO) 250 MG tablet, Take 1 tablet (250 mg total) by mouth 2 (two) times daily., Disp: 14 tablet, Rfl: 0 .  Coenzyme Q10 (COQ10) 100 MG CAPS, Take 100 mg by mouth daily., Disp: , Rfl:  .  Glucosamine-MSM-Hyaluronic Acd (JOINT HEALTH PO), Take 1 tablet by mouth 2 (two) times a day. , Disp: , Rfl:  .  levothyroxine (SYNTHROID) 100 MCG tablet, Take 100 mcg by mouth daily before breakfast. , Disp: , Rfl:  .  mirtazapine (REMERON) 7.5 MG tablet, Take 7.5 mg by mouth at bedtime., Disp: , Rfl:  .  b complex vitamins tablet, Take 1 tablet by mouth daily., Disp: , Rfl:  .   omeprazole (PRILOSEC) 40 MG capsule, Take 40 mg by mouth daily., Disp: , Rfl:  .  ondansetron (ZOFRAN) 4 MG tablet, Take 1 tablet (4 mg total) by mouth every 8 (eight) hours as needed for nausea or vomiting. (Patient not taking: Reported on 08/11/2018), Disp: 90 tablet, Rfl: 0 No current facility-administered medications for this visit.   Facility-Administered Medications Ordered in Other Visits:  .  0.9 %  sodium chloride infusion, , Intravenous, Once, Brahmanday, Govinda R, MD .  0.9 %  sodium chloride infusion, , Intravenous, Once, Sindy Guadeloupe, MD, Last Rate: 999 mL/hr at 08/11/18 0955 .  CARBOplatin (PARAPLATIN) 110 mg in sodium chloride 0.9 % 100 mL chemo infusion, 110 mg, Intravenous, Once, Brahmanday, Lenetta Quaker R, MD .  fosaprepitant (EMEND) 150 mg, dexamethasone (DECADRON) 12 mg in sodium chloride 0.9 % 145 mL IVPB, , Intravenous, Once, Brahmanday, Govinda R, MD .  heparin lock flush 100 unit/mL, 500 Units, Intravenous, Once, Sindy Guadeloupe, MD .  palonosetron (ALOXI) injection 0.25 mg, 0.25 mg, Intravenous, Once, Cammie Sickle, MD  Physical exam:  Vitals:   08/11/18 0900  BP: 116/69  Pulse: (!) 110  Resp: 16  Temp: 98.6 F (37 C)  TempSrc: Tympanic  Weight: 99 lb 12.8 oz (45.3 kg)  Height: 5\' 1"  (1.549 m)   Physical Exam Constitutional:      Comments: Thin elderly frail woman in no acute distress  HENT:     Head: Normocephalic and atraumatic.  Eyes:     Pupils: Pupils are equal, round, and reactive to light.  Neck:     Musculoskeletal: Normal range of motion.  Cardiovascular:     Rate and Rhythm: Regular rhythm. Tachycardia present.     Heart sounds: Normal heart sounds.  Pulmonary:     Effort: Pulmonary effort is normal.     Breath sounds: Normal breath sounds.  Abdominal:     General: Bowel sounds are normal.     Palpations: Abdomen is soft.  Skin:    General: Skin is warm and dry.  Neurological:     Mental Status: She is alert and oriented to person,  place, and time.      CMP Latest Ref Rng & Units 08/11/2018  Glucose 70 - 99 mg/dL 144(H)  BUN 8 - 23 mg/dL 23  Creatinine 0.44 - 1.00 mg/dL 1.06(H)  Sodium 135 - 145 mmol/L 139  Potassium 3.5 - 5.1 mmol/L 3.8  Chloride 98 - 111 mmol/L 103  CO2 22 - 32 mmol/L 27  Calcium 8.9 - 10.3 mg/dL 9.0  Total Protein 6.5 - 8.1 g/dL 7.3  Total Bilirubin 0.3 - 1.2 mg/dL 0.4  Alkaline Phos 38 - 126 U/L 70  AST 15 - 41 U/L 23  ALT 0 - 44 U/L 15   CBC Latest Ref Rng & Units 08/11/2018  WBC 4.0 - 10.5 K/uL 9.0  Hemoglobin 12.0 - 15.0 g/dL 11.0(L)  Hematocrit 36.0 - 46.0 % 34.3(L)  Platelets 150 - 400 K/uL 244      Assessment and plan- Patient is a 85 y.o. female with recurrent ovarian carcinoma.  She is here for on treatment assessment prior to cycle 3 of weekly carboplatin chemotherapy  Counts okay to proceed with weekly cycle 3 of carboplatin AUC 2 chemotherapy today.  We discussed today as to when we can restart Taxol and we agreed that we will do that starting next week.  Patient has some tachycardia today and was also started on ciprofloxacin to 50 mg twice daily over the weekend for possible urinary tract infection.  I will give her 1 L of IV fluids today and she will continue with ciprofloxacin for 7 days.  I will hold off on getting a urinalysis at this time if she has symptoms on antibiotics we will check another UA at that time.  Return to clinic in 1 week with port labs: CBC with differential, CMP.  See NP Beckey Rutter for cycle 4 of weekly blood work platinum and Taxol chemotherapy.  Taxol will be given at reduced dose of 60 mg per metered square. Return to clinic in 2 weeks with port labs: CBC with differential, CMP, Ca1 25.  See Dr. Janese Banks.  Gets cycle 5 of weekly carboplatin and Taxol chemotherapy   Visit Diagnosis 1. Encounter for antineoplastic chemotherapy   2. Malignant neoplasm of ovary, unspecified laterality ([place])   3. Acute cystitis without hematuria      Dr. Randa Evens,  MD, MPH [facility] [phone] 08/11/2018 9:56 AM

## 2018-08-11 NOTE — Progress Notes (Signed)
HR 110, ok to proceed per MD.  Only Carbo today

## 2018-08-11 NOTE — Progress Notes (Signed)
Pt says that she drank a lot of fluids, she has not ate as much. Felt like she urinated and hade BM so that is ok. No nausea.

## 2018-08-12 LAB — CA 125: Cancer Antigen (CA) 125: 1066 U/mL — ABNORMAL HIGH (ref 0.0–38.1)

## 2018-08-15 ENCOUNTER — Other Ambulatory Visit: Payer: Self-pay

## 2018-08-15 DIAGNOSIS — Z90710 Acquired absence of both cervix and uterus: Secondary | ICD-10-CM | POA: Diagnosis not present

## 2018-08-15 DIAGNOSIS — C561 Malignant neoplasm of right ovary: Secondary | ICD-10-CM | POA: Diagnosis not present

## 2018-08-15 DIAGNOSIS — Z7982 Long term (current) use of aspirin: Secondary | ICD-10-CM | POA: Diagnosis not present

## 2018-08-15 DIAGNOSIS — I2510 Atherosclerotic heart disease of native coronary artery without angina pectoris: Secondary | ICD-10-CM | POA: Diagnosis not present

## 2018-08-15 DIAGNOSIS — Z950 Presence of cardiac pacemaker: Secondary | ICD-10-CM | POA: Diagnosis not present

## 2018-08-15 DIAGNOSIS — R530 Neoplastic (malignant) related fatigue: Secondary | ICD-10-CM | POA: Diagnosis not present

## 2018-08-15 DIAGNOSIS — E782 Mixed hyperlipidemia: Secondary | ICD-10-CM | POA: Diagnosis not present

## 2018-08-15 DIAGNOSIS — Z79899 Other long term (current) drug therapy: Secondary | ICD-10-CM | POA: Diagnosis not present

## 2018-08-15 DIAGNOSIS — N1330 Unspecified hydronephrosis: Secondary | ICD-10-CM | POA: Diagnosis not present

## 2018-08-18 ENCOUNTER — Inpatient Hospital Stay (HOSPITAL_BASED_OUTPATIENT_CLINIC_OR_DEPARTMENT_OTHER): Payer: Medicare HMO | Admitting: Hospice and Palliative Medicine

## 2018-08-18 ENCOUNTER — Other Ambulatory Visit: Payer: Self-pay

## 2018-08-18 ENCOUNTER — Inpatient Hospital Stay: Payer: Medicare HMO

## 2018-08-18 ENCOUNTER — Inpatient Hospital Stay (HOSPITAL_BASED_OUTPATIENT_CLINIC_OR_DEPARTMENT_OTHER): Payer: Medicare HMO | Admitting: Nurse Practitioner

## 2018-08-18 ENCOUNTER — Other Ambulatory Visit: Payer: Self-pay | Admitting: Oncology

## 2018-08-18 ENCOUNTER — Encounter: Payer: Self-pay | Admitting: Nurse Practitioner

## 2018-08-18 VITALS — BP 151/68 | HR 106 | Temp 98.7°F | Resp 18 | Wt 102.3 lb

## 2018-08-18 VITALS — BP 136/74 | HR 85 | Resp 17

## 2018-08-18 DIAGNOSIS — Z515 Encounter for palliative care: Secondary | ICD-10-CM | POA: Diagnosis not present

## 2018-08-18 DIAGNOSIS — C569 Malignant neoplasm of unspecified ovary: Secondary | ICD-10-CM

## 2018-08-18 DIAGNOSIS — Z5111 Encounter for antineoplastic chemotherapy: Secondary | ICD-10-CM

## 2018-08-18 DIAGNOSIS — C561 Malignant neoplasm of right ovary: Secondary | ICD-10-CM | POA: Diagnosis not present

## 2018-08-18 DIAGNOSIS — K222 Esophageal obstruction: Secondary | ICD-10-CM | POA: Diagnosis not present

## 2018-08-18 DIAGNOSIS — K5790 Diverticulosis of intestine, part unspecified, without perforation or abscess without bleeding: Secondary | ICD-10-CM | POA: Diagnosis not present

## 2018-08-18 DIAGNOSIS — R000 Tachycardia, unspecified: Secondary | ICD-10-CM

## 2018-08-18 DIAGNOSIS — Z7982 Long term (current) use of aspirin: Secondary | ICD-10-CM | POA: Diagnosis not present

## 2018-08-18 DIAGNOSIS — Z79899 Other long term (current) drug therapy: Secondary | ICD-10-CM | POA: Diagnosis not present

## 2018-08-18 DIAGNOSIS — R197 Diarrhea, unspecified: Secondary | ICD-10-CM

## 2018-08-18 DIAGNOSIS — R630 Anorexia: Secondary | ICD-10-CM | POA: Diagnosis not present

## 2018-08-18 DIAGNOSIS — Z95828 Presence of other vascular implants and grafts: Secondary | ICD-10-CM

## 2018-08-18 DIAGNOSIS — N3000 Acute cystitis without hematuria: Secondary | ICD-10-CM | POA: Diagnosis not present

## 2018-08-18 DIAGNOSIS — R188 Other ascites: Secondary | ICD-10-CM

## 2018-08-18 LAB — CBC WITH DIFFERENTIAL/PLATELET
Abs Immature Granulocytes: 0.02 10*3/uL (ref 0.00–0.07)
Basophils Absolute: 0 10*3/uL (ref 0.0–0.1)
Basophils Relative: 0 %
Eosinophils Absolute: 0.1 10*3/uL (ref 0.0–0.5)
Eosinophils Relative: 2 %
HCT: 32.8 % — ABNORMAL LOW (ref 36.0–46.0)
Hemoglobin: 10.6 g/dL — ABNORMAL LOW (ref 12.0–15.0)
Immature Granulocytes: 0 %
Lymphocytes Relative: 25 %
Lymphs Abs: 1.5 10*3/uL (ref 0.7–4.0)
MCH: 30.5 pg (ref 26.0–34.0)
MCHC: 32.3 g/dL (ref 30.0–36.0)
MCV: 94.3 fL (ref 80.0–100.0)
Monocytes Absolute: 0.6 10*3/uL (ref 0.1–1.0)
Monocytes Relative: 9 %
Neutro Abs: 3.9 10*3/uL (ref 1.7–7.7)
Neutrophils Relative %: 64 %
Platelets: 226 10*3/uL (ref 150–400)
RBC: 3.48 MIL/uL — ABNORMAL LOW (ref 3.87–5.11)
RDW: 14.7 % (ref 11.5–15.5)
WBC: 6.2 10*3/uL (ref 4.0–10.5)
nRBC: 0 % (ref 0.0–0.2)

## 2018-08-18 LAB — COMPREHENSIVE METABOLIC PANEL
ALT: 16 U/L (ref 0–44)
AST: 24 U/L (ref 15–41)
Albumin: 3.4 g/dL — ABNORMAL LOW (ref 3.5–5.0)
Alkaline Phosphatase: 78 U/L (ref 38–126)
Anion gap: 7 (ref 5–15)
BUN: 21 mg/dL (ref 8–23)
CO2: 29 mmol/L (ref 22–32)
Calcium: 9 mg/dL (ref 8.9–10.3)
Chloride: 104 mmol/L (ref 98–111)
Creatinine, Ser: 0.79 mg/dL (ref 0.44–1.00)
GFR calc Af Amer: >60 mL/min (ref >60)
GFR calc non Af Amer: >60 mL/min (ref >60)
Glucose, Bld: 147 mg/dL — ABNORMAL HIGH (ref 70–99)
Potassium: 4.1 mmol/L (ref 3.5–5.1)
Sodium: 140 mmol/L (ref 135–145)
Total Bilirubin: 0.4 mg/dL (ref 0.3–1.2)
Total Protein: 6.9 g/dL (ref 6.5–8.1)

## 2018-08-18 MED ORDER — SODIUM CHLORIDE 0.9 % IV SOLN
60.0000 mg/m2 | Freq: Once | INTRAVENOUS | Status: DC
  Administered 2018-08-18: 84 mg via INTRAVENOUS
  Filled 2018-08-18: qty 14

## 2018-08-18 MED ORDER — SODIUM CHLORIDE 0.9 % IV SOLN
Freq: Once | INTRAVENOUS | Status: AC
  Administered 2018-08-18: 12:00:00 via INTRAVENOUS
  Filled 2018-08-18: qty 5

## 2018-08-18 MED ORDER — FAMOTIDINE IN NACL 20-0.9 MG/50ML-% IV SOLN
20.0000 mg | Freq: Once | INTRAVENOUS | Status: AC
  Administered 2018-08-18: 20 mg via INTRAVENOUS
  Filled 2018-08-18: qty 50

## 2018-08-18 MED ORDER — PALONOSETRON HCL INJECTION 0.25 MG/5ML
0.2500 mg | Freq: Once | INTRAVENOUS | Status: AC
  Administered 2018-08-18: 0.25 mg via INTRAVENOUS
  Filled 2018-08-18: qty 5

## 2018-08-18 MED ORDER — SODIUM CHLORIDE 0.9 % IV SOLN
60.0000 mg/m2 | Freq: Once | INTRAVENOUS | Status: DC
  Filled 2018-08-18: qty 14

## 2018-08-18 MED ORDER — SODIUM CHLORIDE 0.9% FLUSH
10.0000 mL | Freq: Once | INTRAVENOUS | Status: AC
  Administered 2018-08-18: 10:00:00 10 mL via INTRAVENOUS
  Filled 2018-08-18: qty 10

## 2018-08-18 MED ORDER — DIPHENHYDRAMINE HCL 50 MG/ML IJ SOLN
12.5000 mg | Freq: Once | INTRAMUSCULAR | Status: AC
  Administered 2018-08-18: 12.5 mg via INTRAVENOUS
  Filled 2018-08-18: qty 1

## 2018-08-18 MED ORDER — SODIUM CHLORIDE 0.9 % IV SOLN
Freq: Once | INTRAVENOUS | Status: AC
  Administered 2018-08-18: 11:00:00 via INTRAVENOUS
  Filled 2018-08-18: qty 250

## 2018-08-18 MED ORDER — SODIUM CHLORIDE 0.9 % IV SOLN
113.4000 mg | Freq: Once | INTRAVENOUS | Status: AC
  Administered 2018-08-18: 110 mg via INTRAVENOUS
  Filled 2018-08-18: qty 11

## 2018-08-18 MED ORDER — HEPARIN SOD (PORK) LOCK FLUSH 100 UNIT/ML IV SOLN
500.0000 [IU] | Freq: Once | INTRAVENOUS | Status: AC | PRN
  Administered 2018-08-18: 500 [IU]
  Filled 2018-08-18: qty 5

## 2018-08-18 NOTE — Progress Notes (Signed)
Patient here for evaluation and treatment today. She states that she is feeling ok today, but complains of feeling a little shaky. She states that she feels this way from time to time, and this is not new. She states that her appetite is good most days, and she denies having nausea, vomiting or diarrhea. She denies having any pain at this time.

## 2018-08-18 NOTE — Patient Instructions (Signed)
Paclitaxel injection What is this medicine? PACLITAXEL (PAK li TAX el) is a chemotherapy drug. It targets fast dividing cells, like cancer cells, and causes these cells to die. This medicine is used to treat ovarian cancer, breast cancer, lung cancer, Kaposi's sarcoma, and other cancers. This medicine may be used for other purposes; ask your health care provider or pharmacist if you have questions. COMMON BRAND NAME(S): Onxol, Taxol What should I tell my health care provider before I take this medicine? They need to know if you have any of these conditions:  history of irregular heartbeat  liver disease  low blood counts, like low white cell, platelet, or red cell counts  lung or breathing disease, like asthma  tingling of the fingers or toes, or other nerve disorder  an unusual or allergic reaction to paclitaxel, alcohol, polyoxyethylated castor oil, other chemotherapy, other medicines, foods, dyes, or preservatives  pregnant or trying to get pregnant  breast-feeding How should I use this medicine? This drug is given as an infusion into a vein. It is administered in a hospital or clinic by a specially trained health care professional. Talk to your pediatrician regarding the use of this medicine in children. Special care may be needed. Overdosage: If you think you have taken too much of this medicine contact a poison control center or emergency room at once. NOTE: This medicine is only for you. Do not share this medicine with others. What if I miss a dose? It is important not to miss your dose. Call your doctor or health care professional if you are unable to keep an appointment. What may interact with this medicine? Do not take this medicine with any of the following medications:  disulfiram  metronidazole This medicine may also interact with the following medications:  antiviral medicines for hepatitis, HIV or AIDS  certain antibiotics like erythromycin and  clarithromycin  certain medicines for fungal infections like ketoconazole and itraconazole  certain medicines for seizures like carbamazepine, phenobarbital, phenytoin  gemfibrozil  nefazodone  rifampin  St. John's wort This list may not describe all possible interactions. Give your health care provider a list of all the medicines, herbs, non-prescription drugs, or dietary supplements you use. Also tell them if you smoke, drink alcohol, or use illegal drugs. Some items may interact with your medicine. What should I watch for while using this medicine? Your condition will be monitored carefully while you are receiving this medicine. You will need important blood work done while you are taking this medicine. This medicine can cause serious allergic reactions. To reduce your risk you will need to take other medicine(s) before treatment with this medicine. If you experience allergic reactions like skin rash, itching or hives, swelling of the face, lips, or tongue, tell your doctor or health care professional right away. In some cases, you may be given additional medicines to help with side effects. Follow all directions for their use. This drug may make you feel generally unwell. This is not uncommon, as chemotherapy can affect healthy cells as well as cancer cells. Report any side effects. Continue your course of treatment even though you feel ill unless your doctor tells you to stop. Call your doctor or health care professional for advice if you get a fever, chills or sore throat, or other symptoms of a cold or flu. Do not treat yourself. This drug decreases your body's ability to fight infections. Try to avoid being around people who are sick. This medicine may increase your risk to bruise   or bleed. Call your doctor or health care professional if you notice any unusual bleeding. Be careful brushing and flossing your teeth or using a toothpick because you may get an infection or bleed more easily.  If you have any dental work done, tell your dentist you are receiving this medicine. Avoid taking products that contain aspirin, acetaminophen, ibuprofen, naproxen, or ketoprofen unless instructed by your doctor. These medicines may hide a fever. Do not become pregnant while taking this medicine. Women should inform their doctor if they wish to become pregnant or think they might be pregnant. There is a potential for serious side effects to an unborn child. Talk to your health care professional or pharmacist for more information. Do not breast-feed an infant while taking this medicine. Men are advised not to father a child while receiving this medicine. This product may contain alcohol. Ask your pharmacist or healthcare provider if this medicine contains alcohol. Be sure to tell all healthcare providers you are taking this medicine. Certain medicines, like metronidazole and disulfiram, can cause an unpleasant reaction when taken with alcohol. The reaction includes flushing, headache, nausea, vomiting, sweating, and increased thirst. The reaction can last from 30 minutes to several hours. What side effects may I notice from receiving this medicine? Side effects that you should report to your doctor or health care professional as soon as possible:  allergic reactions like skin rash, itching or hives, swelling of the face, lips, or tongue  breathing problems  changes in vision  fast, irregular heartbeat  high or low blood pressure  mouth sores  pain, tingling, numbness in the hands or feet  signs of decreased platelets or bleeding - bruising, pinpoint red spots on the skin, black, tarry stools, blood in the urine  signs of decreased red blood cells - unusually weak or tired, feeling faint or lightheaded, falls  signs of infection - fever or chills, cough, sore throat, pain or difficulty passing urine  signs and symptoms of liver injury like dark yellow or brown urine; general ill feeling or  flu-like symptoms; light-colored stools; loss of appetite; nausea; right upper belly pain; unusually weak or tired; yellowing of the eyes or skin  swelling of the ankles, feet, hands  unusually slow heartbeat Side effects that usually do not require medical attention (report to your doctor or health care professional if they continue or are bothersome):  diarrhea  hair loss  loss of appetite  muscle or joint pain  nausea, vomiting  pain, redness, or irritation at site where injected  tiredness This list may not describe all possible side effects. Call your doctor for medical advice about side effects. You may report side effects to FDA at 1-800-FDA-1088. Where should I keep my medicine? This drug is given in a hospital or clinic and will not be stored at home. NOTE: This sheet is a summary. It may not cover all possible information. If you have questions about this medicine, talk to your doctor, pharmacist, or health care provider.  2020 Elsevier/Gold Standard (2016-10-09 13:14:55)  

## 2018-08-18 NOTE — Progress Notes (Addendum)
[place]  [phone] [phone]   Name: Natalie Stone Date: 08/18/2018 MRN: [id number]  DOB: 1927/02/16  Patient Care Team: Rusty Aus, MD as PCP - General (Internal Medicine) Clent Jacks, RN as Registered Nurse    REASON FOR CONSULTATION: Palliative Care consult requested for this 85 y.o. female with multiple medical problems including recurrent ovarian cancer status post neoadjuvant chemotherapy and surgery in 2013.  Patient has been followed by Gyn Onc.  She was hospitalized at [facility] 07/05/2018-07/13/2018 with abdominal pain and melena and was found to have hydronephrosis of the left kidney and increasing CA-125 with CT demonstrating carcinomatosis and a pelvic mass concerning for recurrence.  Patient is status post left ureteral stent on 5/20.  She has been referred back to outpatient medical oncology/GYN oncology.  Patient is also been referred to palliative care for support.  SOCIAL HISTORY:     reports that she has never smoked. She has never used smokeless tobacco. She reports that she does not drink alcohol or use drugs.  Patient is recently widowed having lost her husband in March 2020.  She lives at home with her daughter.  She also has a granddaughter who lives nearby and is involved in her care.  Her granddaughter is an OB/GYN.    Patient formally worked as a Pharmacist, hospital.  She lived in the [place] area for over 40 years and moved here several years ago to be near family.  Patient lives in the independent living section of [place].  ADVANCE DIRECTIVES:  Not on file  CODE STATUS:   PAST MEDICAL HISTORY: Past Medical History:  Diagnosis Date  . CAD (coronary artery disease)   . Chronic kidney disease    urinary incontinence  . Hyperthyroidism   . Hypothyroidism (acquired)   . Ovarian cancer ([place]) 2014  . Shingles   . Third degree AV block ([place]) 10/09/2015    PAST SURGICAL HISTORY:  Past  Surgical History:  Procedure Laterality Date  . ABDOMINAL HYSTERECTOMY    . BLADDER REPAIR    . CORONARY ANGIOPLASTY WITH STENT PLACEMENT  2001   Dr. Isaiah Blakes in [place]  . CYSTOURETHROSCOPY, WITH INSERTION OF INDWELLING URETERAL STENT (EG,GIBBONS OR DOUBLE-J TYPE) (Left Ureter Left   . PACEMAKER INSERTION N/A 10/11/2015   Procedure: INSERTION PACEMAKER;  Surgeon: [name], MD;  Location: [facility] ORS;  Service: Cardiovascular;  Laterality: N/A;  . PARTIAL HYSTERECTOMY    . PORTA CATH INSERTION N/A 07/24/2018   Procedure: PORTA CATH INSERTION;  Surgeon: Algernon Huxley, MD;  Location: [place] CV LAB;  Service: Cardiovascular;  Laterality: N/A;    HEMATOLOGY/ONCOLOGY HISTORY:  Oncology History Overview Note  # NOV 2013-RIGHT OVARIAN CANCER-HIGH GRADE SEROUS CA 125-996 STAGE III [Dr[facility]Stephanie [facility]];Jan 2014- neo-adj chemo x3 M;April 2014 TAH & BSO ; chemo x 58M [finished C-6; July 2014]. Ca 125 -13; N  # GENETIC TESTING-"OncoGene Dx"-NEG  # May-June 2020- Recurrent adenocarcinoma [ascites s/p para; [facility]] ca-[id number].   # June 8th 2020- Carbo AUC 2  # may 2020- Left hydro-ureteral -n ephrosis [s/p stenting]; diarrhea-? malignancy s/p sigmoidscopy  [[facility]]  CT- showed severe left hydro ureteral nephrosis.  Sigmoid wall thickening differential includes carcinomatosis and colitis.  She did had a repeat scan on 07/09/2018 which showed interval increase in ascites mostly within the pelvis with associated enhancement of the peritoneum suggesting carcinomatosis.  Redemonstration of sigmoid wall thickening which may represent colitis versus carcinomatosis.  Delayed  left nephrogram with severe left hydro-ureteronephrosis similar to prior.  Given findings of colitis as well as ongoing symptoms of melena and diarrhea patient underwent EGD as well as flexible sigmoidoscopy which showed Schatzki's ring and diverticulosis but no evidence of tumor.  Her symptoms are possibly  secondary to carcinomatosis.  She also had other stool studies done including C. difficile and stool cultures which were negative.Patient did undergo a diagnostic tap of her ascites on 07/09/2018 which was consistent with adenocarcinoma  S/p pacemaker [Dr.Parascholes] --------------------------------------   DIAGNOSIS: recurrent ovarain cancer  STAGE:    IV  ;GOALS: pallaitive  CURRENT/MOST RECENT THERAPY: weekly carbo-Taxol     Malignant neoplasm of ovary ([place])  11/23/2015 Initial Diagnosis   Ovarian cancer, unspecified laterality ([place])   07/28/2018 -  Chemotherapy   The patient had palonosetron (ALOXI) injection 0.25 mg, 0.25 mg, Intravenous,  Once, 2 of 6 cycles Administration: 0.25 mg (07/28/2018), 0.25 mg (08/04/2018), 0.25 mg (08/11/2018), 0.25 mg (08/18/2018) CARBOplatin (PARAPLATIN) 110 mg in sodium chloride 0.9 % 100 mL chemo infusion, 110 mg (100 % of original dose 113.4 mg), Intravenous,  Once, 2 of 6 cycles Dose modification:   (original dose 113.4 mg, Cycle 1) Administration: 110 mg (07/28/2018), 110 mg (08/04/2018), 110 mg (08/11/2018), 110 mg (08/18/2018) PACLitaxel (TAXOL) 84 mg in sodium chloride 0.9 % 250 mL chemo infusion (> 56m/m2), 60 mg/m2 = 84 mg (100 % of original dose 60 mg/m2), Intravenous,  Once, 2 of 6 cycles Dose modification: 60 mg/m2 (original dose 60 mg/m2, Cycle 1, Reason: Other (see comments), Comment: weekly) Administration: 84 mg (08/18/2018) fosaprepitant (EMEND) 150 mg, dexamethasone (DECADRON) 12 mg in sodium chloride 0.9 % 145 mL IVPB, , Intravenous,  Once, 2 of 6 cycles Administration:  (07/28/2018),  (08/04/2018),  (08/11/2018),  (08/18/2018)  for chemotherapy treatment.      ALLERGIES:  is allergic to penicillins and ativan [lorazepam].  MEDICATIONS:  Current Outpatient Medications  Medication Sig Dispense Refill  . aspirin EC 81 MG tablet Take 81 mg by mouth daily.    .Marland Kitchenatorvastatin (LIPITOR) 10 MG tablet Take 10 mg by mouth at bedtime.     .Marland Kitchenb complex  vitamins tablet Take 1 tablet by mouth daily.    .Marland KitchenBioflavonoid Products (ESTER C PO) Take 500 mg by mouth daily.     . Calcium Carbonate-Vitamin D (CALTRATE 600+D PO) Take 1 tablet by mouth daily.    . Cholecalciferol (VITAMIN D3) 1000 units CAPS Take 1,000 Units by mouth daily.     . Coenzyme Q10 (COQ10) 100 MG CAPS Take 100 mg by mouth daily.    . Glucosamine-MSM-Hyaluronic Acd (JOINT HEALTH PO) Take 1 tablet by mouth 2 (two) times a day.     . levothyroxine (SYNTHROID) 100 MCG tablet Take 100 mcg by mouth daily before breakfast.     . mirtazapine (REMERON) 7.5 MG tablet Take 7.5 mg by mouth at bedtime.    .Marland Kitchenomeprazole (PRILOSEC) 40 MG capsule Take 40 mg by mouth daily.    . ondansetron (ZOFRAN) 4 MG tablet Take 1 tablet (4 mg total) by mouth every 8 (eight) hours as needed for nausea or vomiting. (Patient not taking: Reported on 08/11/2018) 90 tablet 0   No current facility-administered medications for this visit.     VITAL SIGNS: There were no vitals taken for this visit. There were no vitals filed for this visit.  Estimated body mass index is 19.33 kg/m as calculated from the following:   Height as of 08/11/18:  5' 1"  (1.549 m).   Weight as of an earlier encounter on 08/18/18: 102 lb 4.8 oz (46.4 kg).  LABS: CBC:    Component Value Date/Time   WBC 6.2 08/18/2018 0933   HGB 10.6 (L) 08/18/2018 0933   HCT 32.8 (L) 08/18/2018 0933   PLT 226 08/18/2018 0933   MCV 94.3 08/18/2018 0933   NEUTROABS 3.9 08/18/2018 0933   LYMPHSABS 1.5 08/18/2018 0933   MONOABS 0.6 08/18/2018 0933   EOSABS 0.1 08/18/2018 0933   BASOSABS 0.0 08/18/2018 0933   Comprehensive Metabolic Panel:    Component Value Date/Time   NA 140 08/18/2018 0933   K 4.1 08/18/2018 0933   CL 104 08/18/2018 0933   CO2 29 08/18/2018 0933   BUN 21 08/18/2018 0933   CREATININE 0.79 08/18/2018 0933   GLUCOSE 147 (H) 08/18/2018 0933   CALCIUM 9.0 08/18/2018 0933   AST 24 08/18/2018 0933   ALT 16 08/18/2018 0933    ALKPHOS 78 08/18/2018 0933   BILITOT 0.4 08/18/2018 0933   PROT 6.9 08/18/2018 0933   ALBUMIN 3.4 (L) 08/18/2018 0933    RADIOGRAPHIC STUDIES: No results found.  PERFORMANCE STATUS (ECOG) : 1 - Symptomatic but completely ambulatory  Review of Systems Unless otherwise noted, a complete review of systems is negative.  Physical Exam General: NAD, frail appearing, thin Pulmonary: unlabored Extremities: no edema Skin: no rashes Neurological: Weakness but otherwise nonfocal  IMPRESSION: I met with patient today while she was receiving an infusion.   Patient reports that she is doing reasonably well without significant changes or concerns. Appetite is reportedly stable, as is weight. However, patient remains underweight. She is being followed by RD. Discussed importance of increasing oral supplements.   Patient denies distressing symptoms today.   We discussed advance care planning. Patient says she has talked with her daughter and communicated her wish not to be resuscitated.  I completed a MOST form today. The patient and family outlined their wishes for the following treatment decisions:  Cardiopulmonary Resuscitation: Do Not Attempt Resuscitation (DNR/No CPR)  Medical Interventions: Limited Additional Interventions: Use medical treatment, IV fluids and cardiac monitoring as indicated, DO NOT USE intubation or mechanical ventilation. May consider use of less invasive airway support such as BiPAP or CPAP. Also provide comfort measures. Transfer to the hospital if indicated. Avoid intensive care.   Antibiotics: Antibiotics if indicated  IV Fluids: IV fluids for a defined trial period  Feeding Tube: No feeding tube   I attempted to call her daughter without success.   Note that patient asked me about hospice today. She says she would like to engage with hospice when it is appropriate as a way to ensure that she is comfortable and that family is well supported. She had hospice for her  husband prior to his passing.   PLAN: -Continue current scope of treatment. -Increase oral supplements to twice daily -MOST form completed. DNR.    Patient expressed understanding and was in agreement with this plan. She also understands that She can call the clinic at any time with any questions, concerns, or complaints.     Time Total: 30 minutes  Visit consisted of counseling and education dealing with the complex and emotionally intense issues of symptom management and palliative care in the setting of serious and potentially life-threatening illness.Greater than 50%  of this time was spent counseling and coordinating care related to the above assessment and plan.  Signed by: Altha Harm, PhD, NP-C [phone] (Work Cell)

## 2018-08-18 NOTE — Progress Notes (Signed)
Pt tolerated first treatment of taxol well with no signs of reaction or complications. VSS throughout treatment and upon discharge. All questions answered at this time.   Santana Edell [employer]

## 2018-08-18 NOTE — Progress Notes (Signed)
Hematology/Oncology Consult note [facility]  Telephone:([phone] [phone]  Patient Care Team: Rusty Aus, MD as PCP - General (Internal Medicine) Clent Jacks, RN as Registered Nurse   Name of the patient: Natalie Stone  [id number]  March 04, 1926   Date of visit: 08/18/18  Diagnosis-recurrent ovarian cancer  Chief complaint/ Reason for visit-on treatment assessment prior to cycle 4 of carbo-taxol chemotherapy  Heme/Onc history: Patient is a 85 year old female with a past medical history significant for stage III ovarian cancer that was treated in [place] and 2013.  She underwent neoadjuvant chemotherapy followed by surgery with Dr. Beverly Milch followed by adjuvant chemotherapy.  Genetic testing was negative.  She then established care at [facility] in 2017.  More recently patient was found to have a consistently increasing Ca1 25 level from 42.6 in December 20 18-781 in February 2020.  Most recent Ca1 25 was 02/21/2005 on 07/06/2018.  Scans were however negative for any recurrence.  Patient had CT abdomen and pelvis with contrast done on 07/06/2018 which showed severe left hydro ureteral nephrosis.  Sigmoid wall thickening differential includes carcinomatosis and colitis.  She did had a repeat scan on 07/09/2018 which showed interval increase in ascites mostly within the pelvis with associated enhancement of the peritoneum suggesting carcinomatosis.  Redemonstration of sigmoid wall thickening which may represent colitis versus carcinomatosis.  Delayed left nephrogram with severe left hydro-ureteronephrosis similar to prior.  Given findings of colitis as well as ongoing symptoms of melena and diarrhea patient underwent EGD as well as flexible sigmoidoscopy which showed Schatzki's ring and diverticulosis but no evidence of tumor.  Her symptoms are possibly secondary to carcinomatosis.  She also had other stool studies done including C. difficile and stool cultures  which were negative.Patient did undergo a diagnostic tap of her ascites on 07/09/2018 which was consistent with adenocarcinoma  Plan was to do weekly carbo-taxol chemotherapy with carboplatin only for the first 3 weeks to assess patient tolerance.  Cycle 1 was started on 07/28/2018.  Interval history- Patient says she feels well today and tolerated treatment last week well without significant side effects. Appetite continues to be reduced. She still have problems with mucous stools, sometimes watery, previously had negative stool studies, and she feels that symptoms have improved. Urinary symptoms have resolved. She increased supplement shakes and feels weight is up. She is seeing dietitian today and palliative care. She is nervous about adding taxol today and worried if she will have side effects.   ECOG PS- 2 Pain scale- 0 Opioid associated constipation- no  Review of systems- Review of Systems  Constitutional: Positive for malaise/fatigue (stable). Negative for chills, fever and weight loss.  HENT: Negative for congestion, ear discharge and nosebleeds.   Eyes: Negative for blurred vision.  Respiratory: Negative for cough, hemoptysis, sputum production, shortness of breath and wheezing.   Cardiovascular: Negative for chest pain, palpitations, orthopnea and claudication.  Gastrointestinal: Positive for diarrhea (improved). Negative for abdominal pain, blood in stool, constipation, heartburn, melena, nausea and vomiting.  Genitourinary: Negative for dysuria, flank pain, frequency, hematuria and urgency.  Musculoskeletal: Negative for back pain, joint pain and myalgias.  Skin: Negative for rash.  Neurological: Negative for dizziness, tingling, focal weakness, seizures, weakness and headaches.  Endo/Heme/Allergies: Does not bruise/bleed easily.  Psychiatric/Behavioral: Negative for depression and suicidal ideas. The patient does not have insomnia.       Allergies  Allergen Reactions  .  Penicillins Anaphylaxis    Has patient had a PCN reaction  causing immediate rash, facial/tongue/throat swelling, SOB or lightheadedness with hypotension: Yes Has patient had a PCN reaction causing severe rash involving mucus membranes or skin necrosis: No Has patient had a PCN reaction that required hospitalization No Has patient had a PCN reaction occurring within the last 10 years: No If all of the above answers are "NO", then may proceed with Cephalosporin use.  . Ativan [Lorazepam] Other (See Comments)    Made her not remember for many hours.     Past Medical History:  Diagnosis Date  . CAD (coronary artery disease)   . Chronic kidney disease    urinary incontinence  . Hyperthyroidism   . Hypothyroidism (acquired)   . Ovarian cancer ([place]) 2014  . Shingles   . Third degree AV block ([place]) 10/09/2015     Past Surgical History:  Procedure Laterality Date  . ABDOMINAL HYSTERECTOMY    . BLADDER REPAIR    . CORONARY ANGIOPLASTY WITH STENT PLACEMENT  2001   Dr. Isaiah Blakes in [place]  . CYSTOURETHROSCOPY, WITH INSERTION OF INDWELLING URETERAL STENT (EG,GIBBONS OR DOUBLE-J TYPE) (Left Ureter Left   . PACEMAKER INSERTION N/A 10/11/2015   Procedure: INSERTION PACEMAKER;  Surgeon: [name], MD;  Location: [facility] ORS;  Service: Cardiovascular;  Laterality: N/A;  . PARTIAL HYSTERECTOMY    . PORTA CATH INSERTION N/A 07/24/2018   Procedure: PORTA CATH INSERTION;  Surgeon: Algernon Huxley, MD;  Location: [place] CV LAB;  Service: Cardiovascular;  Laterality: N/A;    Social History   Socioeconomic History  . Marital status: Widowed    Spouse name: Not on file  . Number of children: Not on file  . Years of education: Not on file  . Highest education level: Not on file  Occupational History  . Not on file  Social Needs  . Financial resource strain: Not on file  . Food insecurity    Worry: Not on file    Inability: Not on file  . Transportation needs    Medical: Not on file     Non-medical: Not on file  Tobacco Use  . Smoking status: Never Smoker  . Smokeless tobacco: Never Used  Substance and Sexual Activity  . Alcohol use: No  . Drug use: No  . Sexual activity: Never  Lifestyle  . Physical activity    Days per week: Not on file    Minutes per session: Not on file  . Stress: Not on file  Relationships  . Social Herbalist on phone: Not on file    Gets together: Not on file    Attends religious service: Not on file    Active member of club or organization: Not on file    Attends meetings of clubs or organizations: Not on file    Relationship status: Not on file  . Intimate partner violence    Fear of current or ex partner: Not on file    Emotionally abused: Not on file    Physically abused: Not on file    Forced sexual activity: Not on file  Other Topics Concern  . 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 Diabetes Brother      Current Outpatient Medications:  .  aspirin EC 81 MG tablet, Take 81 mg by mouth daily., Disp: , Rfl:  .  atorvastatin (LIPITOR) 10 MG  tablet, Take 10 mg by mouth at bedtime. , Disp: , Rfl:  .  b complex vitamins tablet, Take 1 tablet by mouth daily., Disp: , Rfl:  .  Bioflavonoid Products (ESTER C PO), Take 500 mg by mouth daily. , Disp: , Rfl:  .  Calcium Carbonate-Vitamin D (CALTRATE 600+D PO), Take 1 tablet by mouth daily., Disp: , Rfl:  .  Cholecalciferol (VITAMIN D3) 1000 units CAPS, Take 1,000 Units by mouth daily. , Disp: , Rfl:  .  Coenzyme Q10 (COQ10) 100 MG CAPS, Take 100 mg by mouth daily., Disp: , Rfl:  .  Glucosamine-MSM-Hyaluronic Acd (JOINT HEALTH PO), Take 1 tablet by mouth 2 (two) times a day. , Disp: , Rfl:  .  levothyroxine (SYNTHROID) 100 MCG tablet, Take 100 mcg by mouth daily before breakfast. , Disp: , Rfl:  .  mirtazapine (REMERON) 7.5 MG tablet, Take 7.5 mg by  mouth at bedtime., Disp: , Rfl:  .  omeprazole (PRILOSEC) 40 MG capsule, Take 40 mg by mouth daily., Disp: , Rfl:  .  ondansetron (ZOFRAN) 4 MG tablet, Take 1 tablet (4 mg total) by mouth every 8 (eight) hours as needed for nausea or vomiting. (Patient not taking: Reported on 08/11/2018), Disp: 90 tablet, Rfl: 0 No current facility-administered medications for this visit.   Facility-Administered Medications Ordered in Other Visits:  .  CARBOplatin (PARAPLATIN) 110 mg in sodium chloride 0.9 % 100 mL chemo infusion, 110 mg, Intravenous, Once, Sindy Guadeloupe, MD, Last Rate: 222 mL/hr at 08/18/18 1429, 110 mg at 08/18/18 1429 .  heparin lock flush 100 unit/mL, 500 Units, Intracatheter, Once PRN, Sindy Guadeloupe, MD .  PACLitaxel (TAXOL) 84 mg in sodium chloride 0.9 % 250 mL chemo infusion (> 80mg /m2), 60 mg/m2 (Treatment Plan Recorded), Intravenous, Once, Cammie Sickle, MD  Physical exam:  Vitals:   08/18/18 0959  BP: (!) 151/68  Pulse: (!) 106  Resp: 18  Temp: 98.7 F (37.1 C)  TempSrc: Tympanic  Weight: 102 lb 4.8 oz (46.4 kg)   Physical Exam Constitutional:      Comments: Thin elderly frail woman in no acute distress  HENT:     Head: Normocephalic and atraumatic.  Eyes:     Pupils: Pupils are equal, round, and reactive to light.  Neck:     Musculoskeletal: Normal range of motion.  Cardiovascular:     Rate and Rhythm: Regular rhythm. Tachycardia present.     Heart sounds: Normal heart sounds.  Pulmonary:     Effort: Pulmonary effort is normal.     Breath sounds: Normal breath sounds.  Abdominal:     General: Bowel sounds are normal.     Palpations: Abdomen is soft.  Skin:    General: Skin is warm and dry.  Neurological:     Mental Status: She is alert and oriented to person, place, and time.  Psychiatric:        Mood and Affect: Mood normal.        Behavior: Behavior normal.      CMP Latest Ref Rng & Units 08/18/2018  Glucose 70 - 99 mg/dL 147(H)  BUN 8 - 23  mg/dL 21  Creatinine 0.44 - 1.00 mg/dL 0.79  Sodium 135 - 145 mmol/L 140  Potassium 3.5 - 5.1 mmol/L 4.1  Chloride 98 - 111 mmol/L 104  CO2 22 - 32 mmol/L 29  Calcium 8.9 - 10.3 mg/dL 9.0  Total Protein 6.5 - 8.1 g/dL 6.9  Total Bilirubin 0.3 - 1.2  mg/dL 0.4  Alkaline Phos 38 - 126 U/L 78  AST 15 - 41 U/L 24  ALT 0 - 44 U/L 16   CBC Latest Ref Rng & Units 08/18/2018  WBC 4.0 - 10.5 K/uL 6.2  Hemoglobin 12.0 - 15.0 g/dL 10.6(L)  Hematocrit 36.0 - 46.0 % 32.8(L)  Platelets 150 - 400 K/uL 226     Assessment and plan- Patient is a 85 y.o. female with recurrent ovarian carcinoma who returns to clinic for treatment assessment prior to cycle 4 of weekly carbo-taxol chemotherapy.   She has tolerated first 3 cycles well without significant side effects and as we discussed we will add dose reduced taxol (60 mg/m2) for cycle 4  today.  Risks and side effects of Taxol discussed and she is agreeable with proceeding.  Labs today reviewed and counts acceptable for treatment. Case discussed with Dr. Janese Banks who agrees with proceeding with cycle 4 of carbo-taxol today.   Chemotherapy administration schedule based on MITO-7 data which evaluated two study arms:  weekly carboplatin AUC 2 and paclitaxel 60 mg/m versus dose dense carbo-taxol every 3 weeks.  The study randomly assigned 822 patients with stage Ic-IV ovarian cancer who received carboplatin (AUC 6) and paclitaxel (175 mg/m) every 3 weeks for 6 cycles versus weekly carboplatin AUC 2 and paclitaxel 60 mg/m for 18 consecutive administrations. Progression free survival was slightly, but not significantly longer with weekly regimen (18.8 versus 16.5 months) however, participants on weekly chemotherapy arm showed significantly fewer side effects: Hematologic, toxicity, vomiting, neuropathy, hair loss, and dosing schedules associated with significantly improved quality of life analysis (FACT ovarian scale and FACT/GOG-ntx subscale). Given her frailty and her  goal of maintaining quality of life, Dr. Fransisca Connors has recommended continuing weekly dose reduced chemotherapy schedule which she is agreeable to.   CA-125 somewhat decreased on 08/11/2018.  Symptomatically slightly improved.  Plan to check Ca1 25 at next visit.  If number does not trend down with chemotherapy would recommend CT scan.  We will plan to have her see GYN oncology for follow-up around cycle 9.  She has some tachycardia again today which appears to be baseline for her.  She has a pacemaker.  Urinary symptoms have resolved and she denies other symptoms. Encouraged oral intake and hydration.   Follow-up with dietitian and palliative care today.  Return to clinic in 1 week for port/labs: CBC w/ diff, CMP, CA125, and see Dr. Janese Banks for consideration of cycle 5 of carbo-taxol.    Visit Diagnosis 1. Malignant neoplasm of ovary, unspecified laterality ([place])   2. Encounter for antineoplastic chemotherapy     Beckey Rutter, DNP, AGNP-C [place] [facility] [phone] (work cell) [phone] (office)  CC: Dr. Janese Banks

## 2018-08-18 NOTE — Progress Notes (Signed)
Nutrition Follow-up:  Patient with recurrent ovarian cancer currently on chemotherapy.    Spoke with patient during infusion this am.  Patient reports that she knows she still needs to increase calories.  Reports that she is drinking 1-1/2 orgain shakes per day.  Reports that she is having more formed stool vs diarrhea.  Denies nausea or vomiting.  Reports that she recently saw cardiologist and he told her she could eat whatever she wanted.      Medications: remeron  Labs: reviewed  Anthropometrics:   Weight 102 4.8 oz today increased from 99 lb   NUTRITION DIAGNOSIS: Inadequate oral intake improving with weight gain    INTERVENTION:  Reviewed ways to increase calories and protein in diet.   Provided recipe book for patient for new ideas to help increase calories Encouraged oral nutrition supplement at least BID.      MONITORING, EVALUATION, GOAL: Patient will increase calories and protein to prevent further weight loss   NEXT VISIT: Monday, July 13th  Jakie Debow B. Zenia Resides, Friend, [place] Registered Dietitian [phone] (pager)

## 2018-08-19 DIAGNOSIS — C561 Malignant neoplasm of right ovary: Secondary | ICD-10-CM | POA: Diagnosis not present

## 2018-08-19 DIAGNOSIS — Z79899 Other long term (current) drug therapy: Secondary | ICD-10-CM | POA: Diagnosis not present

## 2018-08-19 DIAGNOSIS — R530 Neoplastic (malignant) related fatigue: Secondary | ICD-10-CM | POA: Diagnosis not present

## 2018-08-19 DIAGNOSIS — Z950 Presence of cardiac pacemaker: Secondary | ICD-10-CM | POA: Diagnosis not present

## 2018-08-19 DIAGNOSIS — Z7982 Long term (current) use of aspirin: Secondary | ICD-10-CM | POA: Diagnosis not present

## 2018-08-19 DIAGNOSIS — Z90710 Acquired absence of both cervix and uterus: Secondary | ICD-10-CM | POA: Diagnosis not present

## 2018-08-19 DIAGNOSIS — N1330 Unspecified hydronephrosis: Secondary | ICD-10-CM | POA: Diagnosis not present

## 2018-08-25 ENCOUNTER — Other Ambulatory Visit: Payer: Self-pay

## 2018-08-25 ENCOUNTER — Inpatient Hospital Stay: Payer: Medicare HMO | Attending: Oncology

## 2018-08-25 ENCOUNTER — Encounter: Payer: Self-pay | Admitting: Oncology

## 2018-08-25 ENCOUNTER — Inpatient Hospital Stay (HOSPITAL_BASED_OUTPATIENT_CLINIC_OR_DEPARTMENT_OTHER): Payer: Medicare HMO | Admitting: Oncology

## 2018-08-25 ENCOUNTER — Inpatient Hospital Stay: Payer: Medicare HMO

## 2018-08-25 ENCOUNTER — Inpatient Hospital Stay (HOSPITAL_BASED_OUTPATIENT_CLINIC_OR_DEPARTMENT_OTHER): Payer: Medicare HMO | Admitting: Nurse Practitioner

## 2018-08-25 VITALS — HR 103

## 2018-08-25 VITALS — BP 128/64 | HR 118 | Temp 98.9°F | Resp 18 | Wt 100.7 lb

## 2018-08-25 DIAGNOSIS — Z923 Personal history of irradiation: Secondary | ICD-10-CM | POA: Diagnosis not present

## 2018-08-25 DIAGNOSIS — C569 Malignant neoplasm of unspecified ovary: Secondary | ICD-10-CM

## 2018-08-25 DIAGNOSIS — E43 Unspecified severe protein-calorie malnutrition: Secondary | ICD-10-CM | POA: Insufficient documentation

## 2018-08-25 DIAGNOSIS — E039 Hypothyroidism, unspecified: Secondary | ICD-10-CM | POA: Insufficient documentation

## 2018-08-25 DIAGNOSIS — Z90710 Acquired absence of both cervix and uterus: Secondary | ICD-10-CM | POA: Insufficient documentation

## 2018-08-25 DIAGNOSIS — Z515 Encounter for palliative care: Secondary | ICD-10-CM | POA: Diagnosis not present

## 2018-08-25 DIAGNOSIS — Z5111 Encounter for antineoplastic chemotherapy: Secondary | ICD-10-CM

## 2018-08-25 DIAGNOSIS — R971 Elevated cancer antigen 125 [CA 125]: Secondary | ICD-10-CM | POA: Insufficient documentation

## 2018-08-25 DIAGNOSIS — I2510 Atherosclerotic heart disease of native coronary artery without angina pectoris: Secondary | ICD-10-CM | POA: Diagnosis not present

## 2018-08-25 DIAGNOSIS — Z66 Do not resuscitate: Secondary | ICD-10-CM | POA: Insufficient documentation

## 2018-08-25 DIAGNOSIS — Z79899 Other long term (current) drug therapy: Secondary | ICD-10-CM | POA: Diagnosis not present

## 2018-08-25 DIAGNOSIS — R630 Anorexia: Secondary | ICD-10-CM | POA: Diagnosis not present

## 2018-08-25 DIAGNOSIS — Z90722 Acquired absence of ovaries, bilateral: Secondary | ICD-10-CM | POA: Diagnosis not present

## 2018-08-25 DIAGNOSIS — D649 Anemia, unspecified: Secondary | ICD-10-CM | POA: Insufficient documentation

## 2018-08-25 DIAGNOSIS — Z95828 Presence of other vascular implants and grafts: Secondary | ICD-10-CM

## 2018-08-25 DIAGNOSIS — D701 Agranulocytosis secondary to cancer chemotherapy: Secondary | ICD-10-CM | POA: Diagnosis not present

## 2018-08-25 DIAGNOSIS — N189 Chronic kidney disease, unspecified: Secondary | ICD-10-CM | POA: Insufficient documentation

## 2018-08-25 LAB — COMPREHENSIVE METABOLIC PANEL
ALT: 20 U/L (ref 0–44)
AST: 26 U/L (ref 15–41)
Albumin: 3.5 g/dL (ref 3.5–5.0)
Alkaline Phosphatase: 83 U/L (ref 38–126)
Anion gap: 11 (ref 5–15)
BUN: 26 mg/dL — ABNORMAL HIGH (ref 8–23)
CO2: 27 mmol/L (ref 22–32)
Calcium: 9.1 mg/dL (ref 8.9–10.3)
Chloride: 101 mmol/L (ref 98–111)
Creatinine, Ser: 0.79 mg/dL (ref 0.44–1.00)
GFR calc Af Amer: >60 mL/min (ref >60)
GFR calc non Af Amer: >60 mL/min (ref >60)
Glucose, Bld: 176 mg/dL — ABNORMAL HIGH (ref 70–99)
Potassium: 3.6 mmol/L (ref 3.5–5.1)
Sodium: 139 mmol/L (ref 135–145)
Total Bilirubin: 0.4 mg/dL (ref 0.3–1.2)
Total Protein: 7 g/dL (ref 6.5–8.1)

## 2018-08-25 LAB — CBC WITH DIFFERENTIAL/PLATELET
Abs Immature Granulocytes: 0.04 10*3/uL (ref 0.00–0.07)
Basophils Absolute: 0 10*3/uL (ref 0.0–0.1)
Basophils Relative: 1 %
Eosinophils Absolute: 0.1 10*3/uL (ref 0.0–0.5)
Eosinophils Relative: 1 %
HCT: 32.5 % — ABNORMAL LOW (ref 36.0–46.0)
Hemoglobin: 10.5 g/dL — ABNORMAL LOW (ref 12.0–15.0)
Immature Granulocytes: 1 %
Lymphocytes Relative: 24 %
Lymphs Abs: 1.4 10*3/uL (ref 0.7–4.0)
MCH: 30.3 pg (ref 26.0–34.0)
MCHC: 32.3 g/dL (ref 30.0–36.0)
MCV: 93.7 fL (ref 80.0–100.0)
Monocytes Absolute: 0.2 10*3/uL (ref 0.1–1.0)
Monocytes Relative: 4 %
Neutro Abs: 4.2 10*3/uL (ref 1.7–7.7)
Neutrophils Relative %: 69 %
Platelets: 199 10*3/uL (ref 150–400)
RBC: 3.47 MIL/uL — ABNORMAL LOW (ref 3.87–5.11)
RDW: 14.4 % (ref 11.5–15.5)
WBC: 6 10*3/uL (ref 4.0–10.5)
nRBC: 0 % (ref 0.0–0.2)

## 2018-08-25 MED ORDER — SODIUM CHLORIDE 0.9 % IV SOLN
60.0000 mg/m2 | Freq: Once | INTRAVENOUS | Status: AC
  Administered 2018-08-25: 84 mg via INTRAVENOUS
  Filled 2018-08-25: qty 14

## 2018-08-25 MED ORDER — SODIUM CHLORIDE 0.9 % IV SOLN
113.4000 mg | Freq: Once | INTRAVENOUS | Status: AC
  Administered 2018-08-25: 110 mg via INTRAVENOUS
  Filled 2018-08-25: qty 11

## 2018-08-25 MED ORDER — SODIUM CHLORIDE 0.9 % IV SOLN
Freq: Once | INTRAVENOUS | Status: AC
  Administered 2018-08-25: 10:00:00 via INTRAVENOUS
  Filled 2018-08-25: qty 5

## 2018-08-25 MED ORDER — FAMOTIDINE IN NACL 20-0.9 MG/50ML-% IV SOLN
20.0000 mg | Freq: Once | INTRAVENOUS | Status: AC
  Administered 2018-08-25: 20 mg via INTRAVENOUS
  Filled 2018-08-25: qty 50

## 2018-08-25 MED ORDER — HEPARIN SOD (PORK) LOCK FLUSH 100 UNIT/ML IV SOLN
500.0000 [IU] | Freq: Once | INTRAVENOUS | Status: AC | PRN
  Administered 2018-08-25: 500 [IU]
  Filled 2018-08-25: qty 5

## 2018-08-25 MED ORDER — PALONOSETRON HCL INJECTION 0.25 MG/5ML
0.2500 mg | Freq: Once | INTRAVENOUS | Status: AC
  Administered 2018-08-25: 0.25 mg via INTRAVENOUS
  Filled 2018-08-25: qty 5

## 2018-08-25 MED ORDER — DIPHENHYDRAMINE HCL 50 MG/ML IJ SOLN
12.5000 mg | Freq: Once | INTRAMUSCULAR | Status: AC
  Administered 2018-08-25: 12.5 mg via INTRAVENOUS
  Filled 2018-08-25: qty 1

## 2018-08-25 MED ORDER — SODIUM CHLORIDE 0.9 % IV SOLN
Freq: Once | INTRAVENOUS | Status: AC
  Administered 2018-08-25: 09:00:00 via INTRAVENOUS
  Filled 2018-08-25: qty 250

## 2018-08-25 MED ORDER — SODIUM CHLORIDE 0.9% FLUSH
10.0000 mL | Freq: Once | INTRAVENOUS | Status: AC
  Administered 2018-08-25: 10 mL via INTRAVENOUS
  Filled 2018-08-25: qty 10

## 2018-08-25 MED ORDER — OLANZAPINE 5 MG PO TABS: 5.0000 mg | ORAL_TABLET | Freq: Every day | ORAL | 0 refills | Status: DC

## 2018-08-25 NOTE — Progress Notes (Signed)
Hematology/Oncology Consult note [facility]  [phone] [phone]  Patient Care Team: Rusty Aus, MD as PCP - General (Internal Medicine) Clent Jacks, RN as Registered Nurse   Name of the patient: Natalie Stone  [id number]  [birth year]   Date of visit: 08/25/18  Diagnosis- recurrent ovarian cancer  Chief complaint/ Reason for visit-on treatment assessment prior to cycle 5 of weekly carboplatin and Taxol chemotherapy  Heme/Onc history: Patient is a 85 year old female with a past medical history significant for stage III ovarian cancer that was treated in [place] and 2013. She underwent neoadjuvant chemotherapy followed by surgery with Dr. Beverly Milch followed by adjuvant chemotherapy. Genetic testing was negative. She then established care at [facility] in 2017. More recently patient was found to have a consistently increasing Ca1 25 level from 42.6 in December 20 18-781 in February 2020. Most recent Ca1 25 was 02/21/2005 on 07/06/2018. Scans were however negative for any recurrence. Patient had CT abdomen and pelvis with contrast done on 07/06/2018 which showed severe left hydro ureteral nephrosis. Sigmoid wall thickening differential includes carcinomatosis and colitis. She did had a repeat scan on 07/09/2018 which showed interval increase in ascites mostly within the pelvis with associated enhancement of the peritoneum suggesting carcinomatosis. Redemonstration of sigmoid wall thickening which may represent colitis versus carcinomatosis. Delayed left nephrogram with severe left hydro-ureteronephrosis similar to prior.  Given findings of colitis as well as ongoing symptoms of melena and diarrhea patient underwent EGD as well as flexible sigmoidoscopy which showed Schatzki's ring and diverticulosis but no evidence of tumor. Her symptoms are possibly secondary to carcinomatosis. She also had other stool studies done including C.  difficile and stool cultures which were negative.Patient did undergo a diagnostic tap of her ascites on 07/09/2018 which was consistent with adenocarcinoma  Plan was to do weekly carbotaxol chemotherapy with carboplatin only for the first 3 weeks to assess patient tolerance.  Cycle 1 was started on 07/28/2018.  Interval history-reports that she has no appetite and she is still struggling to gain her weight back.  Denies any significant nausea.  Denies any abdominal pain or tingling numbness in her extremities  ECOG PS- 1 Pain scale- 0 Opioid associated constipation- no  Review of systems- Review of Systems  Constitutional: Positive for malaise/fatigue and weight loss. Negative for chills and fever.  HENT: Negative for congestion, ear discharge and nosebleeds.   Eyes: Negative for blurred vision.  Respiratory: Negative for cough, hemoptysis, sputum production, shortness of breath and wheezing.   Cardiovascular: Negative for chest pain, palpitations, orthopnea and claudication.  Gastrointestinal: Negative for abdominal pain, blood in stool, constipation, diarrhea, heartburn, melena, nausea and vomiting.  Genitourinary: Negative for dysuria, flank pain, frequency, hematuria and urgency.  Musculoskeletal: Negative for back pain, joint pain and myalgias.  Skin: Negative for rash.  Neurological: Negative for dizziness, tingling, focal weakness, seizures, weakness and headaches.  Endo/Heme/Allergies: Does not bruise/bleed easily.  Psychiatric/Behavioral: Negative for depression and suicidal ideas. The patient does not have insomnia.       Allergies  Allergen Reactions  . Penicillins Anaphylaxis    Has patient had a PCN reaction causing immediate rash, facial/tongue/throat swelling, SOB or lightheadedness with hypotension: Yes Has patient had a PCN reaction causing severe rash involving mucus membranes or skin necrosis: No Has patient had a PCN reaction that required hospitalization No Has  patient had a PCN reaction occurring within the last 10 years: No If all of the above answers are "NO",  then may proceed with Cephalosporin use.  . Ativan [Lorazepam] Other (See Comments)    Made her not remember for many hours.     Past Medical History:  Diagnosis Date  . CAD (coronary artery disease)   . Chronic kidney disease    urinary incontinence  . Hyperthyroidism   . Hypothyroidism (acquired)   . Ovarian cancer ([place]) 2014  . Shingles   . Third degree AV block ([place]) 10/09/2015     Past Surgical History:  Procedure Laterality Date  . ABDOMINAL HYSTERECTOMY    . BLADDER REPAIR    . CORONARY ANGIOPLASTY WITH STENT PLACEMENT  2001   Dr. Isaiah Blakes in [place]  . CYSTOURETHROSCOPY, WITH INSERTION OF INDWELLING URETERAL STENT (EG,GIBBONS OR DOUBLE-J TYPE) (Left Ureter Left   . PACEMAKER INSERTION N/A 10/11/2015   Procedure: INSERTION PACEMAKER;  Surgeon: [name], MD;  Location: [facility] ORS;  Service: Cardiovascular;  Laterality: N/A;  . PARTIAL HYSTERECTOMY    . PORTA CATH INSERTION N/A 07/24/2018   Procedure: PORTA CATH INSERTION;  Surgeon: Algernon Huxley, MD;  Location: [facility];  Service: Cardiovascular;  Laterality: N/A;    Social History   Socioeconomic History  . Marital status: Widowed    Spouse name: Not on file  . Number of children: Not on file  . Years of education: Not on file  . Highest education level: Not on file  Occupational History  . Not on file  Social Needs  . Financial resource strain: Not on file  . Food insecurity    Worry: Not on file    Inability: Not on file  . Transportation needs    Medical: Not on file    Non-medical: Not on file  Tobacco Use  . Smoking status: Never Smoker  . Smokeless tobacco: Never Used  Substance and Sexual Activity  . Alcohol use: No  . Drug use: No  . Sexual activity: Never  Lifestyle  . Physical activity    Days per week: Not on file    Minutes per session: Not on file  . Stress: Not on  file  Relationships  . Social Herbalist on phone: Not on file    Gets together: Not on file    Attends religious service: Not on file    Active member of club or organization: Not on file    Attends meetings of clubs or organizations: Not on file    Relationship status: Not on file  . Intimate partner violence    Fear of current or ex partner: Not on file    Emotionally abused: Not on file    Physically abused: Not on file    Forced sexual activity: Not on file  Other Topics Concern  . 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 Diabetes Brother      Current Outpatient Medications:  .  aspirin EC 81 MG tablet, Take 81 mg by mouth daily., Disp: , Rfl:  .  atorvastatin (LIPITOR) 10 MG tablet, Take 10 mg by mouth at bedtime. , Disp: , Rfl:  .  b complex vitamins tablet, Take 1 tablet by mouth daily., Disp: , Rfl:  .  Bioflavonoid Products (ESTER C PO), Take 500 mg by mouth daily. , Disp: , Rfl:  .  Calcium Carbonate-Vitamin D (CALTRATE 600+D PO), Take 1 tablet  by mouth daily., Disp: , Rfl:  .  Cholecalciferol (VITAMIN D3) 1000 units CAPS, Take 1,000 Units by mouth daily. , Disp: , Rfl:  .  Coenzyme Q10 (COQ10) 100 MG CAPS, Take 100 mg by mouth daily., Disp: , Rfl:  .  Glucosamine-MSM-Hyaluronic Acd (JOINT HEALTH PO), Take 1 tablet by mouth 2 (two) times a day. , Disp: , Rfl:  .  levothyroxine (SYNTHROID) 100 MCG tablet, Take 100 mcg by mouth daily before breakfast. , Disp: , Rfl:  .  mirtazapine (REMERON) 7.5 MG tablet, Take 7.5 mg by mouth at bedtime., Disp: , Rfl:  .  omeprazole (PRILOSEC) 40 MG capsule, Take 40 mg by mouth daily., Disp: , Rfl:  .  ondansetron (ZOFRAN) 4 MG tablet, Take 1 tablet (4 mg total) by mouth every 8 (eight) hours as needed for nausea or vomiting. (Patient not taking: Reported on 08/11/2018), Disp: 90 tablet, Rfl: 0 No current  facility-administered medications for this visit.   Facility-Administered Medications Ordered in Other Visits:  .  sodium chloride flush (NS) 0.9 % injection 10 mL, 10 mL, Intravenous, Once, Sindy Guadeloupe, MD  Physical exam:  Vitals:   08/25/18 0844  BP: 128/64  Pulse: (!) 118  Resp: 18  Temp: 98.9 F (37.2 C)  Weight: 100 lb 11.2 oz (45.7 kg)   Physical Exam Constitutional:      Comments: Patient is elderly thin and frail.  Appears in no acute distress  HENT:     Head: Normocephalic and atraumatic.  Eyes:     Pupils: Pupils are equal, round, and reactive to light.  Neck:     Musculoskeletal: Normal range of motion.  Cardiovascular:     Rate and Rhythm: Regular rhythm. Tachycardia present.     Heart sounds: Normal heart sounds.  Pulmonary:     Effort: Pulmonary effort is normal.     Breath sounds: Normal breath sounds.  Abdominal:     General: Bowel sounds are normal.     Palpations: Abdomen is soft.  Skin:    General: Skin is warm and dry.  Neurological:     Mental Status: She is alert and oriented to person, place, and time.      CMP Latest Ref Rng & Units 08/25/2018  Glucose 70 - 99 mg/dL 176(H)  BUN 8 - 23 mg/dL 26(H)  Creatinine 0.44 - 1.00 mg/dL 0.79  Sodium 135 - 145 mmol/L 139  Potassium 3.5 - 5.1 mmol/L 3.6  Chloride 98 - 111 mmol/L 101  CO2 22 - 32 mmol/L 27  Calcium 8.9 - 10.3 mg/dL 9.1  Total Protein 6.5 - 8.1 g/dL 7.0  Total Bilirubin 0.3 - 1.2 mg/dL 0.4  Alkaline Phos 38 - 126 U/L 83  AST 15 - 41 U/L 26  ALT 0 - 44 U/L 20   CBC Latest Ref Rng & Units 08/25/2018  WBC 4.0 - 10.5 K/uL 6.0  Hemoglobin 12.0 - 15.0 g/dL 10.5(L)  Hematocrit 36.0 - 46.0 % 32.5(L)  Platelets 150 - 400 K/uL 199     Assessment and plan- Patient is a 85 y.o. female with recurrent ovarian carcinoma.  She is here for on treatment assessment prior to cycle 5 of weekly carboplatin and Taxol chemotherapy  Counts okay to proceed with cycle 4 of weekly carboplatin and Taxol  chemotherapy today  We discussed considering low-dose Zyprexa 5 mg nightly to see if it helps her appetite and may also help her with nausea.  She is willing to try it.  Return to clinic in 1 week.  Port labs: CBC with differential, CMP, Ca1 25.  Gets carbotaxol chemotherapy CT chest abdomen and pelvis with contrast 10 days from now Return to clinic in 2 weeks.  Port labs: CBC with differential, CMP.  See Dr. Janese Banks.  Gets carbotaxol chemotherapy    Visit Diagnosis 1. Malignant neoplasm of ovary, unspecified laterality ([place])   2. Encounter for antineoplastic chemotherapy      Dr. Randa Evens, MD, MPH [facility] [phone] 08/25/2018 9:07 AM

## 2018-08-25 NOTE — Progress Notes (Signed)
Cramping in arms and hands.  Reports not eating much due to loss of appetite and has lost 2 since last week.  Patient did have some diarrhea the day after treatment last week and did not need any medication.

## 2018-08-25 NOTE — Progress Notes (Signed)
[place]  Telephone:([phone] [phone]   Name: Natalie Stone Date: 08/25/2018 MRN: [id number]  DOB: 30-Aug-1926  Patient Care Team: Rusty Aus, MD as PCP - General (Internal Medicine) Clent Jacks, RN as Registered Nurse    REASON FOR CONSULTATION: Palliative Care consult requested for this 85 y.o. female with multiple medical problems including recurrent ovarian cancer status post neoadjuvant chemotherapy and surgery in 2013.  Patient has been followed by Gyn Onc.  She was hospitalized at [facility] 07/05/2018-07/13/2018 with abdominal pain and melena and was found to have hydronephrosis of the left kidney and increasing CA-125 with CT demonstrating carcinomatosis and a pelvic mass concerning for recurrence.  Patient is status post left ureteral stent on 5/20.  She has been referred back to outpatient medical oncology/GYN oncology.  Patient is also been referred to palliative care for support.  SOCIAL HISTORY:     reports that she has never smoked. She has never used smokeless tobacco. She reports that she does not drink alcohol or use drugs.  Patient is recently widowed having lost her husband in March 2020.  She lives at home with her daughter.  She also has a granddaughter who lives nearby and is involved in her care.  Her granddaughter is an OB/GYN.    Patient formally worked as a Pharmacist, hospital.  She lived in the [place] area for over 40 years and moved here several years ago to be near family.  Patient lives in the independent living section of [place].  ADVANCE DIRECTIVES:  Cardiopulmonary Resuscitation: Do Not Attempt Resuscitation (DNR/No CPR)  Medical Interventions: Limited Additional Interventions: Use medical treatment, IV fluids and cardiac monitoring as indicated, DO NOT USE intubation or mechanical ventilation. May consider use of less invasive airway support such as BiPAP or CPAP. Also provide comfort measures. Transfer  to the hospital if indicated. Avoid intensive care.   Antibiotics: Antibiotics if indicated  IV Fluids: IV fluids for a defined trial period  Feeding Tube: No feeding tube    CODE STATUS: DNR  PAST MEDICAL HISTORY: Past Medical History:  Diagnosis Date   CAD (coronary artery disease)    Chronic kidney disease    urinary incontinence   Hyperthyroidism    Hypothyroidism (acquired)    Ovarian cancer ([place]) 2014   Shingles    Third degree AV block ([place]) 10/09/2015    PAST SURGICAL HISTORY:  Past Surgical History:  Procedure Laterality Date   ABDOMINAL HYSTERECTOMY     BLADDER REPAIR     CORONARY ANGIOPLASTY WITH STENT PLACEMENT  2001   Dr. Isaiah Blakes in [place]   CYSTOURETHROSCOPY, [place] (EG,GIBBONS OR DOUBLE-J TYPE) (Left Ureter Left    PACEMAKER INSERTION N/A 10/11/2015   Procedure: INSERTION PACEMAKER;  Surgeon: [name], MD;  Location: [facility] ORS;  Service: Cardiovascular;  Laterality: N/A;   PARTIAL HYSTERECTOMY     PORTA CATH INSERTION N/A 07/24/2018   Procedure: PORTA CATH INSERTION;  Surgeon: Algernon Huxley, MD;  Location: [facility];  Service: Cardiovascular;  Laterality: N/A;    HEMATOLOGY/ONCOLOGY HISTORY:  Oncology History Overview Note  # NOV 2013-RIGHT OVARIAN CANCER-HIGH GRADE SEROUS CA 125-996 STAGE III [Dr.Stephanie [facility]];Jan 2014- neo-adj chemo x3 M;April 2014 TAH & BSO ; chemo x 79M [finished C-6; July 2014]. Ca 125 -13; N  # GENETIC TESTING-"OncoGene Dx"-NEG  # May-June 2020- Recurrent adenocarcinoma [ascites s/p para; [facility]] [phone].   # June 8th 2020-  Carbo AUC 2  # may 2020- Left hydro-ureteral -n ephrosis [s/p stenting]; diarrhea-? malignancy s/p sigmoidscopy  [[facility]]  CT- showed severe left hydro ureteral nephrosis.  Sigmoid wall thickening differential includes carcinomatosis and colitis.  She did had a repeat scan on 07/09/2018 which showed interval increase in  ascites mostly within the pelvis with associated enhancement of the peritoneum suggesting carcinomatosis.  Redemonstration of sigmoid wall thickening which may represent colitis versus carcinomatosis.  Delayed left nephrogram with severe left hydro-ureteronephrosis similar to prior.  Given findings of colitis as well as ongoing symptoms of melena and diarrhea patient underwent EGD as well as flexible sigmoidoscopy which showed Schatzki's ring and diverticulosis but no evidence of tumor.  Her symptoms are possibly secondary to carcinomatosis.  She also had other stool studies done including C. difficile and stool cultures which were negative.Patient did undergo a diagnostic tap of her ascites on 07/09/2018 which was consistent with adenocarcinoma  S/p pacemaker [Dr.Parascholes] --------------------------------------   DIAGNOSIS: recurrent ovarain cancer  STAGE:    IV  ;GOALS: pallaitive  CURRENT/MOST RECENT THERAPY: weekly carbo-Taxol     Malignant neoplasm of ovary ([place])  11/23/2015 Initial Diagnosis   Ovarian cancer, unspecified laterality ([place])   07/28/2018 -  Chemotherapy   The patient had palonosetron (ALOXI) injection 0.25 mg, 0.25 mg, Intravenous,  Once, 2 of 6 cycles Administration: 0.25 mg (07/28/2018), 0.25 mg (08/04/2018), 0.25 mg (08/11/2018), 0.25 mg (08/18/2018) CARBOplatin (PARAPLATIN) 110 mg in sodium chloride 0.9 % 100 mL chemo infusion, 110 mg (100 % of original dose 113.4 mg), Intravenous,  Once, 2 of 6 cycles Dose modification:   (original dose 113.4 mg, Cycle 1) Administration: 110 mg (07/28/2018), 110 mg (08/04/2018), 110 mg (08/11/2018), 110 mg (08/18/2018) PACLitaxel (TAXOL) 84 mg in sodium chloride 0.9 % 250 mL chemo infusion (> 34m/m2), 60 mg/m2 = 84 mg (100 % of original dose 60 mg/m2), Intravenous,  Once, 2 of 6 cycles Dose modification: 60 mg/m2 (original dose 60 mg/m2, Cycle 1, Reason: Other (see comments), Comment: weekly) Administration: 84 mg (08/18/2018) fosaprepitant  (EMEND) 150 mg, dexamethasone (DECADRON) 12 mg in sodium chloride 0.9 % 145 mL IVPB, , Intravenous,  Once, 2 of 6 cycles Administration:  (07/28/2018),  (08/04/2018),  (08/11/2018),  (08/18/2018)  for chemotherapy treatment.      ALLERGIES:  is allergic to penicillins and ativan [lorazepam].  MEDICATIONS:  Current Outpatient Medications  Medication Sig Dispense Refill   aspirin EC 81 MG tablet Take 81 mg by mouth daily.     atorvastatin (LIPITOR) 10 MG tablet Take 10 mg by mouth at bedtime.      b complex vitamins tablet Take 1 tablet by mouth daily.     Bioflavonoid Products (ESTER C PO) Take 500 mg by mouth daily.      Calcium Carbonate-Vitamin D (CALTRATE 600+D PO) Take 1 tablet by mouth daily.     Cholecalciferol (VITAMIN D3) 1000 units CAPS Take 1,000 Units by mouth daily.      Coenzyme Q10 (COQ10) 100 MG CAPS Take 100 mg by mouth daily.     Glucosamine-MSM-Hyaluronic Acd (JOINT HEALTH PO) Take 1 tablet by mouth 2 (two) times a day.      levothyroxine (SYNTHROID) 100 MCG tablet Take 100 mcg by mouth daily before breakfast.      mirtazapine (REMERON) 7.5 MG tablet Take 7.5 mg by mouth at bedtime.     OLANZapine (ZYPREXA) 5 MG tablet Take 1 tablet (5 mg total) by mouth at bedtime. 30 tablet 0  omeprazole (PRILOSEC) 40 MG capsule Take 40 mg by mouth daily.     ondansetron (ZOFRAN) 4 MG tablet Take 1 tablet (4 mg total) by mouth every 8 (eight) hours as needed for nausea or vomiting. (Patient not taking: Reported on 08/11/2018) 90 tablet 0   No current facility-administered medications for this visit.    Facility-Administered Medications Ordered in Other Visits  Medication Dose Route Frequency Provider Last Rate Last Dose   CARBOplatin (PARAPLATIN) 110 mg in sodium chloride 0.9 % 100 mL chemo infusion  110 mg Intravenous Once Sindy Guadeloupe, MD       diphenhydrAMINE (BENADRYL) injection 12.5 mg  12.5 mg Intravenous Once Sindy Guadeloupe, MD       famotidine (PEPCID) IVPB 20  mg premix  20 mg Intravenous Once Sindy Guadeloupe, MD       fosaprepitant (EMEND) 150 mg, dexamethasone (DECADRON) 12 mg in sodium chloride 0.9 % 145 mL IVPB   Intravenous Once Sindy Guadeloupe, MD       heparin lock flush 100 unit/mL  500 Units Intracatheter Once PRN Sindy Guadeloupe, MD       PACLitaxel (TAXOL) 84 mg in sodium chloride 0.9 % 250 mL chemo infusion (> 46m/m2)  60 mg/m2 (Treatment Plan Recorded) Intravenous Once RSindy Guadeloupe MD       palonosetron (ALOXI) injection 0.25 mg  0.25 mg Intravenous Once RSindy Guadeloupe MD        VITAL SIGNS: There were no vitals taken for this visit. There were no vitals filed for this visit.  Estimated body mass index is 19.03 kg/m as calculated from the following:   Height as of 08/11/18: 5' 1"  (1.549 m).   Weight as of an earlier encounter on 08/25/18: 100 lb 11.2 oz (45.7 kg).  LABS: CBC:    Component Value Date/Time   WBC 6.0 08/25/2018 0809   HGB 10.5 (L) 08/25/2018 0809   HCT 32.5 (L) 08/25/2018 0809   PLT 199 08/25/2018 0809   MCV 93.7 08/25/2018 0809   NEUTROABS 4.2 08/25/2018 0809   LYMPHSABS 1.4 08/25/2018 0809   MONOABS 0.2 08/25/2018 0809   EOSABS 0.1 08/25/2018 0809   BASOSABS 0.0 08/25/2018 0809   Comprehensive Metabolic Panel:    Component Value Date/Time   NA 139 08/25/2018 0809   K 3.6 08/25/2018 0809   CL 101 08/25/2018 0809   CO2 27 08/25/2018 0809   BUN 26 (H) 08/25/2018 0809   CREATININE 0.79 08/25/2018 0809   GLUCOSE 176 (H) 08/25/2018 0809   CALCIUM 9.1 08/25/2018 0809   AST 26 08/25/2018 0809   ALT 20 08/25/2018 0809   ALKPHOS 83 08/25/2018 0809   BILITOT 0.4 08/25/2018 0809   PROT 7.0 08/25/2018 0809   ALBUMIN 3.5 08/25/2018 0809    RADIOGRAPHIC STUDIES: No results found.  PERFORMANCE STATUS (ECOG) : 1 - Symptomatic but completely ambulatory  Review of Systems  Constitutional: Positive for fatigue (Mild) and unexpected weight change. Negative for activity change, appetite change, chills and  fever.  HENT: Negative for dental problem, mouth sores and trouble swallowing.   Eyes: Negative for pain and redness.  Respiratory: Negative for cough and shortness of breath.   Cardiovascular: Negative for chest pain and leg swelling.  Gastrointestinal: Negative for abdominal pain, anal bleeding, constipation, diarrhea, nausea and vomiting.  Endocrine: Negative for cold intolerance and heat intolerance.  Genitourinary: Negative for decreased urine volume, difficulty urinating, dysuria, flank pain, frequency, genital sores, pelvic pain and urgency.  Musculoskeletal: Negative for arthralgias, gait problem and myalgias.  Skin: Negative for rash and wound.  Neurological: Negative for dizziness, weakness, light-headedness and headaches.  Hematological: Negative for adenopathy.  Psychiatric/Behavioral: Negative for confusion and sleep disturbance. The patient is not nervous/anxious.   All other systems reviewed and are negative.    Physical Exam Constitutional:      General: She is not in acute distress.    Comments: Thin built, Frail, elderly  HENT:     Mouth/Throat:     Mouth: Mucous membranes are moist.     Pharynx: Oropharynx is clear.  Eyes:     General: No scleral icterus.    Conjunctiva/sclera: Conjunctivae normal.  Cardiovascular:     Rate and Rhythm: Normal rate and regular rhythm.  Pulmonary:     Effort: Pulmonary effort is normal.     Breath sounds: Normal breath sounds.  Abdominal:     Palpations: Abdomen is soft.     Tenderness: There is no abdominal tenderness.  Skin:    General: Skin is warm and dry.     Findings: No rash.  Neurological:     Mental Status: She is alert and oriented to person, place, and time.  Psychiatric:        Mood and Affect: Mood normal.        Behavior: Behavior normal.     IMPRESSION: I met with patient today while she was receiving her infusion of carbo-Taxol.  She saw Dr. Janese Banks today for consideration of treatment.  She says that she  is doing reasonably well without significant changes or concerns.  Appetite is reduced and she continues to supplement with nutritional shakes.  She expresses concern that she has not been able to increase her weight.  She is followed by Jennet Maduro, RD.   She denies pain or ascites.  No numbness or tingling in her extremities.  She experienced mild nausea after her last treatment.  Dr. Janese Banks plans to add Zyprexa nightly to help appetite and possibly nausea.  Overall she feels she is tolerating her treatment well.   Advance care planning discussion was previously had.  In review, patient is DNR, limited additional interventions, antibiotics if indicated, IV fluids for defined trial.,  No feeding tube.  She would like to engage with hospice when it is appropriate as a means of ensuring she is comfortable and that her family is well supported.  She previously had good experience with hospice services prior to her husband's passing.  PLAN: -Continue current scope of treatment. -Increase oral supplements to TID.  - Proceed with zyprexa per Dr. Janese Banks - 09/01/2018- palliative care f/u in infusion  Patient expressed understanding and was in agreement with this plan. She also understands that She can call the clinic at any time with any questions, concerns, or complaints.    Time Total: 15 minutes  Visit consisted of counseling and education dealing with the complex and emotionally intense issues of symptom management and palliative care in the setting of serious and potentially life-threatening illness.Greater than 50%  of this time was spent counseling and coordinating care related to the above assessment and plan.  Signed by: Beckey Rutter, DNP, AGNP-C [place] [facility] [place] (work cell) [phone] (office)  CC: Billey Chang, NP

## 2018-08-25 NOTE — Progress Notes (Signed)
Dr.Rao is aware of patient HR of 103. Per Dr. Janese Banks it is ok to proceed treatment today.

## 2018-08-26 DIAGNOSIS — C561 Malignant neoplasm of right ovary: Secondary | ICD-10-CM | POA: Diagnosis not present

## 2018-08-26 DIAGNOSIS — Z7982 Long term (current) use of aspirin: Secondary | ICD-10-CM | POA: Diagnosis not present

## 2018-08-26 DIAGNOSIS — N1330 Unspecified hydronephrosis: Secondary | ICD-10-CM | POA: Diagnosis not present

## 2018-08-26 DIAGNOSIS — Z950 Presence of cardiac pacemaker: Secondary | ICD-10-CM | POA: Diagnosis not present

## 2018-08-26 DIAGNOSIS — Z90710 Acquired absence of both cervix and uterus: Secondary | ICD-10-CM | POA: Diagnosis not present

## 2018-08-26 DIAGNOSIS — Z79899 Other long term (current) drug therapy: Secondary | ICD-10-CM | POA: Diagnosis not present

## 2018-08-26 DIAGNOSIS — R530 Neoplastic (malignant) related fatigue: Secondary | ICD-10-CM | POA: Diagnosis not present

## 2018-08-26 LAB — CA 125: Cancer Antigen (CA) 125: 623 U/mL — ABNORMAL HIGH (ref 0.0–38.1)

## 2018-08-27 DIAGNOSIS — C561 Malignant neoplasm of right ovary: Secondary | ICD-10-CM | POA: Diagnosis not present

## 2018-08-27 DIAGNOSIS — R530 Neoplastic (malignant) related fatigue: Secondary | ICD-10-CM | POA: Diagnosis not present

## 2018-08-27 DIAGNOSIS — Z79899 Other long term (current) drug therapy: Secondary | ICD-10-CM | POA: Diagnosis not present

## 2018-08-27 DIAGNOSIS — Z90710 Acquired absence of both cervix and uterus: Secondary | ICD-10-CM | POA: Diagnosis not present

## 2018-08-27 DIAGNOSIS — Z7982 Long term (current) use of aspirin: Secondary | ICD-10-CM | POA: Diagnosis not present

## 2018-08-27 DIAGNOSIS — Z950 Presence of cardiac pacemaker: Secondary | ICD-10-CM | POA: Diagnosis not present

## 2018-08-27 DIAGNOSIS — N1330 Unspecified hydronephrosis: Secondary | ICD-10-CM | POA: Diagnosis not present

## 2018-09-01 ENCOUNTER — Inpatient Hospital Stay: Payer: Medicare HMO

## 2018-09-01 ENCOUNTER — Inpatient Hospital Stay (HOSPITAL_BASED_OUTPATIENT_CLINIC_OR_DEPARTMENT_OTHER): Payer: Medicare HMO | Admitting: Nurse Practitioner

## 2018-09-01 ENCOUNTER — Other Ambulatory Visit: Payer: Self-pay | Admitting: Oncology

## 2018-09-01 ENCOUNTER — Other Ambulatory Visit: Payer: Self-pay

## 2018-09-01 ENCOUNTER — Inpatient Hospital Stay: Payer: Medicare HMO | Admitting: Nurse Practitioner

## 2018-09-01 VITALS — BP 119/65 | HR 88 | Temp 99.8°F | Resp 18

## 2018-09-01 DIAGNOSIS — N189 Chronic kidney disease, unspecified: Secondary | ICD-10-CM | POA: Diagnosis not present

## 2018-09-01 DIAGNOSIS — T451X5A Adverse effect of antineoplastic and immunosuppressive drugs, initial encounter: Secondary | ICD-10-CM | POA: Insufficient documentation

## 2018-09-01 DIAGNOSIS — Z515 Encounter for palliative care: Secondary | ICD-10-CM | POA: Diagnosis not present

## 2018-09-01 DIAGNOSIS — D701 Agranulocytosis secondary to cancer chemotherapy: Secondary | ICD-10-CM | POA: Diagnosis not present

## 2018-09-01 DIAGNOSIS — E43 Unspecified severe protein-calorie malnutrition: Secondary | ICD-10-CM | POA: Diagnosis not present

## 2018-09-01 DIAGNOSIS — C569 Malignant neoplasm of unspecified ovary: Secondary | ICD-10-CM | POA: Diagnosis not present

## 2018-09-01 DIAGNOSIS — I2510 Atherosclerotic heart disease of native coronary artery without angina pectoris: Secondary | ICD-10-CM | POA: Diagnosis not present

## 2018-09-01 DIAGNOSIS — D649 Anemia, unspecified: Secondary | ICD-10-CM

## 2018-09-01 DIAGNOSIS — R971 Elevated cancer antigen 125 [CA 125]: Secondary | ICD-10-CM | POA: Diagnosis not present

## 2018-09-01 DIAGNOSIS — Z90722 Acquired absence of ovaries, bilateral: Secondary | ICD-10-CM | POA: Diagnosis not present

## 2018-09-01 DIAGNOSIS — E039 Hypothyroidism, unspecified: Secondary | ICD-10-CM | POA: Diagnosis not present

## 2018-09-01 DIAGNOSIS — R630 Anorexia: Secondary | ICD-10-CM | POA: Diagnosis not present

## 2018-09-01 DIAGNOSIS — Z90710 Acquired absence of both cervix and uterus: Secondary | ICD-10-CM | POA: Diagnosis not present

## 2018-09-01 LAB — COMPREHENSIVE METABOLIC PANEL
ALT: 14 U/L (ref 0–44)
AST: 20 U/L (ref 15–41)
Albumin: 3.3 g/dL — ABNORMAL LOW (ref 3.5–5.0)
Alkaline Phosphatase: 66 U/L (ref 38–126)
Anion gap: 6 (ref 5–15)
BUN: 23 mg/dL (ref 8–23)
CO2: 26 mmol/L (ref 22–32)
Calcium: 8.8 mg/dL — ABNORMAL LOW (ref 8.9–10.3)
Chloride: 105 mmol/L (ref 98–111)
Creatinine, Ser: 0.82 mg/dL (ref 0.44–1.00)
GFR calc Af Amer: >60 mL/min (ref >60)
GFR calc non Af Amer: >60 mL/min (ref >60)
Glucose, Bld: 167 mg/dL — ABNORMAL HIGH (ref 70–99)
Potassium: 3.3 mmol/L — ABNORMAL LOW (ref 3.5–5.1)
Sodium: 137 mmol/L (ref 135–145)
Total Bilirubin: 0.5 mg/dL (ref 0.3–1.2)
Total Protein: 7.1 g/dL (ref 6.5–8.1)

## 2018-09-01 LAB — CBC WITH DIFFERENTIAL/PLATELET
Abs Immature Granulocytes: 0.01 10*3/uL (ref 0.00–0.07)
Basophils Absolute: 0 10*3/uL (ref 0.0–0.1)
Basophils Relative: 1 %
Eosinophils Absolute: 0.1 10*3/uL (ref 0.0–0.5)
Eosinophils Relative: 2 %
HCT: 30.9 % — ABNORMAL LOW (ref 36.0–46.0)
Hemoglobin: 10.1 g/dL — ABNORMAL LOW (ref 12.0–15.0)
Immature Granulocytes: 0 %
Lymphocytes Relative: 35 %
Lymphs Abs: 1.2 10*3/uL (ref 0.7–4.0)
MCH: 30.4 pg (ref 26.0–34.0)
MCHC: 32.7 g/dL (ref 30.0–36.0)
MCV: 93.1 fL (ref 80.0–100.0)
Monocytes Absolute: 0.2 10*3/uL (ref 0.1–1.0)
Monocytes Relative: 7 %
Neutro Abs: 1.8 10*3/uL (ref 1.7–7.7)
Neutrophils Relative %: 55 %
Platelets: 242 10*3/uL (ref 150–400)
RBC: 3.32 MIL/uL — ABNORMAL LOW (ref 3.87–5.11)
RDW: 14.9 % (ref 11.5–15.5)
WBC: 3.3 10*3/uL — ABNORMAL LOW (ref 4.0–10.5)
nRBC: 0 % (ref 0.0–0.2)

## 2018-09-01 MED ORDER — SODIUM CHLORIDE 0.9 % IV SOLN
85.0500 mg | Freq: Once | INTRAVENOUS | Status: AC
  Administered 2018-09-01: 90 mg via INTRAVENOUS
  Filled 2018-09-01: qty 9

## 2018-09-01 MED ORDER — DIPHENHYDRAMINE HCL 50 MG/ML IJ SOLN
12.5000 mg | Freq: Once | INTRAMUSCULAR | Status: AC
  Administered 2018-09-01: 12.5 mg via INTRAVENOUS
  Filled 2018-09-01: qty 1

## 2018-09-01 MED ORDER — SODIUM CHLORIDE 0.9 % IV SOLN
60.0000 mg/m2 | Freq: Once | INTRAVENOUS | Status: AC
  Administered 2018-09-01: 12:00:00 84 mg via INTRAVENOUS
  Filled 2018-09-01: qty 14

## 2018-09-01 MED ORDER — HEPARIN SOD (PORK) LOCK FLUSH 100 UNIT/ML IV SOLN
500.0000 [IU] | Freq: Once | INTRAVENOUS | Status: AC
  Administered 2018-09-01: 14:00:00 500 [IU] via INTRAVENOUS
  Filled 2018-09-01: qty 5

## 2018-09-01 MED ORDER — SODIUM CHLORIDE 0.9 % IV SOLN
Freq: Once | INTRAVENOUS | Status: AC
  Administered 2018-09-01: 13:00:00 via INTRAVENOUS
  Filled 2018-09-01: qty 250

## 2018-09-01 MED ORDER — SODIUM CHLORIDE 0.9% FLUSH
10.0000 mL | Freq: Once | INTRAVENOUS | Status: AC
  Administered 2018-09-01: 10 mL via INTRAVENOUS
  Filled 2018-09-01: qty 10

## 2018-09-01 MED ORDER — HEPARIN SOD (PORK) LOCK FLUSH 100 UNIT/ML IV SOLN
500.0000 [IU] | Freq: Once | INTRAVENOUS | Status: AC | PRN
  Administered 2018-09-01: 14:00:00 500 [IU]
  Filled 2018-09-01: qty 5

## 2018-09-01 MED ORDER — SODIUM CHLORIDE 0.9 % IV SOLN
Freq: Once | INTRAVENOUS | Status: AC
  Administered 2018-09-01: 11:00:00 via INTRAVENOUS
  Filled 2018-09-01: qty 5

## 2018-09-01 MED ORDER — FAMOTIDINE IN NACL 20-0.9 MG/50ML-% IV SOLN
20.0000 mg | Freq: Once | INTRAVENOUS | Status: AC
  Administered 2018-09-01: 20 mg via INTRAVENOUS
  Filled 2018-09-01: qty 50

## 2018-09-01 MED ORDER — SODIUM CHLORIDE 0.9 % IV SOLN
Freq: Once | INTRAVENOUS | Status: AC
  Administered 2018-09-01: 10:00:00 via INTRAVENOUS
  Filled 2018-09-01: qty 250

## 2018-09-01 MED ORDER — PALONOSETRON HCL INJECTION 0.25 MG/5ML
0.2500 mg | Freq: Once | INTRAVENOUS | Status: AC
  Administered 2018-09-01: 0.25 mg via INTRAVENOUS
  Filled 2018-09-01: qty 5

## 2018-09-01 NOTE — Progress Notes (Signed)
Symptom Management [place]  [phone] [phone]  Patient Care Team: Rusty Aus, MD as PCP - General (Internal Medicine) Clent Jacks, RN as Registered Nurse   Name of the patient: Natalie Stone  [id number]  1926/09/02   Date of visit: 09/01/18  Diagnosis-recurrent ovarian cancer  Chief complaint/ Reason for visit-weakness and nausea  Heme/Onc history:  Patient has history significant for stage III ovarian cancer which was treated in [place] in 2013.  She underwent neoadjuvant chemotherapy followed by surgery with Dr. Beverly Milch.  Followed by adjuvant chemotherapy.  Genetic testing was negative.  She establish care at [facility] in 2017.  She was found to have increasing CA-125 began rising.  Scans however, were negative for recurrence.  She was asymptomatic. In May 2020 she developed pain.  Subsequent imaging showed severe left hydro-ureteral nephrosis, sigmoid wall thickening-differential including carcinomatosis and colitis.  Repeat scan on 07/09/2018 showed interval increase in ascites mostly within pelvis with associated enhancement of peritoneum suggesting carcinomatosis, redemonstration of sigmoid wall thickening (colitis versus carcinomatosis), delayed left nephrogram with severe left hydroureteronephrosis similar to prior.  She underwent EGD and flexible sigmoidoscopy which showed Schatzki's ring and diverticulosis but no evidence of tumor.  Her symptoms possibly secondary to carcinomatosis.  She had negative stool studies.  She underwent diagnostic paracentesis of her ascites on 07/09/2018 which was consistent with adenocarcinoma. Dr. Fransisca Connors recommended chemotherapy administration schedule based on MITO-7 data given her advanced age and frailty.  She initiated [place] AUC 2 on 07/28/2018 and received 3 weekly cycles which she tolerated relatively well.  On 08/18/2018 Taxol was added (60 mg/m).   Oncology History Overview Note  #  NOV 2013-RIGHT OVARIAN CANCER-HIGH GRADE SEROUS CA 125-996 STAGE III [[facility]Stephanie [facility]];Jan 2014- neo-adj chemo x3 M;April 2014 TAH & BSO ; chemo x 34M [finished C-6; July 2014]. Ca 125 -13; N  # GENETIC TESTING-"OncoGene Dx"-NEG  # May-June 2020- Recurrent adenocarcinoma [ascites s/p para; [facility]] [phone].   # June 8th 2020- Carbo AUC 2  # may 2020- Left hydro-ureteral -n ephrosis [s/p stenting]; diarrhea-? malignancy s/p sigmoidscopy  [[facility]]  CT- showed severe left hydro ureteral nephrosis.  Sigmoid wall thickening differential includes carcinomatosis and colitis.  She did had a repeat scan on 07/09/2018 which showed interval increase in ascites mostly within the pelvis with associated enhancement of the peritoneum suggesting carcinomatosis.  Redemonstration of sigmoid wall thickening which may represent colitis versus carcinomatosis.  Delayed left nephrogram with severe left hydro-ureteronephrosis similar to prior.   Given findings of colitis as well as ongoing symptoms of melena and diarrhea patient underwent EGD as well as flexible sigmoidoscopy which showed Schatzki's ring and diverticulosis but no evidence of tumor.  Her symptoms are possibly secondary to carcinomatosis.  She also had other stool studies done including C. difficile and stool cultures which were negative.Patient did undergo a diagnostic tap of her ascites on 07/09/2018 which was consistent with adenocarcinoma  S/p pacemaker [Dr.Paraschos] --------------------------------------   DIAGNOSIS: recurrent ovarian cancer  STAGE: IV; GOALS: palliative  CURRENT/MOST RECENT THERAPY: weekly carbo-Taxol     Malignant neoplasm of ovary ([place])  11/23/2015 Initial Diagnosis   Ovarian cancer, unspecified laterality ([place])   07/28/2018 -  Chemotherapy   The patient had palonosetron (ALOXI) injection 0.25 mg, 0.25 mg, Intravenous,  Once, 2 of 6 cycles Administration: 0.25 mg (07/28/2018), 0.25 mg (08/04/2018), 0.25 mg  (08/11/2018), 0.25 mg (08/18/2018), 0.25 mg (08/25/2018) CARBOplatin (PARAPLATIN) 110 mg in sodium chloride  0.9 % 100 mL chemo infusion, 110 mg (100 % of original dose 113.4 mg), Intravenous,  Once, 2 of 6 cycles Dose modification:   (original dose 113.4 mg, Cycle 1) Administration: 110 mg (07/28/2018), 110 mg (08/04/2018), 110 mg (08/11/2018), 110 mg (08/18/2018), 110 mg (08/25/2018) PACLitaxel (TAXOL) 84 mg in sodium chloride 0.9 % 250 mL chemo infusion (> 80mg /m2), 60 mg/m2 = 84 mg (100 % of original dose 60 mg/m2), Intravenous,  Once, 2 of 6 cycles Dose modification: 60 mg/m2 (original dose 60 mg/m2, Cycle 1, Reason: Other (see comments), Comment: weekly) Administration: 84 mg (08/18/2018), 84 mg (08/25/2018) fosaprepitant (EMEND) 150 mg, dexamethasone (DECADRON) 12 mg in sodium chloride 0.9 % 145 mL IVPB, , Intravenous,  Once, 2 of 6 cycles Administration:  (07/28/2018),  (08/04/2018),  (08/11/2018),  (08/18/2018),  (08/25/2018)  for chemotherapy treatment.      Interval history-Natalie Stone, 85 year old female with above history of recurrent ovarian cancer, currently on weekly carboplatin-Taxol chemotherapy, presents to symptom management clinic for weakness and nausea.  Symptoms have been ongoing and slightly worse since most recent chemotherapy.  Her daughter Natalie Stone, participates in visit by phone due to COVID-19 precautions and contributes to history. She denies vomiting.  She has taken Zyprexa once but did not notice significant improvement.  She has not been taking Zofran or Compazine. Denies vomiting. No fever or chills. Appetite continues to be reduced.  She is drinking Or[employer] nutrition shakes to supplement diet. She denies falls. She uses a walker around the house and has a shower chair.   ECOG FS:2 - Symptomatic, <50% confined to bed  Review of systems- Review of Systems  Constitutional: Positive for malaise/fatigue and weight loss. Negative for chills and fever.  HENT: Negative for hearing loss,  nosebleeds, sore throat and tinnitus.   Eyes: Negative for blurred vision and double vision.  Respiratory: Negative for cough, hemoptysis, shortness of breath and wheezing.   Cardiovascular: Negative for chest pain, palpitations and leg swelling.  Gastrointestinal: Positive for nausea. Negative for abdominal pain (pelvic pressure- slightly improved), blood in stool, constipation, diarrhea, melena and vomiting.  Genitourinary: Negative for dysuria and urgency.  Musculoskeletal: Positive for back pain (chronic & unchanged). Negative for falls, joint pain and myalgias.  Skin: Negative for itching and rash.  Neurological: Positive for weakness. Negative for dizziness, tingling, sensory change, loss of consciousness and headaches.  Endo/Heme/Allergies: Negative for environmental allergies. Does not bruise/bleed easily.  Psychiatric/Behavioral: Negative for depression. The patient is not nervous/anxious and does not have insomnia.     Current treatment Weekly carbo-Taxol  Allergies  Allergen Reactions  . Penicillins Anaphylaxis    Has patient had a PCN reaction causing immediate rash, facial/tongue/throat swelling, SOB or lightheadedness with hypotension: Yes Has patient had a PCN reaction causing severe rash involving mucus membranes or skin necrosis: No Has patient had a PCN reaction that required hospitalization No Has patient had a PCN reaction occurring within the last 10 years: No If all of the above answers are "NO", then may proceed with Cephalosporin use.  . Ativan [Lorazepam] Other (See Comments)    Made her not remember for many hours.    Past Medical History:  Diagnosis Date  . CAD (coronary artery disease)   . Chronic kidney disease    urinary incontinence  . Hyperthyroidism   . Hypothyroidism (acquired)   . Ovarian cancer ([place]) 2014  . Shingles   . Third degree AV block ([place]) 10/09/2015    Past Surgical History:  Procedure Laterality Date  .  ABDOMINAL HYSTERECTOMY    .  BLADDER REPAIR    . CORONARY ANGIOPLASTY WITH STENT PLACEMENT  2001   Dr. Isaiah Blakes in [place]  . CYSTOURETHROSCOPY, WITH INSERTION OF INDWELLING URETERAL STENT (EG,GIBBONS OR DOUBLE-J TYPE) (Left Ureter Left   . PACEMAKER INSERTION N/A 10/11/2015   Procedure: INSERTION PACEMAKER;  Surgeon: [name], MD;  Location: [facility] ORS;  Service: Cardiovascular;  Laterality: N/A;  . PARTIAL HYSTERECTOMY    . PORTA CATH INSERTION N/A 07/24/2018   Procedure: PORTA CATH INSERTION;  Surgeon: Algernon Huxley, MD;  Location: [facility];  Service: Cardiovascular;  Laterality: N/A;    Social History   Socioeconomic History  . Marital status: Widowed    Spouse name: Not on file  . Number of children: Not on file  . Years of education: Not on file  . Highest education level: Not on file  Occupational History  . Not on file  Social Needs  . Financial resource strain: Not on file  . Food insecurity    Worry: Not on file    Inability: Not on file  . Transportation needs    Medical: Not on file    Non-medical: Not on file  Tobacco Use  . Smoking status: Never Smoker  . Smokeless tobacco: Never Used  Substance and Sexual Activity  . Alcohol use: No  . Drug use: No  . Sexual activity: Never  Lifestyle  . Physical activity    Days per week: Not on file    Minutes per session: Not on file  . Stress: Not on file  Relationships  . Social Herbalist on phone: Not on file    Gets together: Not on file    Attends religious service: Not on file    Active member of club or organization: Not on file    Attends meetings of clubs or organizations: Not on file    Relationship status: Not on file  . Intimate partner violence    Fear of current or ex partner: Not on file    Emotionally abused: Not on file    Physically abused: Not on file    Forced sexual activity: Not on file  Other Topics Concern  . 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 Diabetes Brother      Current Outpatient Medications:  .  aspirin EC 81 MG tablet, Take 81 mg by mouth daily., Disp: , Rfl:  .  atorvastatin (LIPITOR) 10 MG tablet, Take 10 mg by mouth at bedtime. , Disp: , Rfl:  .  b complex vitamins tablet, Take 1 tablet by mouth daily., Disp: , Rfl:  .  Bioflavonoid Products (ESTER C PO), Take 500 mg by mouth daily. , Disp: , Rfl:  .  Calcium Carbonate-Vitamin D (CALTRATE 600+D PO), Take 1 tablet by mouth daily., Disp: , Rfl:  .  Cholecalciferol (VITAMIN D3) 1000 units CAPS, Take 1,000 Units by mouth daily. , Disp: , Rfl:  .  Coenzyme Q10 (COQ10) 100 MG CAPS, Take 100 mg by mouth daily., Disp: , Rfl:  .  Glucosamine-MSM-Hyaluronic Acd (JOINT HEALTH PO), Take 1 tablet by mouth 2 (two) times a day. , Disp: , Rfl:  .  levothyroxine (SYNTHROID) 100 MCG tablet, Take 100 mcg by mouth daily before breakfast. , Disp: , Rfl:  .  mirtazapine (  REMERON) 7.5 MG tablet, Take 7.5 mg by mouth at bedtime., Disp: , Rfl:  .  OLANZapine (ZYPREXA) 5 MG tablet, Take 1 tablet (5 mg total) by mouth at bedtime., Disp: 30 tablet, Rfl: 0 .  omeprazole (PRILOSEC) 40 MG capsule, Take 40 mg by mouth daily., Disp: , Rfl:  .  ondansetron (ZOFRAN) 4 MG tablet, Take 1 tablet (4 mg total) by mouth every 8 (eight) hours as needed for nausea or vomiting. (Patient not taking: Reported on 08/11/2018), Disp: 90 tablet, Rfl: 0 No current facility-administered medications for this visit.   Facility-Administered Medications Ordered in Other Visits:  .  0.9 %  sodium chloride infusion, , Intravenous, Once, Sindy Guadeloupe, MD .  CARBOplatin (PARAPLATIN) 90 mg in sodium chloride 0.9 % 100 mL chemo infusion, 90 mg, Intravenous, Once, Sindy Guadeloupe, MD .  diphenhydrAMINE (BENADRYL) injection 12.5 mg, 12.5 mg, Intravenous, Once, Sindy Guadeloupe, MD .  famotidine (PEPCID) IVPB 20 mg premix, 20 mg, Intravenous, Once,  Sindy Guadeloupe, MD .  fosaprepitant (EMEND) 150 mg, dexamethasone (DECADRON) 12 mg in sodium chloride 0.9 % 145 mL IVPB, , Intravenous, Once, Sindy Guadeloupe, MD .  heparin lock flush 100 unit/mL, 500 Units, Intravenous, Once, Sindy Guadeloupe, MD .  heparin lock flush 100 unit/mL, 500 Units, Intracatheter, Once PRN, Sindy Guadeloupe, MD .  PACLitaxel (TAXOL) 84 mg in sodium chloride 0.9 % 250 mL chemo infusion (> 80mg /m2), 60 mg/m2 (Treatment Plan Recorded), Intravenous, Once, Sindy Guadeloupe, MD .  palonosetron (ALOXI) injection 0.25 mg, 0.25 mg, Intravenous, Once, Sindy Guadeloupe, MD  Physical exam: There were no vitals filed for this visit. Physical Exam Constitutional:      Appearance: She is well-developed.     Comments: Thin built, elderly female, seen in infusion suite, receiving fluids prior to chemotherapy  HENT:     Head: Atraumatic.     Nose: Nose normal.     Mouth/Throat:     Pharynx: No oropharyngeal exudate.  Eyes:     General: No scleral icterus.    Conjunctiva/sclera: Conjunctivae normal.     Pupils: Pupils are equal, round, and reactive to light.  Neck:     Musculoskeletal: Normal range of motion and neck supple.  Cardiovascular:     Rate and Rhythm: Regular rhythm. Tachycardia present.  Pulmonary:     Effort: Pulmonary effort is normal.     Breath sounds: Normal breath sounds.  Abdominal:     General: Bowel sounds are normal. There is no distension.     Palpations: Abdomen is soft.  Musculoskeletal: Normal range of motion.  Skin:    General: Skin is warm and dry.  Neurological:     Mental Status: She is alert and oriented to person, place, and time.  Psychiatric:        Mood and Affect: Mood normal.        Behavior: Behavior normal.     CMP Latest Ref Rng & Units 09/01/2018  Glucose 70 - 99 mg/dL 167(H)  BUN 8 - 23 mg/dL 23  Creatinine 0.44 - 1.00 mg/dL 0.82  Sodium 135 - 145 mmol/L 137  Potassium 3.5 - 5.1 mmol/L 3.3(L)  Chloride 98 - 111 mmol/L 105  CO2 22  - 32 mmol/L 26  Calcium 8.9 - 10.3 mg/dL 8.8(L)  Total Protein 6.5 - 8.1 g/dL 7.1  Total Bilirubin 0.3 - 1.2 mg/dL 0.5  Alkaline Phos 38 - 126 U/L 66  AST 15 - 41 U/L  20  ALT 0 - 44 U/L 14   CBC Latest Ref Rng & Units 09/01/2018  WBC 4.0 - 10.5 K/uL 3.3(L)  Hemoglobin 12.0 - 15.0 g/dL 10.1(L)  Hematocrit 36.0 - 46.0 % 30.9(L)  Platelets 150 - 400 K/uL 242    No images are attached to the encounter.  No results found.  Assessment and plan- Patient is a 85 y.o. female with recurrent ovarian carcinoma presents to symptom management clinic for weakness and nausea and assessment prior to cycle 6 of weekly carbo-Taxol chemotherapy.  1.  Recurrent ovarian carcinoma-currently receiving weekly carboplatin and paclitaxel based on MITO-7 data given increased frailty and age. CA 125 was 1326 a month ago; 623 last week. Pending today. Symptomatically improved pelvic pain and bloating. Patient eager to proceed with treatment today. Discussed with Dr. Janese Banks today who recommends adjusting carboplatin AUC to 1.5 based on labs. CT scans later this week to evaluate.   2. Chemotherapy induced neutropenia- wbc 3.3, anc 1.8 today. Since she is getting treatment today I would expect her counts to drop again. Discussed with Dr. Janese Banks who recommends initiating neupogen. Awaiting insurance approval.   3. Chemotherapy induced nausea- encouraged use of anti-emetics regularly to control symptoms. Nausea aggravated by food but given weakness and weight loss I encouraged use of zyprexa and zofran to control symptoms and allow for intake. Continue zyprexa and zofran as prescribed.   4. Anemia- normocytic and I suspect secondary to chemotherapy and chronic disease. Iron studies on 07/06/2018 consistent with this. Continue to monitor.   5. Weakness & Malnutrition- likely secondary to poor oral intake and fluid deficit as evidenced by tachycardia. IV fluids today prior to infusion. Encouraged increase intake of nutritional  shakes and small frequent meals. On zyprexa. Followed by Maryln Gottron. Will have her return to clinic in 3 days for labs and re-evaluation in Symptom Management. Will also order wheelchair and bedside commode. Samples of ensure hydration solution provided today.   Patient suffers from recurrent ovarian cancer, which impairs her ability to perform daily activities like toileting, feeding, dressing, grooming, and/or bathing in the home. A (cane, walker, and/or crutch) will not resolves issues with performing activities of daily living. A wheelchair will allow patient to safely perform daily activities. Patient is not able to propel herself in the home using a standard weight wheelchair due to general weakness and loss of endurance. Patient can self propel in the lightweight wheelchair.   RTC on 7/15 for Neupogen/biosimilar. On 7/16- cbc, cmp and follow up in Symptom Management Clinic for re-evation and possible neupogen.    Visit Diagnosis 1. Malignant neoplasm of ovary, unspecified laterality ([place])   2. Chemotherapy induced neutropenia ([facility])   3. Normocytic anemia   4. Severe protein-calorie malnutrition ([place])     Patient expressed understanding and was in agreement with this plan. She also understands that She can call clinic at any time with any questions, concerns, or complaints.   Thank you for allowing me to participate in the care of this very pleasant patient.   Beckey Rutter, DNP, AGNP-C [place] at [facility] [phone] (work cell) [phone] (office)  CC: Dr. Janese Banks

## 2018-09-01 NOTE — Progress Notes (Signed)
Nutrition Follow-up:  Patient with recurrent ovarian cancer currently on chemotherapy.    Spoke with patient during infusion this am.  Patient reports that her appetite has been decreased.  Reports that she typically drinks the orgain shakes 1 1/2 per day but noted on food diary only have been drinking 1/2 daily.  Reports that she has had nausea and NP addressing that today with medications.  Typically has been eating rice rispies/bran flakes cereal with fruit for breakfast and 1/2 shake.  Has been eating homemade custard and cottage cheese.  Drinks whole milk.  Typically has been getting in only 2 meals per day and small volume.      Medications: zyprexa added  Labs: glucose 167, K 3.3  Anthropometrics:   Weight 100 lb 11.2 oz on 7/6 decreased from last weight of 102 lb 4.8 oz on 6/29   NUTRITION DIAGNOSIS: Inadequate oral intake decreased   INTERVENTION:  Encouraged patient to continue to eat high calorie high protein foods q 2 hours.   Encouraged taking nausea medications as ordered to prevent nausea and allow patient to consume more calories Encouraged oral nutrition supplement at least BID    MONITORING, EVALUATION, GOAL: Patient will increase calories and protein to prevent further weight loss   NEXT VISIT: to be determined  Merle Cirelli B. Zenia Resides, [place], [place] Registered Dietitian [phone] (pager)

## 2018-09-02 ENCOUNTER — Inpatient Hospital Stay: Payer: Medicare HMO

## 2018-09-02 DIAGNOSIS — N1330 Unspecified hydronephrosis: Secondary | ICD-10-CM | POA: Diagnosis not present

## 2018-09-02 DIAGNOSIS — C561 Malignant neoplasm of right ovary: Secondary | ICD-10-CM | POA: Diagnosis not present

## 2018-09-02 DIAGNOSIS — Z950 Presence of cardiac pacemaker: Secondary | ICD-10-CM | POA: Diagnosis not present

## 2018-09-02 DIAGNOSIS — Z7982 Long term (current) use of aspirin: Secondary | ICD-10-CM | POA: Diagnosis not present

## 2018-09-02 DIAGNOSIS — Z90710 Acquired absence of both cervix and uterus: Secondary | ICD-10-CM | POA: Diagnosis not present

## 2018-09-02 DIAGNOSIS — R530 Neoplastic (malignant) related fatigue: Secondary | ICD-10-CM | POA: Diagnosis not present

## 2018-09-02 DIAGNOSIS — Z79899 Other long term (current) drug therapy: Secondary | ICD-10-CM | POA: Diagnosis not present

## 2018-09-02 LAB — CA 125: Cancer Antigen (CA) 125: 271 U/mL — ABNORMAL HIGH (ref 0.0–38.1)

## 2018-09-03 ENCOUNTER — Other Ambulatory Visit: Payer: Self-pay

## 2018-09-03 ENCOUNTER — Telehealth: Payer: Self-pay | Admitting: *Deleted

## 2018-09-03 ENCOUNTER — Inpatient Hospital Stay: Payer: Medicare HMO

## 2018-09-03 DIAGNOSIS — Z90722 Acquired absence of ovaries, bilateral: Secondary | ICD-10-CM | POA: Diagnosis not present

## 2018-09-03 DIAGNOSIS — R971 Elevated cancer antigen 125 [CA 125]: Secondary | ICD-10-CM | POA: Diagnosis not present

## 2018-09-03 DIAGNOSIS — N189 Chronic kidney disease, unspecified: Secondary | ICD-10-CM | POA: Diagnosis not present

## 2018-09-03 DIAGNOSIS — T451X5A Adverse effect of antineoplastic and immunosuppressive drugs, initial encounter: Secondary | ICD-10-CM

## 2018-09-03 DIAGNOSIS — R630 Anorexia: Secondary | ICD-10-CM | POA: Diagnosis not present

## 2018-09-03 DIAGNOSIS — E039 Hypothyroidism, unspecified: Secondary | ICD-10-CM | POA: Diagnosis not present

## 2018-09-03 DIAGNOSIS — Z515 Encounter for palliative care: Secondary | ICD-10-CM | POA: Diagnosis not present

## 2018-09-03 DIAGNOSIS — Z90710 Acquired absence of both cervix and uterus: Secondary | ICD-10-CM | POA: Diagnosis not present

## 2018-09-03 DIAGNOSIS — I2510 Atherosclerotic heart disease of native coronary artery without angina pectoris: Secondary | ICD-10-CM | POA: Diagnosis not present

## 2018-09-03 DIAGNOSIS — C569 Malignant neoplasm of unspecified ovary: Secondary | ICD-10-CM | POA: Diagnosis not present

## 2018-09-03 DIAGNOSIS — D701 Agranulocytosis secondary to cancer chemotherapy: Secondary | ICD-10-CM

## 2018-09-03 MED ORDER — FILGRASTIM 300 MCG/0.5ML IJ SOSY
300.0000 ug | PREFILLED_SYRINGE | Freq: Every day | INTRAMUSCULAR | Status: DC
  Administered 2018-09-03: 300 ug via SUBCUTANEOUS
  Filled 2018-09-03: qty 0.5

## 2018-09-03 NOTE — Telephone Encounter (Signed)
Called and spoke to [place] and let her know that the neupoen inj. To help with wbc was approved and she will come today and get inj. Then tom. After her ct scan she will have labs and the inj. Depending on lab results. She is agreeable. I did also tell [name] that pt. Will see Lauren on Thursday and if she needs fluids it can be worked on.

## 2018-09-04 ENCOUNTER — Inpatient Hospital Stay: Payer: Medicare HMO

## 2018-09-04 ENCOUNTER — Ambulatory Visit
Admission: RE | Admit: 2018-09-04 | Discharge: 2018-09-04 | Disposition: A | Discharge status: Home / Self Care | Payer: Medicare HMO | Source: Ambulatory Visit | Attending: Oncology | Admitting: Oncology

## 2018-09-04 ENCOUNTER — Inpatient Hospital Stay: Payer: Medicare HMO | Admitting: Nurse Practitioner

## 2018-09-04 DIAGNOSIS — I700 Atherosclerosis of aorta: Secondary | ICD-10-CM | POA: Insufficient documentation

## 2018-09-04 DIAGNOSIS — Z79899 Other long term (current) drug therapy: Secondary | ICD-10-CM | POA: Insufficient documentation

## 2018-09-04 DIAGNOSIS — I2510 Atherosclerotic heart disease of native coronary artery without angina pectoris: Secondary | ICD-10-CM | POA: Diagnosis not present

## 2018-09-04 DIAGNOSIS — C569 Malignant neoplasm of unspecified ovary: Secondary | ICD-10-CM | POA: Insufficient documentation

## 2018-09-04 DIAGNOSIS — R188 Other ascites: Secondary | ICD-10-CM | POA: Insufficient documentation

## 2018-09-04 DIAGNOSIS — R918 Other nonspecific abnormal finding of lung field: Secondary | ICD-10-CM | POA: Diagnosis not present

## 2018-09-04 MED ORDER — IOHEXOL 300 MG/ML  SOLN
100.0000 mL | Freq: Once | INTRAMUSCULAR | Status: AC | PRN
  Administered 2018-09-04: 100 mL via INTRAVENOUS

## 2018-09-05 ENCOUNTER — Encounter: Payer: Self-pay | Admitting: *Deleted

## 2018-09-08 ENCOUNTER — Inpatient Hospital Stay: Payer: Medicare HMO

## 2018-09-08 ENCOUNTER — Inpatient Hospital Stay (HOSPITAL_BASED_OUTPATIENT_CLINIC_OR_DEPARTMENT_OTHER): Payer: Medicare HMO | Admitting: Oncology

## 2018-09-08 ENCOUNTER — Other Ambulatory Visit: Payer: Self-pay

## 2018-09-08 VITALS — BP 129/69 | HR 105 | Temp 98.9°F | Resp 18 | Wt 105.5 lb

## 2018-09-08 DIAGNOSIS — Z90710 Acquired absence of both cervix and uterus: Secondary | ICD-10-CM | POA: Diagnosis not present

## 2018-09-08 DIAGNOSIS — D701 Agranulocytosis secondary to cancer chemotherapy: Secondary | ICD-10-CM

## 2018-09-08 DIAGNOSIS — R630 Anorexia: Secondary | ICD-10-CM | POA: Diagnosis not present

## 2018-09-08 DIAGNOSIS — Z515 Encounter for palliative care: Secondary | ICD-10-CM | POA: Diagnosis not present

## 2018-09-08 DIAGNOSIS — E039 Hypothyroidism, unspecified: Secondary | ICD-10-CM | POA: Diagnosis not present

## 2018-09-08 DIAGNOSIS — Z95828 Presence of other vascular implants and grafts: Secondary | ICD-10-CM

## 2018-09-08 DIAGNOSIS — I2510 Atherosclerotic heart disease of native coronary artery without angina pectoris: Secondary | ICD-10-CM | POA: Diagnosis not present

## 2018-09-08 DIAGNOSIS — N189 Chronic kidney disease, unspecified: Secondary | ICD-10-CM | POA: Diagnosis not present

## 2018-09-08 DIAGNOSIS — Z90722 Acquired absence of ovaries, bilateral: Secondary | ICD-10-CM | POA: Diagnosis not present

## 2018-09-08 DIAGNOSIS — D649 Anemia, unspecified: Secondary | ICD-10-CM | POA: Diagnosis not present

## 2018-09-08 DIAGNOSIS — C569 Malignant neoplasm of unspecified ovary: Secondary | ICD-10-CM

## 2018-09-08 DIAGNOSIS — D638 Anemia in other chronic diseases classified elsewhere: Secondary | ICD-10-CM

## 2018-09-08 DIAGNOSIS — R971 Elevated cancer antigen 125 [CA 125]: Secondary | ICD-10-CM | POA: Diagnosis not present

## 2018-09-08 DIAGNOSIS — Z5111 Encounter for antineoplastic chemotherapy: Secondary | ICD-10-CM

## 2018-09-08 LAB — CBC WITH DIFFERENTIAL/PLATELET
Abs Immature Granulocytes: 0.5 10*3/uL — ABNORMAL HIGH (ref 0.00–0.07)
Band Neutrophils: 30 %
Basophils Absolute: 0 10*3/uL (ref 0.0–0.1)
Basophils Relative: 0 %
Eosinophils Absolute: 0.1 10*3/uL (ref 0.0–0.5)
Eosinophils Relative: 2 %
HCT: 29.4 % — ABNORMAL LOW (ref 36.0–46.0)
Hemoglobin: 9.8 g/dL — ABNORMAL LOW (ref 12.0–15.0)
Lymphocytes Relative: 9 %
Lymphs Abs: 0.5 10*3/uL — ABNORMAL LOW (ref 0.7–4.0)
MCH: 31.1 pg (ref 26.0–34.0)
MCHC: 33.3 g/dL (ref 30.0–36.0)
MCV: 93.3 fL (ref 80.0–100.0)
Metamyelocytes Relative: 6 %
Monocytes Absolute: 0.1 10*3/uL (ref 0.1–1.0)
Monocytes Relative: 2 %
Myelocytes: 2 %
Neutro Abs: 4.7 10*3/uL (ref 1.7–7.7)
Neutrophils Relative %: 49 %
Platelets: 331 10*3/uL (ref 150–400)
RBC: 3.15 MIL/uL — ABNORMAL LOW (ref 3.87–5.11)
RDW: 15.9 % — ABNORMAL HIGH (ref 11.5–15.5)
Smear Review: NORMAL
WBC: 6 10*3/uL (ref 4.0–10.5)
nRBC: 0 % (ref 0.0–0.2)

## 2018-09-08 LAB — COMPREHENSIVE METABOLIC PANEL
ALT: 21 U/L (ref 0–44)
AST: 23 U/L (ref 15–41)
Albumin: 3.1 g/dL — ABNORMAL LOW (ref 3.5–5.0)
Alkaline Phosphatase: 70 U/L (ref 38–126)
Anion gap: 9 (ref 5–15)
BUN: 23 mg/dL (ref 8–23)
CO2: 29 mmol/L (ref 22–32)
Calcium: 9.3 mg/dL (ref 8.9–10.3)
Chloride: 101 mmol/L (ref 98–111)
Creatinine, Ser: 0.81 mg/dL (ref 0.44–1.00)
GFR calc Af Amer: >60 mL/min (ref >60)
GFR calc non Af Amer: >60 mL/min (ref >60)
Glucose, Bld: 105 mg/dL — ABNORMAL HIGH (ref 70–99)
Potassium: 4.2 mmol/L (ref 3.5–5.1)
Sodium: 139 mmol/L (ref 135–145)
Total Bilirubin: 0.3 mg/dL (ref 0.3–1.2)
Total Protein: 6.6 g/dL (ref 6.5–8.1)

## 2018-09-08 MED ORDER — SODIUM CHLORIDE 0.9 % IV SOLN
60.0000 mg/m2 | Freq: Once | INTRAVENOUS | Status: AC
  Administered 2018-09-08: 84 mg via INTRAVENOUS
  Filled 2018-09-08: qty 14

## 2018-09-08 MED ORDER — DIPHENHYDRAMINE HCL 50 MG/ML IJ SOLN
12.5000 mg | Freq: Once | INTRAMUSCULAR | Status: AC
  Administered 2018-09-08: 12:00:00 12.5 mg via INTRAVENOUS
  Filled 2018-09-08: qty 1

## 2018-09-08 MED ORDER — SODIUM CHLORIDE 0.9 % IV SOLN
85.0500 mg | Freq: Once | INTRAVENOUS | Status: AC
  Administered 2018-09-08: 90 mg via INTRAVENOUS
  Filled 2018-09-08: qty 9

## 2018-09-08 MED ORDER — SODIUM CHLORIDE 0.9% FLUSH
10.0000 mL | Freq: Once | INTRAVENOUS | Status: AC
  Administered 2018-09-08: 10 mL via INTRAVENOUS
  Filled 2018-09-08: qty 10

## 2018-09-08 MED ORDER — SODIUM CHLORIDE 0.9 % IV SOLN
Freq: Once | INTRAVENOUS | Status: AC
  Administered 2018-09-08: 13:00:00 via INTRAVENOUS
  Filled 2018-09-08: qty 5

## 2018-09-08 MED ORDER — FAMOTIDINE IN NACL 20-0.9 MG/50ML-% IV SOLN
20.0000 mg | Freq: Once | INTRAVENOUS | Status: AC
  Administered 2018-09-08: 20 mg via INTRAVENOUS
  Filled 2018-09-08: qty 50

## 2018-09-08 MED ORDER — HEPARIN SOD (PORK) LOCK FLUSH 100 UNIT/ML IV SOLN
INTRAVENOUS | Status: AC
  Filled 2018-09-08: qty 5

## 2018-09-08 MED ORDER — PALONOSETRON HCL INJECTION 0.25 MG/5ML
0.2500 mg | Freq: Once | INTRAVENOUS | Status: AC
  Administered 2018-09-08: 0.25 mg via INTRAVENOUS
  Filled 2018-09-08: qty 5

## 2018-09-08 MED ORDER — HEPARIN SOD (PORK) LOCK FLUSH 100 UNIT/ML IV SOLN
500.0000 [IU] | Freq: Once | INTRAVENOUS | Status: AC
  Administered 2018-09-08: 500 [IU] via INTRAVENOUS

## 2018-09-08 MED ORDER — SODIUM CHLORIDE 0.9 % IV SOLN
Freq: Once | INTRAVENOUS | Status: AC
  Administered 2018-09-08: 12:00:00 via INTRAVENOUS
  Filled 2018-09-08: qty 250

## 2018-09-08 NOTE — Progress Notes (Signed)
Reviewed paramaters/VS with Dr. Janese Banks, proceed with treatment today per Dr. Janese Banks.

## 2018-09-08 NOTE — Progress Notes (Signed)
Patient is here for follow up, she is doing well no complaints.  

## 2018-09-09 ENCOUNTER — Encounter: Payer: Self-pay | Admitting: Oncology

## 2018-09-09 DIAGNOSIS — N135 Crossing vessel and stricture of ureter without hydronephrosis: Secondary | ICD-10-CM | POA: Diagnosis not present

## 2018-09-09 NOTE — Progress Notes (Signed)
Hematology/Oncology Consult note [facility]  [phone] [phone]  Patient Care Team: Rusty Aus, MD as PCP - General (Internal Medicine) Clent Jacks, RN as Registered Nurse   Name of the patient: Natalie Stone  [id number]  Jul 28, 1926   Date of visit: 09/09/18  Diagnosis-recurrent ovarian cancer  Chief complaint/ Reason for visit-on treatment assessment prior to cycle 7 of weekly carbotaxol chemotherapy  Heme/Onc history: Patient is a 85 year old female with a past medical history significant for stage III ovarian cancer that was treated in [place] and 2013. She underwent neoadjuvant chemotherapy followed by surgery with Dr. Beverly Milch followed by adjuvant chemotherapy. Genetic testing was negative. She then established care at [facility] in 2017. More recently patient was found to have a consistently increasing Ca1 25 level from 42.6 in December 20 18-781 in February 2020. Most recent Ca1 25 was 02/21/2005 on 07/06/2018. Scans were however negative for any recurrence. Patient had CT abdomen and pelvis with contrast done on 07/06/2018 which showed severe left hydro ureteral nephrosis. Sigmoid wall thickening differential includes carcinomatosis and colitis. She did had a repeat scan on 07/09/2018 which showed interval increase in ascites mostly within the pelvis with associated enhancement of the peritoneum suggesting carcinomatosis. Redemonstration of sigmoid wall thickening which may represent colitis versus carcinomatosis. Delayed left nephrogram with severe left hydro-ureteronephrosis similar to prior.  Given findings of colitis as well as ongoing symptoms of melena and diarrhea patient underwent EGD as well as flexible sigmoidoscopy which showed Schatzki's ring and diverticulosis but no evidence of tumor. Her symptoms are possibly secondary to carcinomatosis. She also had other stool studies done including C. difficile and stool  cultures which were negative.Patient did undergo a diagnostic tap of her ascites on 07/09/2018 which was consistent with adenocarcinoma  Plan was to do weekly carbotaxol chemotherapy with carboplatin only for the first 3 weeks to assess patient tolerance. Cycle 1 was started on 07/28/2018.   Interval history-she has mild fatigue but overall is doing well today.  Appetite has remained stable and she is up by 5 pounds today.  ECOG PS- 1 Pain scale- 0   Review of systems- Review of Systems  Constitutional: Positive for malaise/fatigue. Negative for chills, fever and weight loss.  HENT: Negative for congestion, ear discharge and nosebleeds.   Eyes: Negative for blurred vision.  Respiratory: Negative for cough, hemoptysis, sputum production, shortness of breath and wheezing.   Cardiovascular: Negative for chest pain, palpitations, orthopnea and claudication.  Gastrointestinal: Negative for abdominal pain, blood in stool, constipation, diarrhea, heartburn, melena, nausea and vomiting.  Genitourinary: Negative for dysuria, flank pain, frequency, hematuria and urgency.  Musculoskeletal: Negative for back pain, joint pain and myalgias.  Skin: Negative for rash.  Neurological: Negative for dizziness, tingling, focal weakness, seizures, weakness and headaches.  Endo/Heme/Allergies: Does not bruise/bleed easily.  Psychiatric/Behavioral: Negative for depression and suicidal ideas. The patient does not have insomnia.       Allergies  Allergen Reactions   Penicillins Anaphylaxis    Has patient had a PCN reaction causing immediate rash, facial/tongue/throat swelling, SOB or lightheadedness with hypotension: Yes Has patient had a PCN reaction causing severe rash involving mucus membranes or skin necrosis: No Has patient had a PCN reaction that required hospitalization No Has patient had a PCN reaction occurring within the last 10 years: No If all of the above answers are "NO", then may proceed with  Cephalosporin use.   Ativan [Lorazepam] Other (See Comments)  Made her not remember for many hours.     Past Medical History:  Diagnosis Date   CAD (coronary artery disease)    Chronic kidney disease    urinary incontinence   Hyperthyroidism    Hypothyroidism (acquired)    Ovarian cancer ([place]) 2014   Shingles    Third degree AV block ([place]) 10/09/2015     Past Surgical History:  Procedure Laterality Date   ABDOMINAL HYSTERECTOMY     BLADDER REPAIR     CORONARY ANGIOPLASTY WITH STENT PLACEMENT  2001   Dr. Isaiah Blakes in [place]   CYSTOURETHROSCOPY, WITH INSERTION OF INDWELLING URETERAL STENT (EG,GIBBONS OR DOUBLE-J TYPE) (Left Ureter Left    PACEMAKER INSERTION N/A 10/11/2015   Procedure: INSERTION PACEMAKER;  Surgeon: [name], MD;  Location: [facility] ORS;  Service: Cardiovascular;  Laterality: N/A;   PARTIAL HYSTERECTOMY     PORTA CATH INSERTION N/A 07/24/2018   Procedure: PORTA CATH INSERTION;  Surgeon: Algernon Huxley, MD;  Location: [facility];  Service: Cardiovascular;  Laterality: N/A;    Social History   Socioeconomic History   Marital status: Widowed    Spouse name: Not on file   Number of children: Not on file   Years of education: Not on file   Highest education level: Not on file  Occupational History   Not on file  Social Needs   Financial resource strain: Not on file   Food insecurity    Worry: Not on file    Inability: Not on file   Transportation needs    Medical: Not on file    Non-medical: Not on file  Tobacco Use   Smoking status: Never Smoker   Smokeless tobacco: Never Used  Substance and Sexual Activity   Alcohol use: No   Drug use: No   Sexual activity: Never  Lifestyle   Physical activity    Days per week: Not on file    Minutes per session: Not on file   Stress: Not on file  Relationships   Social connections    Talks on phone: Not on file    Gets together: Not on file    Attends religious  service: Not on file    Active member of club or organization: Not on file    Attends meetings of clubs or organizations: Not on file    Relationship status: Not on file   Intimate partner violence    Fear of current or ex partner: Not on file    Emotionally abused: Not on file    Physically abused: Not on file    Forced sexual activity: Not on file  Other Topics Concern   Not on file  Social History Narrative   moved from [place] in July 2017; 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 , Disp: , Rfl:    b complex vitamins tablet, Take 1 tablet by mouth daily., Disp: , Rfl:    Bioflavonoid Products (ESTER C PO), Take 500 mg by mouth daily. , Disp: , Rfl:    Calcium Carbonate-Vitamin D (CALTRATE 600+D PO), Take 1 tablet by mouth daily., Disp: , Rfl:    Cholecalciferol (VITAMIN D3) 1000 units CAPS, Take  1,000 Units by mouth daily. , Disp: , Rfl:    Coenzyme Q10 (COQ10) 100 MG CAPS, Take 100 mg by mouth daily., Disp: , Rfl:    Glucosamine-MSM-Hyaluronic Acd (JOINT HEALTH PO), Take 1 tablet by mouth 2 (two) times a day. , Disp: , Rfl:    levothyroxine (SYNTHROID) 100 MCG tablet, Take 100 mcg by mouth daily before breakfast. , Disp: , Rfl:    mirtazapine (REMERON) 7.5 MG tablet, Take 7.5 mg by mouth at bedtime., Disp: , Rfl:    OLANZapine (ZYPREXA) 5 MG tablet, Take 1 tablet (5 mg total) by mouth at bedtime., Disp: 30 tablet, Rfl: 0   omeprazole (PRILOSEC) 40 MG capsule, Take 40 mg by mouth daily., Disp: , Rfl:    ondansetron (ZOFRAN) 4 MG tablet, Take 1 tablet (4 mg total) by mouth every 8 (eight) hours as needed for nausea or vomiting., Disp: 90 tablet, Rfl: 0  Physical exam:  Vitals:   09/08/18 1103  BP: 129/69  Pulse: (!)  105  Resp: 18  Temp: 98.9 F (37.2 C)  Weight: 105 lb 8 oz (47.9 kg)   Physical Exam Constitutional:      Comments: Thin frail elderly woman in no acute distress  HENT:     Head: Normocephalic and atraumatic.  Eyes:     Pupils: Pupils are equal, round, and reactive to light.  Neck:     Musculoskeletal: Normal range of motion.  Cardiovascular:     Rate and Rhythm: Normal rate and regular rhythm.     Heart sounds: Normal heart sounds.  Pulmonary:     Effort: Pulmonary effort is normal.     Breath sounds: Normal breath sounds.  Abdominal:     General: Bowel sounds are normal.     Palpations: Abdomen is soft.  Skin:    General: Skin is warm and dry.  Neurological:     Mental Status: She is alert and oriented to person, place, and time.      CMP Latest Ref Rng & Units 09/08/2018  Glucose 70 - 99 mg/dL 105(H)  BUN 8 - 23 mg/dL 23  Creatinine 0.44 - 1.00 mg/dL 0.81  Sodium 135 - 145 mmol/L 139  Potassium 3.5 - 5.1 mmol/L 4.2  Chloride 98 - 111 mmol/L 101  CO2 22 - 32 mmol/L 29  Calcium 8.9 - 10.3 mg/dL 9.3  Total Protein 6.5 - 8.1 g/dL 6.6  Total Bilirubin 0.3 - 1.2 mg/dL 0.3  Alkaline Phos 38 - 126 U/L 70  AST 15 - 41 U/L 23  ALT 0 - 44 U/L 21   CBC Latest Ref Rng & Units 09/08/2018  WBC 4.0 - 10.5 K/uL 6.0  Hemoglobin 12.0 - 15.0 g/dL 9.8(L)  Hematocrit 36.0 - 46.0 % 29.4(L)  Platelets 150 - 400 K/uL 331    No images are attached to the encounter.  Ct Chest W Contrast  Result Date: 09/04/2018 CLINICAL DATA:  Ovarian cancer.  Restaging EXAM: CT CHEST, ABDOMEN, AND PELVIS WITH CONTRAST TECHNIQUE: Multidetector CT imaging of the chest, abdomen and pelvis was performed following the standard protocol during bolus administration of intravenous contrast. CONTRAST:  131mL OMNIPAQUE IOHEXOL 300 MG/ML  SOLN COMPARISON:  CT chest 10/30/2017 and CT AP 12/23/2017 FINDINGS: CT CHEST FINDINGS Cardiovascular: Normal heart size. Aortic atherosclerosis. Coronary artery  calcifications. No pericardial effusion. Mediastinum/Nodes: Normal appearance of the thyroid gland. The trachea appears patent and is midline. Normal appearance of the esophagus. No axillary or supraclavicular adenopathy. No mediastinal or hilar adenopathy.  Lungs/Pleura: No pleural effusion. New cluster of tree-in-bud nodules identified within the anterolateral right lower lobe, likely postinflammatory/infectious, image 80/4. 2 mm right lower lobe lung nodule is unchanged, image 130/3. Right middle lobe perifissural nodule is unchanged measuring 3 mm, image 134/4. No suspicious nodule or mass. Musculoskeletal: No chest wall mass or suspicious bone lesions identified. CT ABDOMEN PELVIS FINDINGS Hepatobiliary: No suspicious liver abnormality. The gallbladder appears within normal limits. Upper limits of normal common bile duct measuring 6 mm. Pancreas: Unremarkable. No pancreatic ductal dilatation or surrounding inflammatory changes. Spleen: Normal in size without focal abnormality. Adrenals/Urinary Tract: Normal appearance of the adrenal glands. Interval placement of left-sided nephroureteral stent. No hydronephrosis or mass identified bilaterally. Urinary bladder appears normal. Stomach/Bowel: Stomach is within normal limits. No dilated loops of small bowel. There is a large stool burden identified throughout the colon Vascular/Lymphatic: Aortic atherosclerosis. No aneurysm. No retroperitoneal, mesenteric or pelvic adenopathy. Reproductive: Status post hysterectomy. Not confidently identified on the previous exam is a solid and cystic structure in the right adnexal region measuring 3.1 by 2.4 cm, image 97/2. No left adnexal mass. Other: Interval development of loculated ascites within the abdomen and pelvis. Suspicious for recurrent peritoneal disease. Enhancing peritoneal nodule within the left lower quadrant of the abdomen is new measuring 7 mm. Musculoskeletal: No suspicious aggressive lytic or sclerotic bone  lesions. IMPRESSION: 1. Interval development of loculated ascites within the abdomen and pelvis concerning for recurrent peritoneal disease. New solid and cystic lesion in the right adnexal region is noted, concerning for tumor deposit. Additionally, there is a small nodule along the left pericolic gutter concerning for peritoneal disease. 2. No findings to suggest metastatic disease to the chest. Electronically Signed   By: Kerby Moors M.D.   On: 09/04/2018 15:27   Ct Abdomen Pelvis W Contrast  Result Date: 09/04/2018 CLINICAL DATA:  Ovarian cancer.  Restaging EXAM: CT CHEST, ABDOMEN, AND PELVIS WITH CONTRAST TECHNIQUE: Multidetector CT imaging of the chest, abdomen and pelvis was performed following the standard protocol during bolus administration of intravenous contrast. CONTRAST:  166mL OMNIPAQUE IOHEXOL 300 MG/ML  SOLN COMPARISON:  CT chest 10/30/2017 and CT AP 12/23/2017 FINDINGS: CT CHEST FINDINGS Cardiovascular: Normal heart size. Aortic atherosclerosis. Coronary artery calcifications. No pericardial effusion. Mediastinum/Nodes: Normal appearance of the thyroid gland. The trachea appears patent and is midline. Normal appearance of the esophagus. No axillary or supraclavicular adenopathy. No mediastinal or hilar adenopathy. Lungs/Pleura: No pleural effusion. New cluster of tree-in-bud nodules identified within the anterolateral right lower lobe, likely postinflammatory/infectious, image 80/4. 2 mm right lower lobe lung nodule is unchanged, image 130/3. Right middle lobe perifissural nodule is unchanged measuring 3 mm, image 134/4. No suspicious nodule or mass. Musculoskeletal: No chest wall mass or suspicious bone lesions identified. CT ABDOMEN PELVIS FINDINGS Hepatobiliary: No suspicious liver abnormality. The gallbladder appears within normal limits. Upper limits of normal common bile duct measuring 6 mm. Pancreas: Unremarkable. No pancreatic ductal dilatation or surrounding inflammatory changes.  Spleen: Normal in size without focal abnormality. Adrenals/Urinary Tract: Normal appearance of the adrenal glands. Interval placement of left-sided nephroureteral stent. No hydronephrosis or mass identified bilaterally. Urinary bladder appears normal. Stomach/Bowel: Stomach is within normal limits. No dilated loops of small bowel. There is a large stool burden identified throughout the colon Vascular/Lymphatic: Aortic atherosclerosis. No aneurysm. No retroperitoneal, mesenteric or pelvic adenopathy. Reproductive: Status post hysterectomy. Not confidently identified on the previous exam is a solid and cystic structure in the right adnexal region measuring 3.1 by 2.4 cm,  image 97/2. No left adnexal mass. Other: Interval development of loculated ascites within the abdomen and pelvis. Suspicious for recurrent peritoneal disease. Enhancing peritoneal nodule within the left lower quadrant of the abdomen is new measuring 7 mm. Musculoskeletal: No suspicious aggressive lytic or sclerotic bone lesions. IMPRESSION: 1. Interval development of loculated ascites within the abdomen and pelvis concerning for recurrent peritoneal disease. New solid and cystic lesion in the right adnexal region is noted, concerning for tumor deposit. Additionally, there is a small nodule along the left pericolic gutter concerning for peritoneal disease. 2. No findings to suggest metastatic disease to the chest. Electronically Signed   By: Kerby Moors M.D.   On: 09/04/2018 15:27     Assessment and plan- Patient is a 85 y.o. female with recurrent ovarian carcinoma.  She is here for on treatment assessment prior to cycle  7 of weekly carbotaxol chemotherapy  I have reviewed CT chest abdomen and pelvis images independently and discussed findings with the patient as well as her daughter.This CT scan was compared to the one that was done in September and November 2019 and therefore shows evidence of ascites as well as right adnexal lesion  concerning for recurrence.  However she did have a scan at the [facility] in May 2020 which is not compared with.  Overall I do feel that she is responding well to treatment especially given that her Ca1 25 has come down to 271 from a prior value of 1326 on 08/04/2018.   Patient was mildly neutropenic last week and needed 2 doses of Neupogen.  Her white count has normalized today and counts are otherwise okay to proceed with cycle 7 of weekly carbotaxol chemotherapy today.  She ideally needs 18 weekly cycles and given her age frailty as well as increased risk of neutropenia we may have to take frequent treatment breaks.  She will directly proceed for cycle 8 of weekly carbotaxol chemotherapy next week and I will give her a 1 week break following that.   I will see her back in 3 weeks with CBC with differential, CMP and Ca1 25 for cycle 9 of weekly carbotaxol chemotherapy.   Visit Diagnosis 1. Malignant neoplasm of ovary, unspecified laterality ([place])   2. Encounter for antineoplastic chemotherapy   3. Anemia of chronic disease      Dr. Randa Evens, MD, MPH [facility] [phone] 09/09/2018 8:46 AM

## 2018-09-15 ENCOUNTER — Inpatient Hospital Stay: Payer: Medicare HMO

## 2018-09-15 ENCOUNTER — Other Ambulatory Visit: Payer: Self-pay

## 2018-09-15 VITALS — BP 126/84 | HR 100 | Resp 20

## 2018-09-15 DIAGNOSIS — Z90722 Acquired absence of ovaries, bilateral: Secondary | ICD-10-CM | POA: Diagnosis not present

## 2018-09-15 DIAGNOSIS — I2510 Atherosclerotic heart disease of native coronary artery without angina pectoris: Secondary | ICD-10-CM | POA: Diagnosis not present

## 2018-09-15 DIAGNOSIS — C569 Malignant neoplasm of unspecified ovary: Secondary | ICD-10-CM | POA: Diagnosis not present

## 2018-09-15 DIAGNOSIS — R971 Elevated cancer antigen 125 [CA 125]: Secondary | ICD-10-CM | POA: Diagnosis not present

## 2018-09-15 DIAGNOSIS — E039 Hypothyroidism, unspecified: Secondary | ICD-10-CM | POA: Diagnosis not present

## 2018-09-15 DIAGNOSIS — N189 Chronic kidney disease, unspecified: Secondary | ICD-10-CM | POA: Diagnosis not present

## 2018-09-15 DIAGNOSIS — Z95828 Presence of other vascular implants and grafts: Secondary | ICD-10-CM

## 2018-09-15 DIAGNOSIS — Z515 Encounter for palliative care: Secondary | ICD-10-CM | POA: Diagnosis not present

## 2018-09-15 DIAGNOSIS — R630 Anorexia: Secondary | ICD-10-CM | POA: Diagnosis not present

## 2018-09-15 DIAGNOSIS — Z90710 Acquired absence of both cervix and uterus: Secondary | ICD-10-CM | POA: Diagnosis not present

## 2018-09-15 LAB — CBC WITH DIFFERENTIAL/PLATELET
Abs Immature Granulocytes: 0.1 10*3/uL — ABNORMAL HIGH (ref 0.00–0.07)
Basophils Absolute: 0 10*3/uL (ref 0.0–0.1)
Basophils Relative: 1 %
Eosinophils Absolute: 0.1 10*3/uL (ref 0.0–0.5)
Eosinophils Relative: 1 %
HCT: 31 % — ABNORMAL LOW (ref 36.0–46.0)
Hemoglobin: 10.2 g/dL — ABNORMAL LOW (ref 12.0–15.0)
Immature Granulocytes: 1 %
Lymphocytes Relative: 24 %
Lymphs Abs: 1.7 10*3/uL (ref 0.7–4.0)
MCH: 30.8 pg (ref 26.0–34.0)
MCHC: 32.9 g/dL (ref 30.0–36.0)
MCV: 93.7 fL (ref 80.0–100.0)
Monocytes Absolute: 0.3 10*3/uL (ref 0.1–1.0)
Monocytes Relative: 4 %
Neutro Abs: 4.7 10*3/uL (ref 1.7–7.7)
Neutrophils Relative %: 69 %
Platelets: 350 10*3/uL (ref 150–400)
RBC: 3.31 MIL/uL — ABNORMAL LOW (ref 3.87–5.11)
RDW: 16 % — ABNORMAL HIGH (ref 11.5–15.5)
WBC: 6.9 10*3/uL (ref 4.0–10.5)
nRBC: 0 % (ref 0.0–0.2)

## 2018-09-15 LAB — COMPREHENSIVE METABOLIC PANEL
ALT: 24 U/L (ref 0–44)
AST: 28 U/L (ref 15–41)
Albumin: 3.6 g/dL (ref 3.5–5.0)
Alkaline Phosphatase: 71 U/L (ref 38–126)
Anion gap: 8 (ref 5–15)
BUN: 26 mg/dL — ABNORMAL HIGH (ref 8–23)
CO2: 28 mmol/L (ref 22–32)
Calcium: 9.5 mg/dL (ref 8.9–10.3)
Chloride: 103 mmol/L (ref 98–111)
Creatinine, Ser: 0.79 mg/dL (ref 0.44–1.00)
GFR calc Af Amer: >60 mL/min (ref >60)
GFR calc non Af Amer: >60 mL/min (ref >60)
Glucose, Bld: 137 mg/dL — ABNORMAL HIGH (ref 70–99)
Potassium: 4 mmol/L (ref 3.5–5.1)
Sodium: 139 mmol/L (ref 135–145)
Total Bilirubin: 0.5 mg/dL (ref 0.3–1.2)
Total Protein: 7.3 g/dL (ref 6.5–8.1)

## 2018-09-15 MED ORDER — HEPARIN SOD (PORK) LOCK FLUSH 100 UNIT/ML IV SOLN
500.0000 [IU] | Freq: Once | INTRAVENOUS | Status: AC | PRN
  Administered 2018-09-15: 500 [IU]
  Filled 2018-09-15: qty 5

## 2018-09-15 MED ORDER — PALONOSETRON HCL INJECTION 0.25 MG/5ML
0.2500 mg | Freq: Once | INTRAVENOUS | Status: AC
  Administered 2018-09-15: 0.25 mg via INTRAVENOUS
  Filled 2018-09-15: qty 5

## 2018-09-15 MED ORDER — SODIUM CHLORIDE 0.9% FLUSH
10.0000 mL | Freq: Once | INTRAVENOUS | Status: AC
  Administered 2018-09-15: 10 mL via INTRAVENOUS
  Filled 2018-09-15: qty 10

## 2018-09-15 MED ORDER — SODIUM CHLORIDE 0.9 % IV SOLN
Freq: Once | INTRAVENOUS | Status: AC
  Administered 2018-09-15: 11:00:00 via INTRAVENOUS
  Filled 2018-09-15: qty 250

## 2018-09-15 MED ORDER — SODIUM CHLORIDE 0.9 % IV SOLN
85.0500 mg | Freq: Once | INTRAVENOUS | Status: AC
  Administered 2018-09-15: 90 mg via INTRAVENOUS
  Filled 2018-09-15: qty 9

## 2018-09-15 MED ORDER — FAMOTIDINE IN NACL 20-0.9 MG/50ML-% IV SOLN
20.0000 mg | Freq: Once | INTRAVENOUS | Status: AC
  Administered 2018-09-15: 20 mg via INTRAVENOUS
  Filled 2018-09-15: qty 50

## 2018-09-15 MED ORDER — DIPHENHYDRAMINE HCL 50 MG/ML IJ SOLN
12.5000 mg | Freq: Once | INTRAMUSCULAR | Status: AC
  Administered 2018-09-15: 12.5 mg via INTRAVENOUS
  Filled 2018-09-15: qty 1

## 2018-09-15 MED ORDER — SODIUM CHLORIDE 0.9 % IV SOLN
Freq: Once | INTRAVENOUS | Status: AC
  Administered 2018-09-15: 12:00:00 via INTRAVENOUS
  Filled 2018-09-15: qty 5

## 2018-09-15 MED ORDER — SODIUM CHLORIDE 0.9 % IV SOLN
60.0000 mg/m2 | Freq: Once | INTRAVENOUS | Status: AC
  Administered 2018-09-15: 84 mg via INTRAVENOUS
  Filled 2018-09-15: qty 14

## 2018-09-16 DIAGNOSIS — C561 Malignant neoplasm of right ovary: Secondary | ICD-10-CM | POA: Diagnosis not present

## 2018-09-16 DIAGNOSIS — N1330 Unspecified hydronephrosis: Secondary | ICD-10-CM | POA: Diagnosis not present

## 2018-09-16 DIAGNOSIS — Z7982 Long term (current) use of aspirin: Secondary | ICD-10-CM | POA: Diagnosis not present

## 2018-09-16 DIAGNOSIS — Z90710 Acquired absence of both cervix and uterus: Secondary | ICD-10-CM | POA: Diagnosis not present

## 2018-09-16 DIAGNOSIS — Z950 Presence of cardiac pacemaker: Secondary | ICD-10-CM | POA: Diagnosis not present

## 2018-09-16 DIAGNOSIS — Z79899 Other long term (current) drug therapy: Secondary | ICD-10-CM | POA: Diagnosis not present

## 2018-09-16 DIAGNOSIS — R530 Neoplastic (malignant) related fatigue: Secondary | ICD-10-CM | POA: Diagnosis not present

## 2018-09-16 LAB — CA 125: Cancer Antigen (CA) 125: 86 U/mL — ABNORMAL HIGH (ref 0.0–38.1)

## 2018-09-17 ENCOUNTER — Inpatient Hospital Stay (HOSPITAL_BASED_OUTPATIENT_CLINIC_OR_DEPARTMENT_OTHER): Payer: Medicare HMO | Admitting: Obstetrics and Gynecology

## 2018-09-17 ENCOUNTER — Other Ambulatory Visit: Payer: Self-pay

## 2018-09-17 VITALS — BP 129/69 | HR 105 | Temp 99.0°F | Wt 106.2 lb

## 2018-09-17 DIAGNOSIS — R630 Anorexia: Secondary | ICD-10-CM | POA: Diagnosis not present

## 2018-09-17 DIAGNOSIS — N189 Chronic kidney disease, unspecified: Secondary | ICD-10-CM | POA: Diagnosis not present

## 2018-09-17 DIAGNOSIS — I2510 Atherosclerotic heart disease of native coronary artery without angina pectoris: Secondary | ICD-10-CM | POA: Diagnosis not present

## 2018-09-17 DIAGNOSIS — Z90722 Acquired absence of ovaries, bilateral: Secondary | ICD-10-CM

## 2018-09-17 DIAGNOSIS — C569 Malignant neoplasm of unspecified ovary: Secondary | ICD-10-CM | POA: Diagnosis not present

## 2018-09-17 DIAGNOSIS — Z90710 Acquired absence of both cervix and uterus: Secondary | ICD-10-CM

## 2018-09-17 DIAGNOSIS — Z515 Encounter for palliative care: Secondary | ICD-10-CM | POA: Diagnosis not present

## 2018-09-17 DIAGNOSIS — R971 Elevated cancer antigen 125 [CA 125]: Secondary | ICD-10-CM | POA: Diagnosis not present

## 2018-09-17 DIAGNOSIS — E039 Hypothyroidism, unspecified: Secondary | ICD-10-CM | POA: Diagnosis not present

## 2018-09-17 NOTE — Progress Notes (Signed)
Gynecologic Oncology Interval Visit   Referring Provider: Dr. Rogue Bussing  Chief Concern: recurrent ovarian cancer   Subjective:  Natalie Stone is a 85 y.o. female, initially seen in consultation from Dr. Rogue Bussing for ovarian cancer, who presents to the clinic today for re-evaluation since starting chemotherapy.   She initiated weekly carbo AUC 2 on 07/28/2018. For week 4, taxol 60 mg/m2 was added. She  completed D8 C3 on 09/15/2018. Weight has improved since starting Zyprexa. Some fatigue.   CA 125 has been followed in interm:  07/28/2018 1,066 08/04/2018 1,326.0 08/11/2018 1066 08/25/2018 623 09/19/2018 271 09/15/2018 86  She is scheduled for a cystourethroscopy with ureteral cathization and retrograde pyelogram for stent replacement on 09/25/2018 at [place] with Dr. Kandee Keen to assess continued need for stent.   Oncology History Ovarian cancer- stage III per patient [no records available]. Status post neoadjuvant chemotherapy followed by surgery with Dr Eppie Gibson in [place] in 2013; followed by adjuvant chemotherapy. Clinically no concerns for recurrence at this time.  She said she had negative genetic testing.  Establish care at [facility] in 2017 since she moved back to this area to be near family.  She is grandmother of Dr. Benjaman Kindler.  CA125 3/18 =13.7  02/05/2017- CT C/A/P IMPRESSION: 1. 14 x 13 mm low-density oval mass in the right retrocrural location at the level of the aortic hiatus. This may reflect a mildly enlarged retrocrural lymph node. This could potentially reflect metastatic adenopathy. It could reflect a small lymphangiocele or chronic reactive lymph node. If there are any previous CT exams available for comparison, these could help exclude metastatic disease. If not, follow-up chest CT in 2-3 months to reassess for stability would be recommended. 2. No other findings to suggest metastatic disease. No evidence of locally recurrent ovarian carcinoma. Status post hysterectomy. 3.  No acute findings. 4. Coronary artery calcifications.  Aortic atherosclerosis. 5. Mild increased colonic stool burden.  CA125 1/19 = 56.5  05/01/2017- CT C/A/P IMPRESSION: -Status post hysterectomy and suspected bilateral salpingo-oophorectomy. - No findings specific for recurrent or metastatic disease.  - Benign cystic lesion in the right retrocrural space is likely related to the thoracic duct. This is not considered suspicious for a lymph node. - 4 mm subpleural node in the posterior right middle lobe, new, nonspecific. This appearance is favored to be infectious/inflammatory. However, consider follow-up CT chest in 6 months.  CA125 5/19 = 174.9   06/27/17 CT C/A/P IMPRESSION: 1. There is a small amount of complex fluid within the pelvis with peripheral peritoneal enhancement. Findings are indeterminate but concerning for the possibility of recurrent disease. 2. Additionally within the pelvis there is a small hyperdense nodule which likely represents a hyperdense diverticulum as opposed to a peritoneal nodule as no definite additional peritoneal nodules are identified.  Recommend attention on follow-up.  3. Slight interval decrease in size of right middle lobe nodule, likely resolving infectious/inflammatory process. Recommend attention on follow-up.  Reviewed the May 2019 CT scan with radiology and there is nothing that is amenable to biopsy.   CA-125 6/19-  210.2  Mild SUI.  She had bladder repair many years ago for SUI after having children.  Has been treated with anticholenergics in past, but mouth was very dry and had problems talking so stopped the medication.  Now uses Mirabegron 50 mg qd (a B-agonist) for urge incontinence per Dr Buel Ream suggestion.  No symptoms since she started this medication.   Since 10/18 CA125 has risen progressively from 33 to 865.  She has had multiple negative scans of the chest/abd/pelvis (last 11/19) and brain.  She continues to be asymptomatic.    Last screening colonoscopy 2013.    Component     Latest Ref Rng & Units 12/19/2016 01/22/2017 03/21/2017 06/19/2017  Cancer Antigen (CA) 125     0.0 - 38.1 U/mL 33.3 42.6 (H) 56.5 (H) 174.9 (H)   Component     Latest Ref Rng & Units 08/05/2017 09/30/2017 11/27/2017 02/03/2018  Cancer Antigen (CA) 125     0.0 - 38.1 U/mL 210.2 (H) 297.0 (H) 334.0 (H) 865.0 (H)    In 5/19 the In vitae 83 gene panel was negative for pathogenic mutations. Had one VUS in the MET gene.   CT chest 10/30/17 1. No findings suspicious for metastatic disease in the chest. Solitary tiny 2 mm right middle lobe pulmonary nodule is again slightly decreased and probably benign. 2. Coronary atherosclerosis.  CT abd/pelvis 11/19 1. No acute abdominal/pelvic findings. 2. No abdominal/pelvic lymphadenopathy and no worrisome omental or peritoneal lesions are identified. No findings suspicious for recurrent tumor. 3. Advanced atherosclerotic disease involving the thoracic and abdominal aorta and branch vessels.  Seen by Dr. Fransisca Connors on 02/05/2018.  NED and asymptomatic at that time.  Normal CT scan and normal MRI of brain.  She presented to [facility] ER with complaints of abdominal pain, fatigue, nausea, poor appetite, and weight loss. She was found to have melena and admitted for concerns of recurrence of ovarian cancer. CA 125 was 1307 and CT demonstrated carcinomatosis and soft tissue pelvic mass, and hydronephrosis of left kidney. Biopsy was discussed but felt not to be feasible. Paracentesis was performed on 5/20 and cytology consistent with adenocarcinoma. She underwent left ureteral stent placement on 5/20. Given melena and diarrhea she also underwent flex sig and EGD which noted mild colonic edema, diverticulosis, and Schatski ring but no evidence of tumor burden or acute pathology. C diff, fecal leukocytes, stool O&P, stool culture, fecal calprotectin, and h pylori were negative. Ultimately melena and diarrhea felt to be  secondary to carcinomatosis. After extensive discussion of goals of care with patient and family, she elected to reinitiate chemotherapy. She has suffered poor oral intake, nausea, and diarrhea.   She continues to have diarrhea, fatigue, poor oral intake.   CA 125 has been followed:  1307.6  07/06/2018 781  04/04/2018 865  02/03/2018   Problem List: Patient Active Problem List   Diagnosis Date Noted  . Chemotherapy induced neutropenia ([place]) 09/01/2018  . Goals of care, counseling/discussion 07/21/2018  . CINV (chemotherapy-induced nausea and vomiting) 07/08/2018  . Diarrhea 07/06/2018  . Hydronephrosis of left kidney 07/06/2018  . Combined hyperlipidemia 02/07/2017  . Adult idiopathic generalized osteoporosis 11/05/2016  . Medicare annual wellness visit, initial 11/05/2016  . Malignant neoplasm of ovary ([place]) 11/23/2015  . Acquired hypothyroidism 10/26/2015  . Aortic atherosclerosis ([place]) 10/26/2015  . Cardiac pacemaker 10/26/2015  . Chemotherapy-induced neuropathy ([place]) 10/26/2015  . Coronary artery disease involving native coronary artery of native heart without angina pectoris 10/26/2015  . Pacemaker pocket hematoma 10/26/2015  . Status post placement of cardiac pacemaker 10/26/2015  . Bradycardia 10/09/2015  . Third degree AV block ([place]) 10/09/2015    Past Medical History: Past Medical History:  Diagnosis Date  . CAD (coronary artery disease)   . Chronic kidney disease    urinary incontinence  . Hyperthyroidism   . Hypothyroidism (acquired)   . Ovarian cancer ([place]) 2014  . Shingles   . Third degree AV block (  [place]) 10/09/2015    Past Surgical History: Past Surgical History:  Procedure Laterality Date  . ABDOMINAL HYSTERECTOMY    . BLADDER REPAIR    . CORONARY ANGIOPLASTY WITH STENT PLACEMENT  2001   Dr. Isaiah Blakes in [place]  . CYSTOURETHROSCOPY, WITH INSERTION OF INDWELLING URETERAL STENT (EG,GIBBONS OR DOUBLE-J TYPE) (Left Ureter Left   . PACEMAKER INSERTION N/A  10/11/2015   Procedure: INSERTION PACEMAKER;  Surgeon: [name], MD;  Location: [facility] ORS;  Service: Cardiovascular;  Laterality: N/A;  . PARTIAL HYSTERECTOMY    . PORTA CATH INSERTION N/A 07/24/2018   Procedure: PORTA CATH INSERTION;  Surgeon: Algernon Huxley, MD;  Location: [place] [facility];  Service: Cardiovascular;  Laterality: N/A;     Family History: Family History  Problem Relation Age of Onset  . Diabetes Brother     Social History: Social History   Socioeconomic History  . Marital status: Widowed    Spouse name: Not on file  . Number of children: Not on file  . Years of education: Not on file  . Highest education level: Not on file  Occupational History  . Not on file  Social Needs  . Financial resource strain: Not on file  . Food insecurity    Worry: Not on file    Inability: Not on file  . Transportation needs    Medical: Not on file    Non-medical: Not on file  Tobacco Use  . Smoking status: Never Smoker  . Smokeless tobacco: Never Used  Substance and Sexual Activity  . Alcohol use: No  . Drug use: No  . Sexual activity: Never  Lifestyle  . Physical activity    Days per week: Not on file    Minutes per session: Not on file  . Stress: Not on file  Relationships  . Social Herbalist on phone: Not on file    Gets together: Not on file    Attends religious service: Not on file    Active member of club or organization: Not on file    Attends meetings of clubs or organizations: Not on file    Relationship status: Not on file  . Intimate partner violence    Fear of current or ex partner: Not on file    Emotionally abused: Not on file    Physically abused: Not on file    Forced sexual activity: Not on file  Other Topics Concern  . 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 Penicillins Anaphylaxis    Has patient had  a PCN reaction causing immediate rash, facial/tongue/throat swelling, SOB or lightheadedness with hypotension: Yes Has patient had a PCN reaction causing severe rash involving mucus membranes or skin necrosis: No Has patient had a PCN reaction that required hospitalization No Has patient had a PCN reaction occurring within the last 10 years: No If all of the above answers are "NO", then may proceed with Cephalosporin use.  . Ativan [Lorazepam] Other (See Comments)    Made her not remember for many hours. - Pt reports that she is now able to take this medication without difficulty.    Current Medications: Current Outpatient Medications  Medication Sig Dispense Refill  . aspirin EC 81 MG tablet Take 81 mg by mouth daily.    Marland Kitchen atorvastatin (LIPITOR) 10 MG tablet Take 10 mg by mouth  at bedtime.     Marland Kitchen b complex vitamins tablet Take 1 tablet by mouth daily.    Marland Kitchen Bioflavonoid Products (ESTER C PO) Take 500 mg by mouth daily.     . Calcium Carbonate-Vitamin D (CALTRATE 600+D PO) Take 1 tablet by mouth daily.    . Cholecalciferol (VITAMIN D3) 1000 units CAPS Take 1,000 Units by mouth daily.     . Coenzyme Q10 (COQ10) 100 MG CAPS Take 100 mg by mouth daily.    . Glucosamine-MSM-Hyaluronic Acd (JOINT HEALTH PO) Take 1 tablet by mouth 2 (two) times a day.     . levothyroxine (SYNTHROID) 100 MCG tablet Take 100 mcg by mouth daily before breakfast.     . mirtazapine (REMERON) 7.5 MG tablet Take 7.5 mg by mouth at bedtime.    Marland Kitchen OLANZapine (ZYPREXA) 5 MG tablet Take 1 tablet (5 mg total) by mouth at bedtime. (Patient not taking: Reported on 09/17/2018) 30 tablet 0  . omeprazole (PRILOSEC) 40 MG capsule Take 40 mg by mouth daily.    . ondansetron (ZOFRAN) 4 MG tablet Take 1 tablet (4 mg total) by mouth every 8 (eight) hours as needed for nausea or vomiting. (Patient not taking: Reported on 09/17/2018) 90 tablet 0   No current facility-administered medications for this visit.    Review of Systems General:   no complaints Skin: no complaints Eyes: no complaints HEENT: no complaints Breasts: no complaints Pulmonary: no complaints Cardiac: no complaints Gastrointestinal: no complaints Genitourinary/Sexual: no complaints Ob/Gyn: no complaints Musculoskeletal: no complaints Hematology: no complaints Neurologic/Psych: no complaints   Objective:  Physical Examination:  BP 129/69 (BP Location: Left Arm, Patient Position: Sitting)   Pulse (!) 105   Temp 99 F (37.2 C) (Tympanic)   Wt 106 lb 3.2 oz (48.2 kg)   BMI 20.07 kg/m    ECOG Performance Status: 2 - Symptomatic, <50% confined to bed  GENERAL: Elderly, thin built, female. No acute distress.  HEENT:  Sclera clear. Anicteric NODES:  Negative axillary, supraclavicular, inguinal lymph node survery LUNGS:  Clear to auscultation bilaterally HEART:  Regular rate and rhythm.  ABDOMEN:  Soft, nontender.  No hernias, incisions well healed. No masses or ascites EXTREMITIES:  No peripheral edema. Atraumatic. No cyanosis SKIN:  Clear with no obvious rashes or skin changes.  NEURO:  Nonfocal. Well oriented.  Appropriate affect.  Pelvic: deferred    Assessment:  Natalie Stone is a 85 y.o. female diagnosed with advanced ovarian cancer treated with neoadjuvant chemo and interval debulking in 2013 in [place].  Germline genetic testing normal.    Now with recurrent peritoneal disease documented after CA-125 had been progressively rising for over a year with normal CT scans. Ureteral stent placed due to hydronephrosis. Started chemotherapy 6/20 and CA125 coming down nicely from over 1000 to 86 this week.  She is feeling much better.   Urge incontinence s/p surgery for SUI in the past.  Now uses Mirabegron 50 mg qd (a B-agonist) for urge incontinence per Dr Buel Ream suggestion with good results and no symptoms.   Plan:   Problem List Items Addressed This Visit      Endocrine   Malignant neoplasm of ovary ([place]) - Primary     Chemotherapy  administration schedule based on MITO-7 data which evaluated two study arms:  weekly carboplatin AUC 2 and paclitaxel 60 mg/m versus dose dense carbo-taxol every 3 weeks.  The study randomly assigned 822 patients with stage Ic-IV ovarian cancer who received carboplatin (AUC 6) and paclitaxel (  175 mg/m) every 3 weeks for 6 cycles versus weekly carboplatin AUC 2 and paclitaxel 60 mg/m for 18 consecutive administrations. Progression free survival was slightly, but not significantly longer with weekly regimen (18.8 versus 16.5 months) however, participants on weekly chemotherapy arm showed significantly fewer side effects: Hematologic, toxicity, vomiting, neuropathy, hair loss, and dosing schedules associated with significantly improved quality of life analysis (FACT ovarian scale and FACT/GOG-ntx subscale). Given her frailty and her goal of maintaining quality of life, Dr. Fransisca Connors has recommended continuing weekly dose reduced chemotherapy schedule which she is agreeable to.   She has had over a 90% regression in CA125 after two cycles and will continue treatment to complete 6 cycles and hope CA125 will normalize.  Do not anticipate surgery at any point, as there was no measurable disease.   The patient's diagnosis, an outline of the further diagnostic and laboratory studies which will be required, the recommendation, and alternatives were discussed.  All questions were answered to the patient's satisfaction.  12/01/2018 estimated completion of 6 cycles. Scan then follow up with gyn onc.   Mellody Drown, MD   CC:  Rusty Aus, MD [facility] [address] [place],  [place] [address] [phone]

## 2018-09-19 DIAGNOSIS — R011 Cardiac murmur, unspecified: Secondary | ICD-10-CM | POA: Diagnosis not present

## 2018-09-19 DIAGNOSIS — N1330 Unspecified hydronephrosis: Secondary | ICD-10-CM | POA: Diagnosis not present

## 2018-09-19 DIAGNOSIS — Z950 Presence of cardiac pacemaker: Secondary | ICD-10-CM | POA: Diagnosis not present

## 2018-09-19 DIAGNOSIS — C562 Malignant neoplasm of left ovary: Secondary | ICD-10-CM | POA: Diagnosis not present

## 2018-09-19 DIAGNOSIS — C561 Malignant neoplasm of right ovary: Secondary | ICD-10-CM | POA: Diagnosis not present

## 2018-09-19 DIAGNOSIS — I2510 Atherosclerotic heart disease of native coronary artery without angina pectoris: Secondary | ICD-10-CM | POA: Diagnosis not present

## 2018-09-20 ENCOUNTER — Other Ambulatory Visit: Payer: Self-pay | Admitting: Oncology

## 2018-09-23 DIAGNOSIS — Z950 Presence of cardiac pacemaker: Secondary | ICD-10-CM | POA: Diagnosis not present

## 2018-09-23 DIAGNOSIS — R011 Cardiac murmur, unspecified: Secondary | ICD-10-CM | POA: Diagnosis not present

## 2018-09-23 DIAGNOSIS — I050 Rheumatic mitral stenosis: Secondary | ICD-10-CM | POA: Diagnosis not present

## 2018-09-23 DIAGNOSIS — C561 Malignant neoplasm of right ovary: Secondary | ICD-10-CM | POA: Diagnosis not present

## 2018-09-23 DIAGNOSIS — C562 Malignant neoplasm of left ovary: Secondary | ICD-10-CM | POA: Diagnosis not present

## 2018-09-23 DIAGNOSIS — I2510 Atherosclerotic heart disease of native coronary artery without angina pectoris: Secondary | ICD-10-CM | POA: Diagnosis not present

## 2018-09-23 DIAGNOSIS — N1330 Unspecified hydronephrosis: Secondary | ICD-10-CM | POA: Diagnosis not present

## 2018-09-24 ENCOUNTER — Ambulatory Visit: Payer: Medicare HMO

## 2018-09-24 DIAGNOSIS — Z5329 Procedure and treatment not carried out because of patient's decision for other reasons: Secondary | ICD-10-CM | POA: Diagnosis not present

## 2018-09-24 DIAGNOSIS — Z20828 Contact with and (suspected) exposure to other viral communicable diseases: Secondary | ICD-10-CM | POA: Diagnosis not present

## 2018-09-25 DIAGNOSIS — I2510 Atherosclerotic heart disease of native coronary artery without angina pectoris: Secondary | ICD-10-CM | POA: Diagnosis not present

## 2018-09-25 DIAGNOSIS — C569 Malignant neoplasm of unspecified ovary: Secondary | ICD-10-CM | POA: Diagnosis not present

## 2018-09-25 DIAGNOSIS — N131 Hydronephrosis with ureteral stricture, not elsewhere classified: Secondary | ICD-10-CM | POA: Diagnosis not present

## 2018-09-25 DIAGNOSIS — N138 Other obstructive and reflux uropathy: Secondary | ICD-10-CM | POA: Diagnosis not present

## 2018-09-25 DIAGNOSIS — Z90710 Acquired absence of both cervix and uterus: Secondary | ICD-10-CM | POA: Diagnosis not present

## 2018-09-25 DIAGNOSIS — E782 Mixed hyperlipidemia: Secondary | ICD-10-CM | POA: Diagnosis not present

## 2018-09-25 DIAGNOSIS — N3941 Urge incontinence: Secondary | ICD-10-CM | POA: Diagnosis not present

## 2018-09-25 DIAGNOSIS — Z9221 Personal history of antineoplastic chemotherapy: Secondary | ICD-10-CM | POA: Diagnosis not present

## 2018-09-25 DIAGNOSIS — I442 Atrioventricular block, complete: Secondary | ICD-10-CM | POA: Diagnosis not present

## 2018-09-25 DIAGNOSIS — E039 Hypothyroidism, unspecified: Secondary | ICD-10-CM | POA: Diagnosis not present

## 2018-09-25 DIAGNOSIS — I10 Essential (primary) hypertension: Secondary | ICD-10-CM | POA: Diagnosis not present

## 2018-09-25 DIAGNOSIS — Z955 Presence of coronary angioplasty implant and graft: Secondary | ICD-10-CM | POA: Diagnosis not present

## 2018-09-29 ENCOUNTER — Other Ambulatory Visit: Payer: Self-pay

## 2018-09-29 ENCOUNTER — Inpatient Hospital Stay: Payer: Medicare HMO | Attending: Oncology

## 2018-09-29 ENCOUNTER — Telehealth: Payer: Self-pay

## 2018-09-29 ENCOUNTER — Inpatient Hospital Stay: Payer: Medicare HMO

## 2018-09-29 ENCOUNTER — Encounter: Payer: Self-pay | Admitting: Oncology

## 2018-09-29 ENCOUNTER — Inpatient Hospital Stay (HOSPITAL_BASED_OUTPATIENT_CLINIC_OR_DEPARTMENT_OTHER): Payer: Medicare HMO | Admitting: Oncology

## 2018-09-29 VITALS — BP 134/71 | HR 98 | Temp 98.3°F | Resp 18 | Wt 104.2 lb

## 2018-09-29 DIAGNOSIS — Z95828 Presence of other vascular implants and grafts: Secondary | ICD-10-CM

## 2018-09-29 DIAGNOSIS — C569 Malignant neoplasm of unspecified ovary: Secondary | ICD-10-CM

## 2018-09-29 DIAGNOSIS — Z5111 Encounter for antineoplastic chemotherapy: Secondary | ICD-10-CM | POA: Insufficient documentation

## 2018-09-29 DIAGNOSIS — D649 Anemia, unspecified: Secondary | ICD-10-CM | POA: Insufficient documentation

## 2018-09-29 DIAGNOSIS — E039 Hypothyroidism, unspecified: Secondary | ICD-10-CM | POA: Insufficient documentation

## 2018-09-29 DIAGNOSIS — R32 Unspecified urinary incontinence: Secondary | ICD-10-CM | POA: Diagnosis not present

## 2018-09-29 DIAGNOSIS — R188 Other ascites: Secondary | ICD-10-CM | POA: Diagnosis not present

## 2018-09-29 DIAGNOSIS — Z90710 Acquired absence of both cervix and uterus: Secondary | ICD-10-CM | POA: Diagnosis not present

## 2018-09-29 DIAGNOSIS — I2510 Atherosclerotic heart disease of native coronary artery without angina pectoris: Secondary | ICD-10-CM | POA: Diagnosis not present

## 2018-09-29 DIAGNOSIS — N189 Chronic kidney disease, unspecified: Secondary | ICD-10-CM | POA: Insufficient documentation

## 2018-09-29 DIAGNOSIS — Z7982 Long term (current) use of aspirin: Secondary | ICD-10-CM | POA: Insufficient documentation

## 2018-09-29 DIAGNOSIS — K222 Esophageal obstruction: Secondary | ICD-10-CM | POA: Diagnosis not present

## 2018-09-29 DIAGNOSIS — Z79899 Other long term (current) drug therapy: Secondary | ICD-10-CM | POA: Diagnosis not present

## 2018-09-29 LAB — COMPREHENSIVE METABOLIC PANEL
ALT: 21 U/L (ref 0–44)
AST: 27 U/L (ref 15–41)
Albumin: 3.3 g/dL — ABNORMAL LOW (ref 3.5–5.0)
Alkaline Phosphatase: 84 U/L (ref 38–126)
Anion gap: 9 (ref 5–15)
BUN: 28 mg/dL — ABNORMAL HIGH (ref 8–23)
CO2: 26 mmol/L (ref 22–32)
Calcium: 8.9 mg/dL (ref 8.9–10.3)
Chloride: 105 mmol/L (ref 98–111)
Creatinine, Ser: 0.74 mg/dL (ref 0.44–1.00)
GFR calc Af Amer: >60 mL/min (ref >60)
GFR calc non Af Amer: >60 mL/min (ref >60)
Glucose, Bld: 163 mg/dL — ABNORMAL HIGH (ref 70–99)
Potassium: 4 mmol/L (ref 3.5–5.1)
Sodium: 140 mmol/L (ref 135–145)
Total Bilirubin: 0.4 mg/dL (ref 0.3–1.2)
Total Protein: 6.8 g/dL (ref 6.5–8.1)

## 2018-09-29 LAB — CBC WITH DIFFERENTIAL/PLATELET
Abs Immature Granulocytes: 0.09 10*3/uL — ABNORMAL HIGH (ref 0.00–0.07)
Basophils Absolute: 0.1 10*3/uL (ref 0.0–0.1)
Basophils Relative: 1 %
Eosinophils Absolute: 0.1 10*3/uL (ref 0.0–0.5)
Eosinophils Relative: 2 %
HCT: 30.8 % — ABNORMAL LOW (ref 36.0–46.0)
Hemoglobin: 9.9 g/dL — ABNORMAL LOW (ref 12.0–15.0)
Immature Granulocytes: 1 %
Lymphocytes Relative: 27 %
Lymphs Abs: 1.9 10*3/uL (ref 0.7–4.0)
MCH: 30.7 pg (ref 26.0–34.0)
MCHC: 32.1 g/dL (ref 30.0–36.0)
MCV: 95.4 fL (ref 80.0–100.0)
Monocytes Absolute: 1 10*3/uL (ref 0.1–1.0)
Monocytes Relative: 14 %
Neutro Abs: 3.8 10*3/uL (ref 1.7–7.7)
Neutrophils Relative %: 55 %
Platelets: 299 10*3/uL (ref 150–400)
RBC: 3.23 MIL/uL — ABNORMAL LOW (ref 3.87–5.11)
RDW: 16.6 % — ABNORMAL HIGH (ref 11.5–15.5)
WBC: 6.8 10*3/uL (ref 4.0–10.5)
nRBC: 0 % (ref 0.0–0.2)

## 2018-09-29 MED ORDER — DIPHENHYDRAMINE HCL 50 MG/ML IJ SOLN
12.5000 mg | Freq: Once | INTRAMUSCULAR | Status: AC
  Administered 2018-09-29: 10:00:00 12.5 mg via INTRAVENOUS
  Filled 2018-09-29: qty 1

## 2018-09-29 MED ORDER — FAMOTIDINE IN NACL 20-0.9 MG/50ML-% IV SOLN
20.0000 mg | Freq: Once | INTRAVENOUS | Status: AC
  Administered 2018-09-29: 11:00:00 20 mg via INTRAVENOUS
  Filled 2018-09-29: qty 50

## 2018-09-29 MED ORDER — SODIUM CHLORIDE 0.9 % IV SOLN
60.0000 mg/m2 | Freq: Once | INTRAVENOUS | Status: AC
  Administered 2018-09-29: 84 mg via INTRAVENOUS
  Filled 2018-09-29: qty 14

## 2018-09-29 MED ORDER — SODIUM CHLORIDE 0.9% FLUSH
10.0000 mL | Freq: Once | INTRAVENOUS | Status: AC
  Administered 2018-09-29: 09:00:00 10 mL via INTRAVENOUS
  Filled 2018-09-29: qty 10

## 2018-09-29 MED ORDER — HEPARIN SOD (PORK) LOCK FLUSH 100 UNIT/ML IV SOLN
500.0000 [IU] | Freq: Once | INTRAVENOUS | Status: AC
  Administered 2018-09-29: 500 [IU] via INTRAVENOUS

## 2018-09-29 MED ORDER — SODIUM CHLORIDE 0.9 % IV SOLN
Freq: Once | INTRAVENOUS | Status: AC
  Administered 2018-09-29: 10:00:00 via INTRAVENOUS
  Filled 2018-09-29: qty 250

## 2018-09-29 MED ORDER — SODIUM CHLORIDE 0.9 % IV SOLN
Freq: Once | INTRAVENOUS | Status: AC
  Administered 2018-09-29: 11:00:00 via INTRAVENOUS
  Filled 2018-09-29: qty 5

## 2018-09-29 MED ORDER — PALONOSETRON HCL INJECTION 0.25 MG/5ML
0.2500 mg | Freq: Once | INTRAVENOUS | Status: AC
  Administered 2018-09-29: 11:00:00 0.25 mg via INTRAVENOUS
  Filled 2018-09-29: qty 5

## 2018-09-29 MED ORDER — SODIUM CHLORIDE 0.9 % IV SOLN
85.0500 mg | Freq: Once | INTRAVENOUS | Status: AC
  Administered 2018-09-29: 90 mg via INTRAVENOUS
  Filled 2018-09-29: qty 9

## 2018-09-29 NOTE — Progress Notes (Signed)
Nutrition Follow-up:  Patient with recurrent ovarian cancer currently on chemotherapy.    Spoke with patient during infusion this am.  Patient reports that appetite may be a little bit better.  Reports that she has been enjoying custard that her daughter is making. Also continues to drink 1-2 orgain shakes per day.  Reports that drinks whole milk, likes cottage cheese and fruit and peach milkshakes from [employer].    Denies nausea. Reports regular bowel movement most often.  Does have hard stool at times be takes medication with relief.    Medications: zyprexa added   Labs: reviewed  Anthropometrics:   Weight 104 lb 3.2 oz today increased from 100 lb on 7/6    NUTRITION DIAGNOSIS:  Inadequate oral intake improving with weight gain    INTERVENTION:  Encouraged oral nutrition supplement at least 2 times per day. Reviewed foods high in protein and calories.      MONITORING, EVALUATION, GOAL: Patient will increase calories and protein to prevent weight loss   NEXT VISIT: to be determined  Joseantonio Dittmar B. Zenia Resides, [place], [place] Registered Dietitian [phone] (pager)

## 2018-09-29 NOTE — Progress Notes (Signed)
Carbo dose calculated at 80mg , been on 90mg .  Contacted MD to see if wanted to change dose since >10%.  Keep on current dose of 90mg  per MD

## 2018-09-29 NOTE — Progress Notes (Signed)
Patient is here for follow up, she is doing well, she has irritation in her mouth since 3 days ago.

## 2018-09-29 NOTE — Telephone Encounter (Signed)
Spoke with daughter Tye [place] regarding chemo on 8/31. They will be out of town 8/26-8/31. Instructed when she comes on 8/24 to let make Dr. Janese Banks aware and she will get her schedule arranged around these dates. Spoke with Judeen Hammans regarding plan.

## 2018-09-30 LAB — CA 125: Cancer Antigen (CA) 125: 37.1 U/mL (ref 0.0–38.1)

## 2018-10-03 ENCOUNTER — Other Ambulatory Visit: Payer: Self-pay

## 2018-10-05 NOTE — Progress Notes (Signed)
Hematology/Oncology Consult note [facility]  [phone] [phone]  Patient Care Team: Rusty Aus, MD as PCP - General (Internal Medicine) Clent Jacks, RN as Registered Nurse   Name of the patient: Natalie Stone  [id number]  [birth year]   Date of visit: 10/05/18  Diagnosis- recurrent ovarian cancer  Chief complaint/ Reason for visit- on treatment assessment prior to cycle 9 of weekly carbo taxol chemotherapy  Heme/Onc history: Patient is a 85 year old female with a past medical history significant for stage III ovarian cancer that was treated in [place] and 2013. She underwent neoadjuvant chemotherapy followed by surgery with Dr. Beverly Milch followed by adjuvant chemotherapy. Genetic testing was negative. She then established care at [facility] in 2017. More recently patient was found to have a consistently increasing Ca1 25 level from 42.6 in December 20 18-781 in February 2020. Most recent Ca1 25 was 02/21/2005 on 07/06/2018. Scans were however negative for any recurrence. Patient had CT abdomen and pelvis with contrast done on 07/06/2018 which showed severe left hydro ureteral nephrosis. Sigmoid wall thickening differential includes carcinomatosis and colitis. She did had a repeat scan on 07/09/2018 which showed interval increase in ascites mostly within the pelvis with associated enhancement of the peritoneum suggesting carcinomatosis. Redemonstration of sigmoid wall thickening which may represent colitis versus carcinomatosis. Delayed left nephrogram with severe left hydro-ureteronephrosis similar to prior.  Given findings of colitis as well as ongoing symptoms of melena and diarrhea patient underwent EGD as well as flexible sigmoidoscopy which showed Schatzki's ring and diverticulosis but no evidence of tumor. Her symptoms are possibly secondary to carcinomatosis. She also had other stool studies done including C. difficile and  stool cultures which were negative.Patient did undergo a diagnostic tap of her ascites on 07/09/2018 which was consistent with adenocarcinoma  Plan was to do weekly carbotaxol chemotherapy with carboplatin only for the first 3 weeks to assess patient tolerance. Cycle 1 was started on 07/28/2018.  Interval history- she feels well overall. Denies any complaints today. Her appetite is good and weight is stable  ECOG PS- 1 Pain scale- 0   Review of systems- Review of Systems  Constitutional: Negative for chills, fever, malaise/fatigue and weight loss.  HENT: Negative for congestion, ear discharge and nosebleeds.   Eyes: Negative for blurred vision.  Respiratory: Negative for cough, hemoptysis, sputum production, shortness of breath and wheezing.   Cardiovascular: Negative for chest pain, palpitations, orthopnea and claudication.  Gastrointestinal: Negative for abdominal pain, blood in stool, constipation, diarrhea, heartburn, melena, nausea and vomiting.  Genitourinary: Negative for dysuria, flank pain, frequency, hematuria and urgency.  Musculoskeletal: Negative for back pain, joint pain and myalgias.  Skin: Negative for rash.  Neurological: Negative for dizziness, tingling, focal weakness, seizures, weakness and headaches.  Endo/Heme/Allergies: Does not bruise/bleed easily.  Psychiatric/Behavioral: Negative for depression and suicidal ideas. The patient does not have insomnia.       Allergies  Allergen Reactions  . Penicillins Anaphylaxis    Has patient had a PCN reaction causing immediate rash, facial/tongue/throat swelling, SOB or lightheadedness with hypotension: Yes Has patient had a PCN reaction causing severe rash involving mucus membranes or skin necrosis: No Has patient had a PCN reaction that required hospitalization No Has patient had a PCN reaction occurring within the last 10 years: No If all of the above answers are "NO", then may proceed with Cephalosporin use.      Past Medical History:  Diagnosis Date  . CAD (coronary artery disease)   .  Chronic kidney disease    urinary incontinence  . Hyperthyroidism   . Hypothyroidism (acquired)   . Ovarian cancer ([place]) 2014  . Shingles   . Third degree AV block ([place]) 10/09/2015     Past Surgical History:  Procedure Laterality Date  . ABDOMINAL HYSTERECTOMY    . BLADDER REPAIR    . CORONARY ANGIOPLASTY WITH STENT PLACEMENT  2001   Dr. Isaiah Blakes in [place]  . CYSTOURETHROSCOPY, WITH INSERTION OF INDWELLING URETERAL STENT (EG,GIBBONS OR DOUBLE-J TYPE) (Left Ureter Left   . PACEMAKER INSERTION N/A 10/11/2015   Procedure: INSERTION PACEMAKER;  Surgeon: [name], MD;  Location: [facility] ORS;  Service: Cardiovascular;  Laterality: N/A;  . PARTIAL HYSTERECTOMY    . PORTA CATH INSERTION N/A 07/24/2018   Procedure: PORTA CATH INSERTION;  Surgeon: Algernon Huxley, MD;  Location: [place] [facility];  Service: Cardiovascular;  Laterality: N/A;    Social History   Socioeconomic History  . Marital status: Widowed    Spouse name: Not on file  . Number of children: Not on file  . Years of education: Not on file  . Highest education level: Not on file  Occupational History  . Not on file  Social Needs  . Financial resource strain: Not on file  . Food insecurity    Worry: Not on file    Inability: Not on file  . Transportation needs    Medical: Not on file    Non-medical: Not on file  Tobacco Use  . Smoking status: Never Smoker  . Smokeless tobacco: Never Used  Substance and Sexual Activity  . Alcohol use: No  . Drug use: No  . Sexual activity: Never  Lifestyle  . Physical activity    Days per week: Not on file    Minutes per session: Not on file  . Stress: Not on file  Relationships  . Social Herbalist on phone: Not on file    Gets together: Not on file    Attends religious service: Not on file    Active member of club or organization: Not on file    Attends meetings of clubs or  organizations: Not on file    Relationship status: Not on file  . Intimate partner violence    Fear of current or ex partner: Not on file    Emotionally abused: Not on file    Physically abused: Not on file    Forced sexual activity: Not on file  Other Topics Concern  . 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 Diabetes Brother      Current Outpatient Medications:  .  aspirin EC 81 MG tablet, Take 81 mg by mouth daily., Disp: , Rfl:  .  atorvastatin (LIPITOR) 10 MG tablet, Take 10 mg by mouth at bedtime. , Disp: , Rfl:  .  Bioflavonoid Products (ESTER C PO), Take 500 mg by mouth daily. , Disp: , Rfl:  .  Calcium Carbonate-Vitamin D (CALTRATE 600+D PO), Take 1 tablet by mouth daily., Disp: , Rfl:  .  Cholecalciferol (VITAMIN D3) 1000 units CAPS, Take 1,000 Units by mouth daily. , Disp: , Rfl:  .  Coenzyme Q10 (COQ10) 100 MG CAPS, Take 100 mg by mouth daily., Disp: , Rfl:  .  Glucosamine-MSM-Hyaluronic Acd (JOINT HEALTH PO), Take 1 tablet by mouth 2 (two)  times a day. , Disp: , Rfl:  .  levothyroxine (SYNTHROID) 100 MCG tablet, Take 100 mcg by mouth daily before breakfast. , Disp: , Rfl:  .  mirtazapine (REMERON) 7.5 MG tablet, Take 7.5 mg by mouth at bedtime., Disp: , Rfl:  .  b complex vitamins tablet, Take 1 tablet by mouth daily., Disp: , Rfl:  .  OLANZapine (ZYPREXA) 5 MG tablet, TAKE 1 TABLET(5 MG) BY MOUTH AT BEDTIME (Patient not taking: Reported on 09/29/2018), Disp: 30 tablet, Rfl: 0 .  omeprazole (PRILOSEC) 40 MG capsule, Take 40 mg by mouth daily., Disp: , Rfl:  .  ondansetron (ZOFRAN) 4 MG tablet, Take 1 tablet (4 mg total) by mouth every 8 (eight) hours as needed for nausea or vomiting. (Patient not taking: Reported on 09/17/2018), Disp: 90 tablet, Rfl: 0  Physical exam:  Vitals:   09/29/18 0920  BP: 134/71  Pulse: 98  Resp: 18  Temp: 98.3 F (36.8  C)  TempSrc: Tympanic  SpO2: 100%  Weight: 104 lb 3.2 oz (47.3 kg)   Physical Exam Constitutional:      Comments: Thin elderly woman in no acute distress  HENT:     Head: Normocephalic and atraumatic.  Eyes:     Pupils: Pupils are equal, round, and reactive to light.  Neck:     Musculoskeletal: Normal range of motion.  Cardiovascular:     Rate and Rhythm: Normal rate and regular rhythm.     Heart sounds: Normal heart sounds.  Pulmonary:     Effort: Pulmonary effort is normal.     Breath sounds: Normal breath sounds.  Abdominal:     General: Bowel sounds are normal.     Palpations: Abdomen is soft.  Skin:    General: Skin is warm and dry.  Neurological:     Mental Status: She is alert and oriented to person, place, and time.      CMP Latest Ref Rng & Units 09/29/2018  Glucose 70 - 99 mg/dL 163(H)  BUN 8 - 23 mg/dL 28(H)  Creatinine 0.44 - 1.00 mg/dL 0.74  Sodium 135 - 145 mmol/L 140  Potassium 3.5 - 5.1 mmol/L 4.0  Chloride 98 - 111 mmol/L 105  CO2 22 - 32 mmol/L 26  Calcium 8.9 - 10.3 mg/dL 8.9  Total Protein 6.5 - 8.1 g/dL 6.8  Total Bilirubin 0.3 - 1.2 mg/dL 0.4  Alkaline Phos 38 - 126 U/L 84  AST 15 - 41 U/L 27  ALT 0 - 44 U/L 21   CBC Latest Ref Rng & Units 09/29/2018  WBC 4.0 - 10.5 K/uL 6.8  Hemoglobin 12.0 - 15.0 g/dL 9.9(L)  Hematocrit 36.0 - 46.0 % 30.8(L)  Platelets 150 - 400 K/uL 299     Assessment and plan- Patient is a 85 y.o. female with recurrent ovarian carcinoma. She is here for on treatment assessment prior to cycle 9 of weekly carbo/ taxol chemotherapy.   Counts ok to proceed with cycle 9 of weekly carbo taxol chemotherapy today. She will directly proceed with cycle 10 next week and I will see her back in 2 weeks for cycle 11.   Plan is to do total of 18 cycles. Interim scans have shown stable disease. CA 125 has come down significantly.   Patient has baseline normocytic anemia which has remained stable. She denies any significant  neuropathy presently   Visit Diagnosis 1. Malignant neoplasm of ovary, unspecified laterality ([place])   2. Encounter for antineoplastic chemotherapy  Dr. Randa Evens, MD, MPH [facility] [phone] 10/05/2018 1:28 PM

## 2018-10-06 ENCOUNTER — Inpatient Hospital Stay: Payer: Medicare HMO

## 2018-10-06 ENCOUNTER — Other Ambulatory Visit: Payer: Self-pay

## 2018-10-06 VITALS — BP 122/67 | HR 96 | Temp 97.0°F | Resp 18 | Wt 104.0 lb

## 2018-10-06 DIAGNOSIS — C569 Malignant neoplasm of unspecified ovary: Secondary | ICD-10-CM

## 2018-10-06 DIAGNOSIS — N189 Chronic kidney disease, unspecified: Secondary | ICD-10-CM | POA: Diagnosis not present

## 2018-10-06 DIAGNOSIS — Z5111 Encounter for antineoplastic chemotherapy: Secondary | ICD-10-CM | POA: Diagnosis not present

## 2018-10-06 DIAGNOSIS — K222 Esophageal obstruction: Secondary | ICD-10-CM | POA: Diagnosis not present

## 2018-10-06 DIAGNOSIS — I2510 Atherosclerotic heart disease of native coronary artery without angina pectoris: Secondary | ICD-10-CM | POA: Diagnosis not present

## 2018-10-06 DIAGNOSIS — R188 Other ascites: Secondary | ICD-10-CM | POA: Diagnosis not present

## 2018-10-06 DIAGNOSIS — R32 Unspecified urinary incontinence: Secondary | ICD-10-CM | POA: Diagnosis not present

## 2018-10-06 DIAGNOSIS — D649 Anemia, unspecified: Secondary | ICD-10-CM | POA: Diagnosis not present

## 2018-10-06 DIAGNOSIS — E039 Hypothyroidism, unspecified: Secondary | ICD-10-CM | POA: Diagnosis not present

## 2018-10-06 LAB — COMPREHENSIVE METABOLIC PANEL
ALT: 22 U/L (ref 0–44)
AST: 37 U/L (ref 15–41)
Albumin: 3.5 g/dL (ref 3.5–5.0)
Alkaline Phosphatase: 78 U/L (ref 38–126)
Anion gap: 9 (ref 5–15)
BUN: 26 mg/dL — ABNORMAL HIGH (ref 8–23)
CO2: 27 mmol/L (ref 22–32)
Calcium: 9.1 mg/dL (ref 8.9–10.3)
Chloride: 103 mmol/L (ref 98–111)
Creatinine, Ser: 0.83 mg/dL (ref 0.44–1.00)
GFR calc Af Amer: >60 mL/min (ref >60)
GFR calc non Af Amer: >60 mL/min (ref >60)
Glucose, Bld: 171 mg/dL — ABNORMAL HIGH (ref 70–99)
Potassium: 4 mmol/L (ref 3.5–5.1)
Sodium: 139 mmol/L (ref 135–145)
Total Bilirubin: 0.5 mg/dL (ref 0.3–1.2)
Total Protein: 6.9 g/dL (ref 6.5–8.1)

## 2018-10-06 LAB — CBC WITH DIFFERENTIAL/PLATELET
Abs Immature Granulocytes: 0.08 10*3/uL — ABNORMAL HIGH (ref 0.00–0.07)
Basophils Absolute: 0.1 10*3/uL (ref 0.0–0.1)
Basophils Relative: 1 %
Eosinophils Absolute: 0.2 10*3/uL (ref 0.0–0.5)
Eosinophils Relative: 3 %
HCT: 32.3 % — ABNORMAL LOW (ref 36.0–46.0)
Hemoglobin: 10.6 g/dL — ABNORMAL LOW (ref 12.0–15.0)
Immature Granulocytes: 1 %
Lymphocytes Relative: 29 %
Lymphs Abs: 2.2 10*3/uL (ref 0.7–4.0)
MCH: 31.4 pg (ref 26.0–34.0)
MCHC: 32.8 g/dL (ref 30.0–36.0)
MCV: 95.6 fL (ref 80.0–100.0)
Monocytes Absolute: 0.3 10*3/uL (ref 0.1–1.0)
Monocytes Relative: 5 %
Neutro Abs: 4.6 10*3/uL (ref 1.7–7.7)
Neutrophils Relative %: 61 %
Platelets: 248 10*3/uL (ref 150–400)
RBC: 3.38 MIL/uL — ABNORMAL LOW (ref 3.87–5.11)
RDW: 15.8 % — ABNORMAL HIGH (ref 11.5–15.5)
WBC: 7.5 10*3/uL (ref 4.0–10.5)
nRBC: 0 % (ref 0.0–0.2)

## 2018-10-06 MED ORDER — SODIUM CHLORIDE 0.9 % IV SOLN
Freq: Once | INTRAVENOUS | Status: AC
  Administered 2018-10-06: 10:00:00 via INTRAVENOUS
  Filled 2018-10-06: qty 5

## 2018-10-06 MED ORDER — SODIUM CHLORIDE 0.9 % IV SOLN
Freq: Once | INTRAVENOUS | Status: AC
  Administered 2018-10-06: 09:00:00 via INTRAVENOUS
  Filled 2018-10-06: qty 250

## 2018-10-06 MED ORDER — DIPHENHYDRAMINE HCL 50 MG/ML IJ SOLN
12.5000 mg | Freq: Once | INTRAMUSCULAR | Status: AC
  Administered 2018-10-06: 12.5 mg via INTRAVENOUS
  Filled 2018-10-06: qty 1

## 2018-10-06 MED ORDER — SODIUM CHLORIDE 0.9 % IV SOLN
60.0000 mg/m2 | Freq: Once | INTRAVENOUS | Status: AC
  Administered 2018-10-06: 84 mg via INTRAVENOUS
  Filled 2018-10-06: qty 14

## 2018-10-06 MED ORDER — SODIUM CHLORIDE 0.9 % IV SOLN
85.0500 mg | Freq: Once | INTRAVENOUS | Status: AC
  Administered 2018-10-06: 90 mg via INTRAVENOUS
  Filled 2018-10-06: qty 9

## 2018-10-06 MED ORDER — PALONOSETRON HCL INJECTION 0.25 MG/5ML
0.2500 mg | Freq: Once | INTRAVENOUS | Status: AC
  Administered 2018-10-06: 0.25 mg via INTRAVENOUS
  Filled 2018-10-06: qty 5

## 2018-10-06 MED ORDER — FAMOTIDINE IN NACL 20-0.9 MG/50ML-% IV SOLN
20.0000 mg | Freq: Once | INTRAVENOUS | Status: AC
  Administered 2018-10-06: 20 mg via INTRAVENOUS
  Filled 2018-10-06: qty 50

## 2018-10-06 MED ORDER — HEPARIN SOD (PORK) LOCK FLUSH 100 UNIT/ML IV SOLN
500.0000 [IU] | Freq: Once | INTRAVENOUS | Status: AC | PRN
  Administered 2018-10-06: 13:00:00 500 [IU]
  Filled 2018-10-06: qty 5

## 2018-10-08 ENCOUNTER — Other Ambulatory Visit: Payer: Self-pay | Admitting: *Deleted

## 2018-10-08 DIAGNOSIS — C569 Malignant neoplasm of unspecified ovary: Secondary | ICD-10-CM

## 2018-10-10 ENCOUNTER — Other Ambulatory Visit: Payer: Self-pay

## 2018-10-13 ENCOUNTER — Inpatient Hospital Stay: Payer: Medicare HMO

## 2018-10-13 ENCOUNTER — Encounter: Payer: Self-pay | Admitting: Oncology

## 2018-10-13 ENCOUNTER — Inpatient Hospital Stay (HOSPITAL_BASED_OUTPATIENT_CLINIC_OR_DEPARTMENT_OTHER): Payer: Medicare HMO | Admitting: Oncology

## 2018-10-13 ENCOUNTER — Other Ambulatory Visit: Payer: Self-pay

## 2018-10-13 VITALS — BP 126/71 | HR 90 | Temp 98.6°F | Resp 16 | Ht 61.0 in | Wt 105.3 lb

## 2018-10-13 DIAGNOSIS — Z5111 Encounter for antineoplastic chemotherapy: Secondary | ICD-10-CM | POA: Diagnosis not present

## 2018-10-13 DIAGNOSIS — D649 Anemia, unspecified: Secondary | ICD-10-CM | POA: Diagnosis not present

## 2018-10-13 DIAGNOSIS — I2510 Atherosclerotic heart disease of native coronary artery without angina pectoris: Secondary | ICD-10-CM | POA: Diagnosis not present

## 2018-10-13 DIAGNOSIS — N189 Chronic kidney disease, unspecified: Secondary | ICD-10-CM | POA: Diagnosis not present

## 2018-10-13 DIAGNOSIS — R188 Other ascites: Secondary | ICD-10-CM | POA: Diagnosis not present

## 2018-10-13 DIAGNOSIS — K222 Esophageal obstruction: Secondary | ICD-10-CM | POA: Diagnosis not present

## 2018-10-13 DIAGNOSIS — C569 Malignant neoplasm of unspecified ovary: Secondary | ICD-10-CM | POA: Diagnosis not present

## 2018-10-13 DIAGNOSIS — R32 Unspecified urinary incontinence: Secondary | ICD-10-CM | POA: Diagnosis not present

## 2018-10-13 DIAGNOSIS — E039 Hypothyroidism, unspecified: Secondary | ICD-10-CM | POA: Diagnosis not present

## 2018-10-13 DIAGNOSIS — Z95828 Presence of other vascular implants and grafts: Secondary | ICD-10-CM

## 2018-10-13 LAB — CBC WITH DIFFERENTIAL/PLATELET
Abs Immature Granulocytes: 0.04 10*3/uL (ref 0.00–0.07)
Basophils Absolute: 0.1 10*3/uL (ref 0.0–0.1)
Basophils Relative: 1 %
Eosinophils Absolute: 0.2 10*3/uL (ref 0.0–0.5)
Eosinophils Relative: 3 %
HCT: 30 % — ABNORMAL LOW (ref 36.0–46.0)
Hemoglobin: 9.7 g/dL — ABNORMAL LOW (ref 12.0–15.0)
Immature Granulocytes: 1 %
Lymphocytes Relative: 29 %
Lymphs Abs: 1.7 10*3/uL (ref 0.7–4.0)
MCH: 31.1 pg (ref 26.0–34.0)
MCHC: 32.3 g/dL (ref 30.0–36.0)
MCV: 96.2 fL (ref 80.0–100.0)
Monocytes Absolute: 0.4 10*3/uL (ref 0.1–1.0)
Monocytes Relative: 6 %
Neutro Abs: 3.6 10*3/uL (ref 1.7–7.7)
Neutrophils Relative %: 60 %
Platelets: 203 10*3/uL (ref 150–400)
RBC: 3.12 MIL/uL — ABNORMAL LOW (ref 3.87–5.11)
RDW: 15.9 % — ABNORMAL HIGH (ref 11.5–15.5)
WBC: 5.9 10*3/uL (ref 4.0–10.5)
nRBC: 0 % (ref 0.0–0.2)

## 2018-10-13 LAB — COMPREHENSIVE METABOLIC PANEL
ALT: 23 U/L (ref 0–44)
AST: 27 U/L (ref 15–41)
Albumin: 3.4 g/dL — ABNORMAL LOW (ref 3.5–5.0)
Alkaline Phosphatase: 72 U/L (ref 38–126)
Anion gap: 8 (ref 5–15)
BUN: 26 mg/dL — ABNORMAL HIGH (ref 8–23)
CO2: 28 mmol/L (ref 22–32)
Calcium: 9.3 mg/dL (ref 8.9–10.3)
Chloride: 105 mmol/L (ref 98–111)
Creatinine, Ser: 0.9 mg/dL (ref 0.44–1.00)
GFR calc Af Amer: >60 mL/min (ref >60)
GFR calc non Af Amer: 57 mL/min — ABNORMAL LOW (ref >60)
Glucose, Bld: 116 mg/dL — ABNORMAL HIGH (ref 70–99)
Potassium: 4 mmol/L (ref 3.5–5.1)
Sodium: 141 mmol/L (ref 135–145)
Total Bilirubin: 0.5 mg/dL (ref 0.3–1.2)
Total Protein: 6.9 g/dL (ref 6.5–8.1)

## 2018-10-13 MED ORDER — SODIUM CHLORIDE 0.9 % IV SOLN
85.0500 mg | Freq: Once | INTRAVENOUS | Status: AC
  Administered 2018-10-13: 13:00:00 90 mg via INTRAVENOUS
  Filled 2018-10-13: qty 9

## 2018-10-13 MED ORDER — SODIUM CHLORIDE 0.9 % IV SOLN
60.0000 mg/m2 | Freq: Once | INTRAVENOUS | Status: AC
  Administered 2018-10-13: 84 mg via INTRAVENOUS
  Filled 2018-10-13: qty 14

## 2018-10-13 MED ORDER — DIPHENHYDRAMINE HCL 50 MG/ML IJ SOLN
12.5000 mg | Freq: Once | INTRAMUSCULAR | Status: AC
  Administered 2018-10-13: 10:00:00 12.5 mg via INTRAVENOUS
  Filled 2018-10-13: qty 1

## 2018-10-13 MED ORDER — FAMOTIDINE IN NACL 20-0.9 MG/50ML-% IV SOLN
20.0000 mg | Freq: Once | INTRAVENOUS | Status: AC
  Administered 2018-10-13: 20 mg via INTRAVENOUS
  Filled 2018-10-13: qty 50

## 2018-10-13 MED ORDER — SODIUM CHLORIDE 0.9 % IV SOLN
Freq: Once | INTRAVENOUS | Status: AC
  Administered 2018-10-13: 10:00:00 via INTRAVENOUS
  Filled 2018-10-13: qty 250

## 2018-10-13 MED ORDER — PALONOSETRON HCL INJECTION 0.25 MG/5ML
0.2500 mg | Freq: Once | INTRAVENOUS | Status: AC
  Administered 2018-10-13: 10:00:00 0.25 mg via INTRAVENOUS
  Filled 2018-10-13: qty 5

## 2018-10-13 MED ORDER — HEPARIN SOD (PORK) LOCK FLUSH 100 UNIT/ML IV SOLN
500.0000 [IU] | Freq: Once | INTRAVENOUS | Status: AC | PRN
  Administered 2018-10-13: 500 [IU]
  Filled 2018-10-13: qty 5

## 2018-10-13 MED ORDER — SODIUM CHLORIDE 0.9% FLUSH
10.0000 mL | Freq: Once | INTRAVENOUS | Status: AC
  Administered 2018-10-13: 10 mL via INTRAVENOUS
  Filled 2018-10-13: qty 10

## 2018-10-13 MED ORDER — SODIUM CHLORIDE 0.9 % IV SOLN
Freq: Once | INTRAVENOUS | Status: AC
  Administered 2018-10-13: 11:00:00 via INTRAVENOUS
  Filled 2018-10-13: qty 5

## 2018-10-13 NOTE — Addendum Note (Signed)
Addended by: Luella Cook on: 10/13/2018 10:11 AM   Modules accepted: Orders

## 2018-10-13 NOTE — Progress Notes (Signed)
Pt feels that she is doing ok.

## 2018-10-13 NOTE — Progress Notes (Signed)
Hematology/Oncology Consult note [facility]  [phone] [phone]  Patient Care Team: Rusty Aus, MD as PCP - General (Internal Medicine) Clent Jacks, RN as Registered Nurse   Name of the patient: Natalie Stone  [id number]  [birth year]   Date of visit: 10/13/18  Diagnosis-recurrent ovarian cancer  Chief complaint/ Reason for visit-on treatment assessment prior to cycle 10 of weekly carbotaxol chemotherapy  Heme/Onc history: Patient is a 85 year old female with a past medical history significant for stage III ovarian cancer that was treated in [place] and 2013. She underwent neoadjuvant chemotherapy followed by surgery with Dr. Beverly Milch followed by adjuvant chemotherapy. Genetic testing was negative. She then established care at [facility] in 2017. More recently patient was found to have a consistently increasing Ca1 25 level from 42.6 in December 20 18-781 in February 2020. Most recent Ca1 25 was 02/21/2005 on 07/06/2018. Scans were however negative for any recurrence. Patient had CT abdomen and pelvis with contrast done on 07/06/2018 which showed severe left hydro ureteral nephrosis. Sigmoid wall thickening differential includes carcinomatosis and colitis. She did had a repeat scan on 07/09/2018 which showed interval increase in ascites mostly within the pelvis with associated enhancement of the peritoneum suggesting carcinomatosis. Redemonstration of sigmoid wall thickening which may represent colitis versus carcinomatosis. Delayed left nephrogram with severe left hydro-ureteronephrosis similar to prior.  Given findings of colitis as well as ongoing symptoms of melena and diarrhea patient underwent EGD as well as flexible sigmoidoscopy which showed Schatzki's ring and diverticulosis but no evidence of tumor. Her symptoms are possibly secondary to carcinomatosis. She also had other stool studies done including C. difficile and stool  cultures which were negative.Patient did undergo a diagnostic tap of her ascites on 07/09/2018 which was consistent with adenocarcinoma  Plan was to do weekly carbotaxol chemotherapy with carboplatin only for the first 3 weeks to assess patient tolerance. Cycle 1 was started on 07/28/2018.   Interval history-overall she is doing well.  Her appetite is fairly stable and she denies any diarrhea.  Denies any abdominal pain.  She has chronic fatigue which has remained stable.  Denies any tingling numbness in her extremities.  ECOG PS- 1- Pain scale- 0 Opioid associated constipation- no  Review of systems- Review of Systems  Constitutional: Positive for malaise/fatigue. Negative for chills, fever and weight loss.  HENT: Negative for congestion, ear discharge and nosebleeds.   Eyes: Negative for blurred vision.  Respiratory: Negative for cough, hemoptysis, sputum production, shortness of breath and wheezing.   Cardiovascular: Negative for chest pain, palpitations, orthopnea and claudication.  Gastrointestinal: Negative for abdominal pain, blood in stool, constipation, diarrhea, heartburn, melena, nausea and vomiting.  Genitourinary: Negative for dysuria, flank pain, frequency, hematuria and urgency.  Musculoskeletal: Negative for back pain, joint pain and myalgias.  Skin: Negative for rash.  Neurological: Negative for dizziness, tingling, focal weakness, seizures, weakness and headaches.  Endo/Heme/Allergies: Does not bruise/bleed easily.  Psychiatric/Behavioral: Negative for depression and suicidal ideas. The patient does not have insomnia.        Allergies  Allergen Reactions  . Penicillins Anaphylaxis    Has patient had a PCN reaction causing immediate rash, facial/tongue/throat swelling, SOB or lightheadedness with hypotension: Yes Has patient had a PCN reaction causing severe rash involving mucus membranes or skin necrosis: No Has patient had a PCN reaction that required  hospitalization No Has patient had a PCN reaction occurring within the last 10 years: No If all of the above  answers are "NO", then may proceed with Cephalosporin use.     Past Medical History:  Diagnosis Date  . CAD (coronary artery disease)   . Chronic kidney disease    urinary incontinence  . Hyperthyroidism   . Hypothyroidism (acquired)   . Ovarian cancer ([place]) 2014  . Shingles   . Third degree AV block ([place]) 10/09/2015     Past Surgical History:  Procedure Laterality Date  . ABDOMINAL HYSTERECTOMY    . BLADDER REPAIR    . CORONARY ANGIOPLASTY WITH STENT PLACEMENT  2001   Dr. Isaiah Blakes in [place]  . CYSTOURETHROSCOPY, WITH INSERTION OF INDWELLING URETERAL STENT (EG,GIBBONS OR DOUBLE-J TYPE) (Left Ureter Left   . PACEMAKER INSERTION N/A 10/11/2015   Procedure: INSERTION PACEMAKER;  Surgeon: [name], MD;  Location: [facility] ORS;  Service: Cardiovascular;  Laterality: N/A;  . PARTIAL HYSTERECTOMY    . PORTA CATH INSERTION N/A 07/24/2018   Procedure: PORTA CATH INSERTION;  Surgeon: Algernon Huxley, MD;  Location: [place] [facility];  Service: Cardiovascular;  Laterality: N/A;    Social History   Socioeconomic History  . Marital status: Widowed    Spouse name: Not on file  . Number of children: Not on file  . Years of education: Not on file  . Highest education level: Not on file  Occupational History  . Not on file  Social Needs  . Financial resource strain: Not on file  . Food insecurity    Worry: Not on file    Inability: Not on file  . Transportation needs    Medical: Not on file    Non-medical: Not on file  Tobacco Use  . Smoking status: Never Smoker  . Smokeless tobacco: Never Used  Substance and Sexual Activity  . Alcohol use: No  . Drug use: No  . Sexual activity: Never  Lifestyle  . Physical activity    Days per week: Not on file    Minutes per session: Not on file  . Stress: Not on file  Relationships  . Social Herbalist on phone:  Not on file    Gets together: Not on file    Attends religious service: Not on file    Active member of club or organization: Not on file    Attends meetings of clubs or organizations: Not on file    Relationship status: Not on file  . Intimate partner violence    Fear of current or ex partner: Not on file    Emotionally abused: Not on file    Physically abused: Not on file    Forced sexual activity: Not on file  Other Topics Concern  . 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 Diabetes Brother      Current Outpatient Medications:  .  aspirin EC 81 MG tablet, Take 81 mg by mouth daily., Disp: , Rfl:  .  atorvastatin (LIPITOR) 10 MG tablet, Take 10 mg by mouth at bedtime. , Disp: , Rfl:  .  b complex vitamins tablet, Take 1 tablet by mouth daily., Disp: , Rfl:  .  Bioflavonoid Products (ESTER C PO), Take 500 mg by mouth daily. , Disp: , Rfl:  .  Calcium Carbonate-Vitamin D (CALTRATE 600+D PO), Take 1 tablet by mouth daily., Disp: , Rfl:  .  Cholecalciferol (VITAMIN D3) 1000 units  CAPS, Take 1,000 Units by mouth daily. , Disp: , Rfl:  .  Coenzyme Q10 (COQ10) 100 MG CAPS, Take 100 mg by mouth daily., Disp: , Rfl:  .  Glucosamine-MSM-Hyaluronic Acd (JOINT HEALTH PO), Take 1 tablet by mouth 2 (two) times a day. , Disp: , Rfl:  .  levothyroxine (SYNTHROID) 100 MCG tablet, Take 100 mcg by mouth daily before breakfast. , Disp: , Rfl:  .  mirtazapine (REMERON) 7.5 MG tablet, Take 7.5 mg by mouth at bedtime., Disp: , Rfl:  .  OLANZapine (ZYPREXA) 5 MG tablet, TAKE 1 TABLET(5 MG) BY MOUTH AT BEDTIME (Patient not taking: Reported on 09/29/2018), Disp: 30 tablet, Rfl: 0 .  omeprazole (PRILOSEC) 40 MG capsule, Take 40 mg by mouth daily., Disp: , Rfl:  .  ondansetron (ZOFRAN) 4 MG tablet, Take 1 tablet (4 mg total) by mouth every 8 (eight) hours as needed for nausea or  vomiting. (Patient not taking: Reported on 09/17/2018), Disp: 90 tablet, Rfl: 0  Physical exam:  Vitals:   10/13/18 0911  BP: 126/71  Pulse: 90  Resp: 16  Temp: 98.6 F (37 C)  TempSrc: Tympanic  Weight: 105 lb 4.8 oz (47.8 kg)  Height: 5\' 1"  (1.549 m)   Physical Exam Constitutional:      Comments: Thin elderly frail woman in no acute distress  HENT:     Head: Normocephalic and atraumatic.  Eyes:     Pupils: Pupils are equal, round, and reactive to light.  Neck:     Musculoskeletal: Normal range of motion.  Cardiovascular:     Rate and Rhythm: Normal rate and regular rhythm.     Heart sounds: Normal heart sounds.  Pulmonary:     Effort: Pulmonary effort is normal.     Breath sounds: Normal breath sounds.  Abdominal:     General: Bowel sounds are normal.     Palpations: Abdomen is soft.  Skin:    General: Skin is warm and dry.  Neurological:     Mental Status: She is alert and oriented to person, place, and time.      CMP Latest Ref Rng & Units 10/06/2018  Glucose 70 - 99 mg/dL 171(H)  BUN 8 - 23 mg/dL 26(H)  Creatinine 0.44 - 1.00 mg/dL 0.83  Sodium 135 - 145 mmol/L 139  Potassium 3.5 - 5.1 mmol/L 4.0  Chloride 98 - 111 mmol/L 103  CO2 22 - 32 mmol/L 27  Calcium 8.9 - 10.3 mg/dL 9.1  Total Protein 6.5 - 8.1 g/dL 6.9  Total Bilirubin 0.3 - 1.2 mg/dL 0.5  Alkaline Phos 38 - 126 U/L 78  AST 15 - 41 U/L 37  ALT 0 - 44 U/L 22   CBC Latest Ref Rng & Units 10/06/2018  WBC 4.0 - 10.5 K/uL 7.5  Hemoglobin 12.0 - 15.0 g/dL 10.6(L)  Hematocrit 36.0 - 46.0 % 32.3(L)  Platelets 150 - 400 K/uL 248      Assessment and plan- Patient is a 85 y.o. female with recurrent ovarian carcinoma.  She is here for on treatment assessment prior to cycle 10 of weekly carbotaxol chemotherapy  Counts okay to proceed with cycle 10 of weekly carbotaxol chemotherapy today.  Patient is going out of town for 1 week and will therefore not be receiving chemotherapy next week.  2 weeks from now  is Labor Day holiday.  I will therefore reschedule her chemo to 10/30/2018 and she will see me on that day with port labs CBC with differential, CMP,  Ca1 25 for cycle 11 of carbotaxol chemotherapy.  Plan is to complete 18 cycles   Visit Diagnosis 1. Encounter for antineoplastic chemotherapy   2. Malignant neoplasm of ovary, unspecified laterality ([place])      Dr. Randa Evens, MD, MPH [facility] [id number] 10/13/2018 9:25 AM

## 2018-10-14 LAB — CA 125: Cancer Antigen (CA) 125: 20.8 U/mL (ref 0.0–38.1)

## 2018-10-22 ENCOUNTER — Other Ambulatory Visit: Payer: Self-pay

## 2018-10-22 DIAGNOSIS — Z20822 Contact with and (suspected) exposure to covid-19: Secondary | ICD-10-CM

## 2018-10-23 ENCOUNTER — Other Ambulatory Visit: Payer: Self-pay | Admitting: Nurse Practitioner

## 2018-10-23 LAB — NOVEL CORONAVIRUS, NAA: SARS-CoV-2, NAA: NOT DETECTED

## 2018-10-30 ENCOUNTER — Encounter: Payer: Self-pay | Admitting: Oncology

## 2018-10-30 ENCOUNTER — Inpatient Hospital Stay: Payer: Medicare HMO

## 2018-10-30 ENCOUNTER — Inpatient Hospital Stay: Payer: Medicare HMO | Attending: Oncology | Admitting: Oncology

## 2018-10-30 ENCOUNTER — Other Ambulatory Visit: Payer: Self-pay

## 2018-10-30 VITALS — BP 135/72 | HR 96 | Temp 97.7°F | Ht 61.0 in | Wt 108.0 lb

## 2018-10-30 VITALS — Temp 97.7°F | Resp 19

## 2018-10-30 DIAGNOSIS — Z5111 Encounter for antineoplastic chemotherapy: Secondary | ICD-10-CM | POA: Insufficient documentation

## 2018-10-30 DIAGNOSIS — I2510 Atherosclerotic heart disease of native coronary artery without angina pectoris: Secondary | ICD-10-CM | POA: Insufficient documentation

## 2018-10-30 DIAGNOSIS — C786 Secondary malignant neoplasm of retroperitoneum and peritoneum: Secondary | ICD-10-CM | POA: Insufficient documentation

## 2018-10-30 DIAGNOSIS — Z7982 Long term (current) use of aspirin: Secondary | ICD-10-CM | POA: Diagnosis not present

## 2018-10-30 DIAGNOSIS — I442 Atrioventricular block, complete: Secondary | ICD-10-CM | POA: Insufficient documentation

## 2018-10-30 DIAGNOSIS — C569 Malignant neoplasm of unspecified ovary: Secondary | ICD-10-CM

## 2018-10-30 DIAGNOSIS — N189 Chronic kidney disease, unspecified: Secondary | ICD-10-CM | POA: Diagnosis not present

## 2018-10-30 DIAGNOSIS — Z79899 Other long term (current) drug therapy: Secondary | ICD-10-CM | POA: Insufficient documentation

## 2018-10-30 DIAGNOSIS — E039 Hypothyroidism, unspecified: Secondary | ICD-10-CM | POA: Diagnosis not present

## 2018-10-30 DIAGNOSIS — D649 Anemia, unspecified: Secondary | ICD-10-CM | POA: Diagnosis not present

## 2018-10-30 DIAGNOSIS — Z9221 Personal history of antineoplastic chemotherapy: Secondary | ICD-10-CM | POA: Diagnosis not present

## 2018-10-30 LAB — CBC WITH DIFFERENTIAL/PLATELET
Abs Immature Granulocytes: 0.03 10*3/uL (ref 0.00–0.07)
Basophils Absolute: 0.1 10*3/uL (ref 0.0–0.1)
Basophils Relative: 1 %
Eosinophils Absolute: 0.3 10*3/uL (ref 0.0–0.5)
Eosinophils Relative: 5 %
HCT: 33.3 % — ABNORMAL LOW (ref 36.0–46.0)
Hemoglobin: 10.6 g/dL — ABNORMAL LOW (ref 12.0–15.0)
Immature Granulocytes: 1 %
Lymphocytes Relative: 40 %
Lymphs Abs: 2.3 10*3/uL (ref 0.7–4.0)
MCH: 30.9 pg (ref 26.0–34.0)
MCHC: 31.8 g/dL (ref 30.0–36.0)
MCV: 97.1 fL (ref 80.0–100.0)
Monocytes Absolute: 0.7 10*3/uL (ref 0.1–1.0)
Monocytes Relative: 11 %
Neutro Abs: 2.4 10*3/uL (ref 1.7–7.7)
Neutrophils Relative %: 42 %
Platelets: 258 10*3/uL (ref 150–400)
RBC: 3.43 MIL/uL — ABNORMAL LOW (ref 3.87–5.11)
RDW: 15.2 % (ref 11.5–15.5)
WBC: 5.7 10*3/uL (ref 4.0–10.5)
nRBC: 0 % (ref 0.0–0.2)

## 2018-10-30 LAB — COMPREHENSIVE METABOLIC PANEL
ALT: 20 U/L (ref 0–44)
AST: 24 U/L (ref 15–41)
Albumin: 3.6 g/dL (ref 3.5–5.0)
Alkaline Phosphatase: 72 U/L (ref 38–126)
Anion gap: 9 (ref 5–15)
BUN: 25 mg/dL — ABNORMAL HIGH (ref 8–23)
CO2: 28 mmol/L (ref 22–32)
Calcium: 9.2 mg/dL (ref 8.9–10.3)
Chloride: 105 mmol/L (ref 98–111)
Creatinine, Ser: 0.73 mg/dL (ref 0.44–1.00)
GFR calc Af Amer: >60 mL/min (ref >60)
GFR calc non Af Amer: >60 mL/min (ref >60)
Glucose, Bld: 108 mg/dL — ABNORMAL HIGH (ref 70–99)
Potassium: 3.9 mmol/L (ref 3.5–5.1)
Sodium: 142 mmol/L (ref 135–145)
Total Bilirubin: 0.4 mg/dL (ref 0.3–1.2)
Total Protein: 6.7 g/dL (ref 6.5–8.1)

## 2018-10-30 MED ORDER — DIPHENHYDRAMINE HCL 50 MG/ML IJ SOLN
12.5000 mg | Freq: Once | INTRAMUSCULAR | Status: AC
  Administered 2018-10-30: 12.5 mg via INTRAVENOUS
  Filled 2018-10-30: qty 1

## 2018-10-30 MED ORDER — SODIUM CHLORIDE 0.9% FLUSH
10.0000 mL | Freq: Once | INTRAVENOUS | Status: AC
  Administered 2018-10-30: 09:00:00 10 mL via INTRAVENOUS
  Filled 2018-10-30: qty 10

## 2018-10-30 MED ORDER — SODIUM CHLORIDE 0.9 % IV SOLN
90.0000 mg | Freq: Once | INTRAVENOUS | Status: AC
  Administered 2018-10-30: 13:00:00 90 mg via INTRAVENOUS
  Filled 2018-10-30: qty 9

## 2018-10-30 MED ORDER — FAMOTIDINE IN NACL 20-0.9 MG/50ML-% IV SOLN
20.0000 mg | Freq: Once | INTRAVENOUS | Status: AC
  Administered 2018-10-30: 11:00:00 20 mg via INTRAVENOUS
  Filled 2018-10-30: qty 50

## 2018-10-30 MED ORDER — SODIUM CHLORIDE 0.9 % IV SOLN
60.0000 mg/m2 | Freq: Once | INTRAVENOUS | Status: AC
  Administered 2018-10-30: 84 mg via INTRAVENOUS
  Filled 2018-10-30: qty 14

## 2018-10-30 MED ORDER — PALONOSETRON HCL INJECTION 0.25 MG/5ML
0.2500 mg | Freq: Once | INTRAVENOUS | Status: AC
  Administered 2018-10-30: 0.25 mg via INTRAVENOUS
  Filled 2018-10-30: qty 5

## 2018-10-30 MED ORDER — HEPARIN SOD (PORK) LOCK FLUSH 100 UNIT/ML IV SOLN
500.0000 [IU] | Freq: Once | INTRAVENOUS | Status: AC | PRN
  Administered 2018-10-30: 500 [IU]
  Filled 2018-10-30 (×2): qty 5

## 2018-10-30 MED ORDER — DEXAMETHASONE SODIUM PHOSPHATE 10 MG/ML IJ SOLN
10.0000 mg | Freq: Once | INTRAMUSCULAR | Status: AC
  Administered 2018-10-30: 10 mg via INTRAVENOUS
  Filled 2018-10-30: qty 1

## 2018-10-30 MED ORDER — SODIUM CHLORIDE 0.9 % IV SOLN
Freq: Once | INTRAVENOUS | Status: AC
  Administered 2018-10-30: 10:00:00 via INTRAVENOUS
  Filled 2018-10-30: qty 250

## 2018-10-31 LAB — CA 125: Cancer Antigen (CA) 125: 17.4 U/mL (ref 0.0–38.1)

## 2018-10-31 NOTE — Progress Notes (Signed)
Hematology/Oncology Consult note [facility]  [phone] [phone]  Patient Care Team: Rusty Aus, MD as PCP - General (Internal Medicine) Clent Jacks, RN as Registered Nurse   Name of the patient: Natalie Stone  [id number]  [birth year]   Date of visit: 10/31/18  Diagnosis-recurrent ovarian cancer  Chief complaint/ Reason for visit-on treatment assessment prior to cycle 11 of weekly carbotaxol chemotherapy  Heme/Onc history: Patient is a 85 year old female with a past medical history significant for stage III ovarian cancer that was treated in [place] and 2013. She underwent neoadjuvant chemotherapy followed by surgery with Dr. Beverly Milch followed by adjuvant chemotherapy. Genetic testing was negative. She then established care at [facility] in 2017. More recently patient was found to have a consistently increasing Ca1 25 level from 42.6 in December 20 18-781 in February 2020. Most recent Ca1 25 was 02/21/2005 on 07/06/2018. Scans were however negative for any recurrence. Patient had CT abdomen and pelvis with contrast done on 07/06/2018 which showed severe left hydro ureteral nephrosis. Sigmoid wall thickening differential includes carcinomatosis and colitis. She did had a repeat scan on 07/09/2018 which showed interval increase in ascites mostly within the pelvis with associated enhancement of the peritoneum suggesting carcinomatosis. Redemonstration of sigmoid wall thickening which may represent colitis versus carcinomatosis. Delayed left nephrogram with severe left hydro-ureteronephrosis similar to prior.  Given findings of colitis as well as ongoing symptoms of melena and diarrhea patient underwent EGD as well as flexible sigmoidoscopy which showed Schatzki's ring and diverticulosis but no evidence of tumor. Her symptoms are possibly secondary to carcinomatosis. She also had other stool studies done including C. difficile and stool  cultures which were negative.Patient did undergo a diagnostic tap of her ascites on 07/09/2018 which was consistent with adenocarcinoma  Plan was to do weekly carbotaxol chemotherapy with carboplatin only for the first 3 weeks to assess patient tolerance. Cycle 1 was started on 07/28/2018.   Interval history-patient turned 89 last week and was able to see her whole family to resume eating.  She also went to the beach and had a good time.  Overall she seems to be tolerating her chemotherapy well and denies any tingling numbness in her extremities.  Denies any nausea or vomiting.  Appetite and weight have been stable.  ECOG PS- 1 Pain scale- 0   Review of systems- Review of Systems  Constitutional: Negative for chills, fever, malaise/fatigue and weight loss.  HENT: Negative for congestion, ear discharge and nosebleeds.   Eyes: Negative for blurred vision.  Respiratory: Negative for cough, hemoptysis, sputum production, shortness of breath and wheezing.   Cardiovascular: Negative for chest pain, palpitations, orthopnea and claudication.  Gastrointestinal: Negative for abdominal pain, blood in stool, constipation, diarrhea, heartburn, melena, nausea and vomiting.  Genitourinary: Negative for dysuria, flank pain, frequency, hematuria and urgency.  Musculoskeletal: Negative for back pain, joint pain and myalgias.  Skin: Negative for rash.  Neurological: Negative for dizziness, tingling, focal weakness, seizures, weakness and headaches.  Endo/Heme/Allergies: Does not bruise/bleed easily.  Psychiatric/Behavioral: Negative for depression and suicidal ideas. The patient does not have insomnia.       Allergies  Allergen Reactions  . Penicillins Anaphylaxis    Has patient had a PCN reaction causing immediate rash, facial/tongue/throat swelling, SOB or lightheadedness with hypotension: Yes Has patient had a PCN reaction causing severe rash involving mucus membranes or skin necrosis: No Has patient  had a PCN reaction that required hospitalization No Has patient had  a PCN reaction occurring within the last 10 years: No If all of the above answers are "NO", then may proceed with Cephalosporin use.     Past Medical History:  Diagnosis Date  . CAD (coronary artery disease)   . Chronic kidney disease    urinary incontinence  . Hyperthyroidism   . Hypothyroidism (acquired)   . Ovarian cancer ([place]) 2014  . Shingles   . Third degree AV block ([place]) 10/09/2015     Past Surgical History:  Procedure Laterality Date  . ABDOMINAL HYSTERECTOMY    . BLADDER REPAIR    . CORONARY ANGIOPLASTY WITH STENT PLACEMENT  2001   Dr. Isaiah Blakes in [place]  . CYSTOURETHROSCOPY, WITH INSERTION OF INDWELLING URETERAL STENT (EG,GIBBONS OR DOUBLE-J TYPE) (Left Ureter Left   . PACEMAKER INSERTION N/A 10/11/2015   Procedure: INSERTION PACEMAKER;  Surgeon: [name], MD;  Location: [facility];  Service: Cardiovascular;  Laterality: N/A;  . PARTIAL HYSTERECTOMY    . PORTA CATH INSERTION N/A 07/24/2018   Procedure: PORTA CATH INSERTION;  Surgeon: Algernon Huxley, MD;  Location: [facility];  Service: Cardiovascular;  Laterality: N/A;    Social History   Socioeconomic History  . Marital status: Widowed    Spouse name: Not on file  . Number of children: Not on file  . Years of education: Not on file  . Highest education level: Not on file  Occupational History  . Not on file  Social Needs  . Financial resource strain: Not on file  . Food insecurity    Worry: Not on file    Inability: Not on file  . Transportation needs    Medical: Not on file    Non-medical: Not on file  Tobacco Use  . Smoking status: Never Smoker  . Smokeless tobacco: Never Used  Substance and Sexual Activity  . Alcohol use: No  . Drug use: No  . Sexual activity: Never  Lifestyle  . Physical activity    Days per week: Not on file    Minutes per session: Not on file  . Stress: Not on file  Relationships  . Social  Herbalist on phone: Not on file    Gets together: Not on file    Attends religious service: Not on file    Active member of club or organization: Not on file    Attends meetings of clubs or organizations: Not on file    Relationship status: Not on file  . Intimate partner violence    Fear of current or ex partner: Not on file    Emotionally abused: Not on file    Physically abused: Not on file    Forced sexual activity: Not on file  Other Topics Concern  . 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 Diabetes Brother      Current Outpatient Medications:  .  aspirin EC 81 MG tablet, Take 81 mg by mouth daily., Disp: , Rfl:  .  atorvastatin (LIPITOR) 10 MG tablet, Take 10 mg by mouth at bedtime. , Disp: , Rfl:  .  b complex vitamins tablet, Take 1 tablet by mouth daily., Disp: , Rfl:  .  Bioflavonoid Products (ESTER C PO), Take 500 mg by mouth daily. , Disp: , Rfl:  .  Calcium Carbonate-Vitamin D (CALTRATE 600+D PO), Take 1  tablet by mouth daily., Disp: , Rfl:  .  Cholecalciferol (VITAMIN D3) 1000 units CAPS, Take 1,000 Units by mouth daily. , Disp: , Rfl:  .  Coenzyme Q10 (COQ10) 100 MG CAPS, Take 100 mg by mouth daily., Disp: , Rfl:  .  Digestive Enzymes (DIGESTIVE ENZYME PO), Take 1 Dose by mouth 3 (three) times daily before meals., Disp: , Rfl:  .  Glucosamine-MSM-Hyaluronic Acd (JOINT HEALTH PO), Take 1 tablet by mouth 2 (two) times a day. , Disp: , Rfl:  .  levothyroxine (SYNTHROID) 100 MCG tablet, Take 100 mcg by mouth daily before breakfast. , Disp: , Rfl:  .  mirtazapine (REMERON) 7.5 MG tablet, Take 7.5 mg by mouth at bedtime., Disp: , Rfl:  .  OLANZapine (ZYPREXA) 5 MG tablet, TAKE 1 TABLET(5 MG) BY MOUTH AT BEDTIME, Disp: 30 tablet, Rfl: 0 .  omeprazole (PRILOSEC) 40 MG capsule, Take 40 mg by mouth daily., Disp: , Rfl:  .  ondansetron  (ZOFRAN) 4 MG tablet, TAKE 1 TABLET(4 MG) BY MOUTH EVERY 8 HOURS AS NEEDED FOR NAUSEA OR VOMITING, Disp: 90 tablet, Rfl: 0  Physical exam:  Vitals:   10/30/18 0941 10/30/18 0951  BP:  135/72  Pulse:  96  Temp: 97.7 F (36.5 C)   Weight: 108 lb (49 kg)   Height: 5\' 1"  (1.549 m)    Physical Exam Constitutional:      Comments: Thin elderly female in no acute distress  HENT:     Head: Normocephalic and atraumatic.  Eyes:     Pupils: Pupils are equal, round, and reactive to light.  Neck:     Musculoskeletal: Normal range of motion.  Cardiovascular:     Rate and Rhythm: Normal rate and regular rhythm.     Heart sounds: Normal heart sounds.  Pulmonary:     Effort: Pulmonary effort is normal.     Breath sounds: Normal breath sounds.  Abdominal:     General: Bowel sounds are normal.     Palpations: Abdomen is soft.  Skin:    General: Skin is warm and dry.  Neurological:     Mental Status: She is alert and oriented to person, place, and time.      CMP Latest Ref Rng & Units 10/30/2018  Glucose 70 - 99 mg/dL 108(H)  BUN 8 - 23 mg/dL 25(H)  Creatinine 0.44 - 1.00 mg/dL 0.73  Sodium 135 - 145 mmol/L 142  Potassium 3.5 - 5.1 mmol/L 3.9  Chloride 98 - 111 mmol/L 105  CO2 22 - 32 mmol/L 28  Calcium 8.9 - 10.3 mg/dL 9.2  Total Protein 6.5 - 8.1 g/dL 6.7  Total Bilirubin 0.3 - 1.2 mg/dL 0.4  Alkaline Phos 38 - 126 U/L 72  AST 15 - 41 U/L 24  ALT 0 - 44 U/L 20   CBC Latest Ref Rng & Units 10/30/2018  WBC 4.0 - 10.5 K/uL 5.7  Hemoglobin 12.0 - 15.0 g/dL 10.6(L)  Hematocrit 36.0 - 46.0 % 33.3(L)  Platelets 150 - 400 K/uL 258     Assessment and plan- Patient is a 85 y.o. female with recurrent ovarian carcinoma.  She is here for on treatment assessment prior to cycle 11 of weekly carbotaxol chemotherapy  Counts okay to proceed with cycle 11 of weekly carbotaxol chemotherapy today.  Overall she has tolerated weekly chemotherapy well and plan is to complete 18 cycles. She will  directly proceed for cycle 12 of chemotherapy next week and I will see her back in  2 weeks for cycle 13.  Plan to get interim CT chest abdomen and pelvis with contrast after 12 cycles.  Chronic moderate normocytic anemia: Likely secondary to chronic disease.  Hemoglobin stable around 10.  Continue to monitor   Visit Diagnosis 1. Malignant neoplasm of ovary, unspecified laterality ([place])   2. Encounter for antineoplastic chemotherapy   3. Normocytic anemia      Dr. Randa Evens, MD, MPH [facility] at [facility] [id number] 10/31/2018 8:31 AM

## 2018-11-06 ENCOUNTER — Other Ambulatory Visit: Payer: Self-pay

## 2018-11-06 ENCOUNTER — Inpatient Hospital Stay: Payer: Medicare HMO

## 2018-11-06 VITALS — BP 141/83 | HR 99 | Temp 97.6°F | Resp 20 | Wt 108.4 lb

## 2018-11-06 DIAGNOSIS — I2510 Atherosclerotic heart disease of native coronary artery without angina pectoris: Secondary | ICD-10-CM | POA: Diagnosis not present

## 2018-11-06 DIAGNOSIS — D649 Anemia, unspecified: Secondary | ICD-10-CM | POA: Diagnosis not present

## 2018-11-06 DIAGNOSIS — Z95828 Presence of other vascular implants and grafts: Secondary | ICD-10-CM

## 2018-11-06 DIAGNOSIS — Z9221 Personal history of antineoplastic chemotherapy: Secondary | ICD-10-CM | POA: Diagnosis not present

## 2018-11-06 DIAGNOSIS — C569 Malignant neoplasm of unspecified ovary: Secondary | ICD-10-CM

## 2018-11-06 DIAGNOSIS — I442 Atrioventricular block, complete: Secondary | ICD-10-CM | POA: Diagnosis not present

## 2018-11-06 DIAGNOSIS — Z5111 Encounter for antineoplastic chemotherapy: Secondary | ICD-10-CM | POA: Diagnosis not present

## 2018-11-06 DIAGNOSIS — E039 Hypothyroidism, unspecified: Secondary | ICD-10-CM | POA: Diagnosis not present

## 2018-11-06 DIAGNOSIS — C786 Secondary malignant neoplasm of retroperitoneum and peritoneum: Secondary | ICD-10-CM | POA: Diagnosis not present

## 2018-11-06 DIAGNOSIS — N189 Chronic kidney disease, unspecified: Secondary | ICD-10-CM | POA: Diagnosis not present

## 2018-11-06 LAB — COMPREHENSIVE METABOLIC PANEL
ALT: 20 U/L (ref 0–44)
AST: 24 U/L (ref 15–41)
Albumin: 3.5 g/dL (ref 3.5–5.0)
Alkaline Phosphatase: 70 U/L (ref 38–126)
Anion gap: 9 (ref 5–15)
BUN: 37 mg/dL — ABNORMAL HIGH (ref 8–23)
CO2: 28 mmol/L (ref 22–32)
Calcium: 9.2 mg/dL (ref 8.9–10.3)
Chloride: 104 mmol/L (ref 98–111)
Creatinine, Ser: 0.8 mg/dL (ref 0.44–1.00)
GFR calc Af Amer: >60 mL/min (ref >60)
GFR calc non Af Amer: >60 mL/min (ref >60)
Glucose, Bld: 102 mg/dL — ABNORMAL HIGH (ref 70–99)
Potassium: 3.9 mmol/L (ref 3.5–5.1)
Sodium: 141 mmol/L (ref 135–145)
Total Bilirubin: 0.4 mg/dL (ref 0.3–1.2)
Total Protein: 7.1 g/dL (ref 6.5–8.1)

## 2018-11-06 LAB — CBC WITH DIFFERENTIAL/PLATELET
Abs Immature Granulocytes: 0.05 10*3/uL (ref 0.00–0.07)
Basophils Absolute: 0.1 10*3/uL (ref 0.0–0.1)
Basophils Relative: 1 %
Eosinophils Absolute: 0.4 10*3/uL (ref 0.0–0.5)
Eosinophils Relative: 5 %
HCT: 31.8 % — ABNORMAL LOW (ref 36.0–46.0)
Hemoglobin: 10.3 g/dL — ABNORMAL LOW (ref 12.0–15.0)
Immature Granulocytes: 1 %
Lymphocytes Relative: 32 %
Lymphs Abs: 2.1 10*3/uL (ref 0.7–4.0)
MCH: 31.2 pg (ref 26.0–34.0)
MCHC: 32.4 g/dL (ref 30.0–36.0)
MCV: 96.4 fL (ref 80.0–100.0)
Monocytes Absolute: 0.3 10*3/uL (ref 0.1–1.0)
Monocytes Relative: 4 %
Neutro Abs: 3.7 10*3/uL (ref 1.7–7.7)
Neutrophils Relative %: 57 %
Platelets: 242 10*3/uL (ref 150–400)
RBC: 3.3 MIL/uL — ABNORMAL LOW (ref 3.87–5.11)
RDW: 14.5 % (ref 11.5–15.5)
WBC: 6.5 10*3/uL (ref 4.0–10.5)
nRBC: 0 % (ref 0.0–0.2)

## 2018-11-06 MED ORDER — DEXAMETHASONE SODIUM PHOSPHATE 10 MG/ML IJ SOLN
10.0000 mg | Freq: Once | INTRAMUSCULAR | Status: AC
  Administered 2018-11-06: 11:00:00 10 mg via INTRAVENOUS
  Filled 2018-11-06: qty 1

## 2018-11-06 MED ORDER — FAMOTIDINE IN NACL 20-0.9 MG/50ML-% IV SOLN
20.0000 mg | Freq: Once | INTRAVENOUS | Status: AC
  Administered 2018-11-06: 11:00:00 20 mg via INTRAVENOUS
  Filled 2018-11-06: qty 50

## 2018-11-06 MED ORDER — HEPARIN SOD (PORK) LOCK FLUSH 100 UNIT/ML IV SOLN
500.0000 [IU] | Freq: Once | INTRAVENOUS | Status: AC | PRN
  Administered 2018-11-06: 14:00:00 500 [IU]
  Filled 2018-11-06: qty 5

## 2018-11-06 MED ORDER — DIPHENHYDRAMINE HCL 50 MG/ML IJ SOLN
12.5000 mg | Freq: Once | INTRAMUSCULAR | Status: AC
  Administered 2018-11-06: 11:00:00 12.5 mg via INTRAVENOUS
  Filled 2018-11-06: qty 1

## 2018-11-06 MED ORDER — PALONOSETRON HCL INJECTION 0.25 MG/5ML
0.2500 mg | Freq: Once | INTRAVENOUS | Status: AC
  Administered 2018-11-06: 0.25 mg via INTRAVENOUS
  Filled 2018-11-06: qty 5

## 2018-11-06 MED ORDER — SODIUM CHLORIDE 0.9% FLUSH
10.0000 mL | Freq: Once | INTRAVENOUS | Status: AC
  Administered 2018-11-06: 11:00:00 10 mL via INTRAVENOUS
  Filled 2018-11-06: qty 10

## 2018-11-06 MED ORDER — SODIUM CHLORIDE 0.9 % IV SOLN
60.0000 mg/m2 | Freq: Once | INTRAVENOUS | Status: AC
  Administered 2018-11-06: 84 mg via INTRAVENOUS
  Filled 2018-11-06: qty 14

## 2018-11-06 MED ORDER — SODIUM CHLORIDE 0.9 % IV SOLN
Freq: Once | INTRAVENOUS | Status: AC
  Administered 2018-11-06: 11:00:00 via INTRAVENOUS
  Filled 2018-11-06: qty 250

## 2018-11-06 MED ORDER — SODIUM CHLORIDE 0.9 % IV SOLN
84.1500 mg | Freq: Once | INTRAVENOUS | Status: AC
  Administered 2018-11-06: 13:00:00 80 mg via INTRAVENOUS
  Filled 2018-11-06: qty 8

## 2018-11-06 NOTE — Progress Notes (Signed)
Nutrition Follow-up:  Patient with recurrent ovarian cancer currently on chemotherapy.    Spoke with patient during infusion today.  She reports that her appetite has been better and that she really enjoyed her time spent at the beach recently with family.  Reports ate 2 eggs this am with oat bran muffins.  Daughter cooked salmon with vegetables last night for dinner.  Recently had homemade vegetable beef soup.  Continues to drink orgain shakes.     Medications: reviewed  Labs: reviewed  Anthropometrics:   Weight 108 lb 6 oz today increased from 104 lb 3.2 oz on 8/10   NUTRITION DIAGNOSIS:  Inadequate oral intake improving with weight gain   INTERVENTION:  Encouraged patient to continue high calorie, high protein foods Encouraged patient to continue orgain shakes. Coupons given today to patient.     MONITORING, EVALUATION, GOAL: Patient will increase calories and protein to prevent further weight loss   NEXT VISIT: as needed  Brysan Mcevoy B. Zenia Resides, [place], [place] Registered Dietitian [phone] (pager)

## 2018-11-11 ENCOUNTER — Other Ambulatory Visit: Payer: Self-pay

## 2018-11-11 ENCOUNTER — Ambulatory Visit
Admission: RE | Admit: 2018-11-11 | Discharge: 2018-11-11 | Disposition: A | Discharge status: Home / Self Care | Payer: Medicare HMO | Source: Ambulatory Visit | Attending: Oncology | Admitting: Oncology

## 2018-11-11 DIAGNOSIS — C569 Malignant neoplasm of unspecified ovary: Secondary | ICD-10-CM

## 2018-11-11 DIAGNOSIS — R1909 Other intra-abdominal and pelvic swelling, mass and lump: Secondary | ICD-10-CM | POA: Diagnosis not present

## 2018-11-11 MED ORDER — IOHEXOL 300 MG/ML  SOLN
75.0000 mL | Freq: Once | INTRAMUSCULAR | Status: AC | PRN
  Administered 2018-11-11: 10:00:00 75 mL via INTRAVENOUS

## 2018-11-13 ENCOUNTER — Other Ambulatory Visit: Payer: Medicare HMO

## 2018-11-13 ENCOUNTER — Ambulatory Visit: Payer: Medicare HMO | Admitting: Oncology

## 2018-11-13 NOTE — Progress Notes (Signed)
Called and unable to leave message.

## 2018-11-14 ENCOUNTER — Inpatient Hospital Stay (HOSPITAL_BASED_OUTPATIENT_CLINIC_OR_DEPARTMENT_OTHER): Payer: Medicare HMO | Admitting: Oncology

## 2018-11-14 ENCOUNTER — Other Ambulatory Visit: Payer: Self-pay

## 2018-11-14 ENCOUNTER — Inpatient Hospital Stay: Payer: Medicare HMO

## 2018-11-14 ENCOUNTER — Encounter: Payer: Self-pay | Admitting: Oncology

## 2018-11-14 VITALS — BP 131/62 | HR 93 | Temp 98.0°F | Resp 16 | Ht 61.0 in | Wt 107.8 lb

## 2018-11-14 DIAGNOSIS — E039 Hypothyroidism, unspecified: Secondary | ICD-10-CM | POA: Diagnosis not present

## 2018-11-14 DIAGNOSIS — Z95828 Presence of other vascular implants and grafts: Secondary | ICD-10-CM

## 2018-11-14 DIAGNOSIS — Z5111 Encounter for antineoplastic chemotherapy: Secondary | ICD-10-CM | POA: Diagnosis not present

## 2018-11-14 DIAGNOSIS — C569 Malignant neoplasm of unspecified ovary: Secondary | ICD-10-CM

## 2018-11-14 DIAGNOSIS — D649 Anemia, unspecified: Secondary | ICD-10-CM

## 2018-11-14 DIAGNOSIS — I442 Atrioventricular block, complete: Secondary | ICD-10-CM | POA: Diagnosis not present

## 2018-11-14 DIAGNOSIS — C786 Secondary malignant neoplasm of retroperitoneum and peritoneum: Secondary | ICD-10-CM | POA: Diagnosis not present

## 2018-11-14 DIAGNOSIS — Z9221 Personal history of antineoplastic chemotherapy: Secondary | ICD-10-CM | POA: Diagnosis not present

## 2018-11-14 DIAGNOSIS — N189 Chronic kidney disease, unspecified: Secondary | ICD-10-CM | POA: Diagnosis not present

## 2018-11-14 DIAGNOSIS — I2510 Atherosclerotic heart disease of native coronary artery without angina pectoris: Secondary | ICD-10-CM | POA: Diagnosis not present

## 2018-11-14 LAB — CBC WITH DIFFERENTIAL/PLATELET
Abs Immature Granulocytes: 0.01 10*3/uL (ref 0.00–0.07)
Basophils Absolute: 0 10*3/uL (ref 0.0–0.1)
Basophils Relative: 1 %
Eosinophils Absolute: 0.2 10*3/uL (ref 0.0–0.5)
Eosinophils Relative: 5 %
HCT: 31.1 % — ABNORMAL LOW (ref 36.0–46.0)
Hemoglobin: 10.1 g/dL — ABNORMAL LOW (ref 12.0–15.0)
Immature Granulocytes: 0 %
Lymphocytes Relative: 41 %
Lymphs Abs: 1.7 10*3/uL (ref 0.7–4.0)
MCH: 31.5 pg (ref 26.0–34.0)
MCHC: 32.5 g/dL (ref 30.0–36.0)
MCV: 96.9 fL (ref 80.0–100.0)
Monocytes Absolute: 0.3 10*3/uL (ref 0.1–1.0)
Monocytes Relative: 9 %
Neutro Abs: 1.8 10*3/uL (ref 1.7–7.7)
Neutrophils Relative %: 44 %
Platelets: 282 10*3/uL (ref 150–400)
RBC: 3.21 MIL/uL — ABNORMAL LOW (ref 3.87–5.11)
RDW: 14.2 % (ref 11.5–15.5)
WBC: 4 10*3/uL (ref 4.0–10.5)
nRBC: 0 % (ref 0.0–0.2)

## 2018-11-14 LAB — COMPREHENSIVE METABOLIC PANEL
ALT: 22 U/L (ref 0–44)
AST: 25 U/L (ref 15–41)
Albumin: 3.6 g/dL (ref 3.5–5.0)
Alkaline Phosphatase: 66 U/L (ref 38–126)
Anion gap: 8 (ref 5–15)
BUN: 28 mg/dL — ABNORMAL HIGH (ref 8–23)
CO2: 28 mmol/L (ref 22–32)
Calcium: 9.4 mg/dL (ref 8.9–10.3)
Chloride: 105 mmol/L (ref 98–111)
Creatinine, Ser: 0.82 mg/dL (ref 0.44–1.00)
GFR calc Af Amer: >60 mL/min (ref >60)
GFR calc non Af Amer: >60 mL/min (ref >60)
Glucose, Bld: 119 mg/dL — ABNORMAL HIGH (ref 70–99)
Potassium: 3.9 mmol/L (ref 3.5–5.1)
Sodium: 141 mmol/L (ref 135–145)
Total Bilirubin: 0.4 mg/dL (ref 0.3–1.2)
Total Protein: 7 g/dL (ref 6.5–8.1)

## 2018-11-14 MED ORDER — SODIUM CHLORIDE 0.9 % IV SOLN
60.0000 mg/m2 | Freq: Once | INTRAVENOUS | Status: AC
  Administered 2018-11-14: 84 mg via INTRAVENOUS
  Filled 2018-11-14: qty 14

## 2018-11-14 MED ORDER — HEPARIN SOD (PORK) LOCK FLUSH 100 UNIT/ML IV SOLN
500.0000 [IU] | Freq: Once | INTRAVENOUS | Status: AC | PRN
  Administered 2018-11-14: 500 [IU]
  Filled 2018-11-14: qty 5

## 2018-11-14 MED ORDER — DEXAMETHASONE SODIUM PHOSPHATE 10 MG/ML IJ SOLN
10.0000 mg | Freq: Once | INTRAMUSCULAR | Status: AC
  Administered 2018-11-14: 10 mg via INTRAVENOUS
  Filled 2018-11-14: qty 1

## 2018-11-14 MED ORDER — PALONOSETRON HCL INJECTION 0.25 MG/5ML
0.2500 mg | Freq: Once | INTRAVENOUS | Status: AC
  Administered 2018-11-14: 10:00:00 0.25 mg via INTRAVENOUS
  Filled 2018-11-14: qty 5

## 2018-11-14 MED ORDER — DIPHENHYDRAMINE HCL 50 MG/ML IJ SOLN
12.5000 mg | Freq: Once | INTRAMUSCULAR | Status: AC
  Administered 2018-11-14: 12.5 mg via INTRAVENOUS
  Filled 2018-11-14: qty 1

## 2018-11-14 MED ORDER — SODIUM CHLORIDE 0.9 % IV SOLN
84.1500 mg | Freq: Once | INTRAVENOUS | Status: AC
  Administered 2018-11-14: 80 mg via INTRAVENOUS
  Filled 2018-11-14: qty 8

## 2018-11-14 MED ORDER — SODIUM CHLORIDE 0.9 % IV SOLN
Freq: Once | INTRAVENOUS | Status: AC
  Administered 2018-11-14: 10:00:00 via INTRAVENOUS
  Filled 2018-11-14: qty 250

## 2018-11-14 MED ORDER — FAMOTIDINE IN NACL 20-0.9 MG/50ML-% IV SOLN
20.0000 mg | Freq: Once | INTRAVENOUS | Status: AC
  Administered 2018-11-14: 10:00:00 20 mg via INTRAVENOUS
  Filled 2018-11-14: qty 50

## 2018-11-14 MED ORDER — SODIUM CHLORIDE 0.9% FLUSH
10.0000 mL | Freq: Once | INTRAVENOUS | Status: AC
  Administered 2018-11-14: 10 mL via INTRAVENOUS
  Filled 2018-11-14: qty 10

## 2018-11-14 NOTE — Progress Notes (Signed)
Pt lost about 1/2 lb, she gets hungry sometimes. She drinks 1 ensure in the day time and sometimes 1 right before bedtime. She wants to get her results from ct scan and share with her daughter

## 2018-11-14 NOTE — Progress Notes (Signed)
Hematology/Oncology Consult note [facility]  [phone] [phone]  Patient Care Team: Rusty Aus, MD as PCP - General (Internal Medicine) Clent Jacks, RN as Registered Nurse   Name of the patient: Natalie Stone  [id number]  [birth year]   Date of visit: 11/14/18  Diagnosis-recurrent ovarian cancer  Chief complaint/ Reason for visit-on treatment assessment prior to cycle 13 of weekly carbotaxol chemotherapy  Heme/Onc history: Patient is a 85 year old female with a past medical history significant for stage III ovarian cancer that was treated in [place] and 2013. She underwent neoadjuvant chemotherapy followed by surgery with Dr. Beverly Milch followed by adjuvant chemotherapy. Genetic testing was negative. She then established care at [facility] in 2017. More recently patient was found to have a consistently increasing Ca1 25 level from 42.6 in December 20 18-781 in February 2020. Most recent Ca1 25 was 02/21/2005 on 07/06/2018. Scans were however negative for any recurrence. Patient had CT abdomen and pelvis with contrast done on 07/06/2018 which showed severe left hydro ureteral nephrosis. Sigmoid wall thickening differential includes carcinomatosis and colitis. She did had a repeat scan on 07/09/2018 which showed interval increase in ascites mostly within the pelvis with associated enhancement of the peritoneum suggesting carcinomatosis. Redemonstration of sigmoid wall thickening which may represent colitis versus carcinomatosis. Delayed left nephrogram with severe left hydro-ureteronephrosis similar to prior.  Given findings of colitis as well as ongoing symptoms of melena and diarrhea patient underwent EGD as well as flexible sigmoidoscopy which showed Schatzki's ring and diverticulosis but no evidence of tumor. Her symptoms are possibly secondary to carcinomatosis. She also had other stool studies done including C. difficile and stool  cultures which were negative.Patient did undergo a diagnostic tap of her ascites on 07/09/2018 which was consistent with adenocarcinoma  Plan was to do weekly carbotaxol chemotherapy with carboplatin only for the first 3 weeks to assess patient tolerance. Cycle 1 was started on 07/28/2018.   Interval history-she does have formed bowel movements and does not report any abdominal pain or diarrhea.  Appetite and weight have remained stable.  ECOG PS- 1 Pain scale- 0   Review of systems- Review of Systems  Constitutional: Negative for chills, fever, malaise/fatigue and weight loss.  HENT: Negative for congestion, ear discharge and nosebleeds.   Eyes: Negative for blurred vision.  Respiratory: Negative for cough, hemoptysis, sputum production, shortness of breath and wheezing.   Cardiovascular: Negative for chest pain, palpitations, orthopnea and claudication.  Gastrointestinal: Negative for abdominal pain, blood in stool, constipation, diarrhea, heartburn, melena, nausea and vomiting.  Genitourinary: Negative for dysuria, flank pain, frequency, hematuria and urgency.  Musculoskeletal: Negative for back pain, joint pain and myalgias.  Skin: Negative for rash.  Neurological: Negative for dizziness, tingling, focal weakness, seizures, weakness and headaches.  Endo/Heme/Allergies: Does not bruise/bleed easily.  Psychiatric/Behavioral: Negative for depression and suicidal ideas. The patient does not have insomnia.        Allergies  Allergen Reactions   Penicillins Anaphylaxis    Has patient had a PCN reaction causing immediate rash, facial/tongue/throat swelling, SOB or lightheadedness with hypotension: Yes Has patient had a PCN reaction causing severe rash involving mucus membranes or skin necrosis: No Has patient had a PCN reaction that required hospitalization No Has patient had a PCN reaction occurring within the last 10 years: No If all of the above answers are "NO", then may proceed  with Cephalosporin use.     Past Medical History:  Diagnosis Date  CAD (coronary artery disease)    Chronic kidney disease    urinary incontinence   Hyperthyroidism    Hypothyroidism (acquired)    Ovarian cancer ([place]) 2014   Shingles    Third degree AV block ([place]) 10/09/2015     Past Surgical History:  Procedure Laterality Date   ABDOMINAL HYSTERECTOMY     BLADDER REPAIR     CORONARY ANGIOPLASTY WITH STENT PLACEMENT  2001   Dr. Isaiah Blakes in [place]   CYSTOURETHROSCOPY, WITH INSERTION OF INDWELLING URETERAL STENT (EG,GIBBONS OR DOUBLE-J TYPE) (Left Ureter Left    PACEMAKER INSERTION N/A 10/11/2015   Procedure: INSERTION PACEMAKER;  Surgeon: [name], MD;  Location: [facility];  Service: Cardiovascular;  Laterality: N/A;   PARTIAL HYSTERECTOMY     PORTA CATH INSERTION N/A 07/24/2018   Procedure: PORTA CATH INSERTION;  Surgeon: Algernon Huxley, MD;  Location: [facility];  Service: Cardiovascular;  Laterality: N/A;    Social History   Socioeconomic History   Marital status: Widowed    Spouse name: Not on file   Number of children: Not on file   Years of education: Not on file   Highest education level: Not on file  Occupational History   Not on file  Social Needs   Financial resource strain: Not on file   Food insecurity    Worry: Not on file    Inability: Not on file   Transportation needs    Medical: Not on file    Non-medical: Not on file  Tobacco Use   Smoking status: Never Smoker   Smokeless tobacco: Never Used  Substance and Sexual Activity   Alcohol use: No   Drug use: No   Sexual activity: Never  Lifestyle   Physical activity    Days per week: Not on file    Minutes per session: Not on file   Stress: Not on file  Relationships   Social connections    Talks on phone: Not on file    Gets together: Not on file    Attends religious service: Not on file    Active member of club or organization: Not on file     Attends meetings of clubs or organizations: Not on file    Relationship status: Not on file   Intimate partner violence    Fear of current or ex partner: Not on file    Emotionally abused: Not on file    Physically abused: Not on file    Forced sexual activity: Not on file  Other Topics Concern   Not on file  Social History Narrative   moved from [place] in July 2017; 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 , Disp: , Rfl:    b complex vitamins tablet, Take 1 tablet by mouth daily., Disp: , Rfl:    Bioflavonoid Products (ESTER C PO), Take 500 mg by mouth daily. , Disp: , Rfl:    Calcium Carbonate-Vitamin D (CALTRATE 600+D PO), Take 1 tablet by mouth daily., Disp: , Rfl:    Cholecalciferol (VITAMIN D3) 1000 units CAPS, Take 1,000 Units by mouth daily. , Disp: , Rfl:    Coenzyme Q10 (COQ10) 100 MG CAPS, Take  100 mg by mouth daily., Disp: , Rfl:    Digestive Enzymes (DIGESTIVE ENZYME PO), Take 1 Dose by mouth 3 (three) times daily before meals., Disp: , Rfl:    Glucosamine-MSM-Hyaluronic Acd (JOINT HEALTH PO), Take 1 tablet by mouth 2 (two) times a day. , Disp: , Rfl:    levothyroxine (SYNTHROID) 100 MCG tablet, Take 100 mcg by mouth daily before breakfast. , Disp: , Rfl:    mirtazapine (REMERON) 7.5 MG tablet, Take 7.5 mg by mouth at bedtime., Disp: , Rfl:    OLANZapine (ZYPREXA) 5 MG tablet, TAKE 1 TABLET(5 MG) BY MOUTH AT BEDTIME, Disp: 30 tablet, Rfl: 0   omeprazole (PRILOSEC) 40 MG capsule, Take 40 mg by mouth daily., Disp: , Rfl:    ondansetron (ZOFRAN) 4 MG tablet, TAKE 1 TABLET(4 MG) BY MOUTH EVERY 8 HOURS AS NEEDED FOR NAUSEA OR VOMITING, Disp: 90 tablet, Rfl: 0  Physical exam:  Vitals:   11/14/18 0927  BP: 131/62    Pulse: 93  Resp: 16  Temp: 98 F (36.7 C)  TempSrc: Tympanic  Weight: 107 lb 12.8 oz (48.9 kg)  Height: 5\' 1"  (1.549 m)   Physical Exam Constitutional:      Comments: Frail elderly woman in no acute distress  HENT:     Head: Normocephalic and atraumatic.  Eyes:     Pupils: Pupils are equal, round, and reactive to light.  Neck:     Musculoskeletal: Normal range of motion.  Cardiovascular:     Rate and Rhythm: Normal rate and regular rhythm.     Heart sounds: Normal heart sounds.  Pulmonary:     Effort: Pulmonary effort is normal.     Breath sounds: Normal breath sounds.  Abdominal:     General: Bowel sounds are normal.     Palpations: Abdomen is soft.  Skin:    General: Skin is warm and dry.  Neurological:     Mental Status: She is alert and oriented to person, place, and time.      CMP Latest Ref Rng & Units 11/14/2018  Glucose 70 - 99 mg/dL 119(H)  BUN 8 - 23 mg/dL 28(H)  Creatinine 0.44 - 1.00 mg/dL 0.82  Sodium 135 - 145 mmol/L 141  Potassium 3.5 - 5.1 mmol/L 3.9  Chloride 98 - 111 mmol/L 105  CO2 22 - 32 mmol/L 28  Calcium 8.9 - 10.3 mg/dL 9.4  Total Protein 6.5 - 8.1 g/dL 7.0  Total Bilirubin 0.3 - 1.2 mg/dL 0.4  Alkaline Phos 38 - 126 U/L 66  AST 15 - 41 U/L 25  ALT 0 - 44 U/L 22   CBC Latest Ref Rng & Units 11/14/2018  WBC 4.0 - 10.5 K/uL 4.0  Hemoglobin 12.0 - 15.0 g/dL 10.1(L)  Hematocrit 36.0 - 46.0 % 31.1(L)  Platelets 150 - 400 K/uL 282    No images are attached to the encounter.  Ct Chest W Contrast  Result Date: 11/11/2018 CLINICAL DATA:  Restaging ovarian carcinoma. Regional diagnosis 2014. Two thousand nineteen recurrence. EXAM: CT CHEST, ABDOMEN, AND PELVIS WITH CONTRAST TECHNIQUE: Multidetector CT imaging of the chest, abdomen and pelvis was performed following the standard protocol during bolus administration of intravenous contrast. CONTRAST:  20mL OMNIPAQUE IOHEXOL 300 MG/ML  SOLN COMPARISON:  09/04/2018 and 12/23/2017 FINDINGS: CT CHEST  FINDINGS Cardiovascular: A right-sided Port-A-Cath is noted along with a left-sided pacemaker. No complicating features. The heart is within normal limits in size. No pericardial effusion. Moderate mass effect on the right ventricle due  to pectus deformity. The aorta is normal in caliber. No dissection. Stable atherosclerotic calcifications. The branch vessels are patent. Stable coronary artery calcifications. Mediastinum/Nodes: No mediastinal or hilar mass or adenopathy. The esophagus is grossly normal. Lungs/Pleura: No worrisome pulmonary lesions. Stable band of linear scarring type changes in the right middle lobe. No infiltrates or effusions. No pleural lesions. Musculoskeletal: No breast masses, supraclavicular or axillary adenopathy. The thyroid gland appears normal. The bony structures are unremarkable. No worrisome bone lesions. CT ABDOMEN PELVIS FINDINGS Hepatobiliary: No focal hepatic lesions are identified. No definite peritoneal surface lesions involving the liver. No intra or extrahepatic biliary dilatation. The portal and hepatic veins are patent. Pancreas: No mass, inflammation or ductal dilatation. Spleen: Normal size.  No focal lesions. Adrenals/Urinary Tract: The adrenal glands are unremarkable and stable. Stable mild left-sided hydronephrosis with prominent extrarenal pelvis and a double-J ureteral stent remains in unchanged position coiled in the extrarenal pelvis and coursing down the left ureter and into the bladder. No worrisome renal lesions. No renal or ureteral calculi. No bladder lesions or bladder calculi. Stomach/Bowel: The stomach, duodenum, small bowel and colon are grossly normal. No acute inflammatory process, mass lesions or obstructive findings. Moderate to large amount of stool noted throughout the colon. Vascular/Lymphatic: Advanced atherosclerotic calcifications involving the aorta and iliac arteries but no aneurysm. Branch vessels are patent. The major venous structures are  patent. No mesenteric or retroperitoneal mass or adenopathy. I do not see any omental disease. Two small enhancing nodules adjacent to the descending colon could be stable peritoneal nodules or diverticuli. Stable bilobed cystic lesion in the right pelvis measuring approximately 3.4 x 1.8 cm. No other pelvic lesions or obvious peritoneal implants in the pelvis. No pelvic adenopathy. No inguinal adenopathy. Reproductive: Surgically absent. Other: No free abdominal/pelvic fluid collections. Musculoskeletal: No significant bony findings. IMPRESSION: 1. Stable bilobed cystic lesion in the right pelvis. 2. Resolution of ascites. 3. Two small enhancing nodules in the left abdomen adjacent to the ascending colon could be small stable peritoneal nodules or diverticuli. 4. No omental or peritoneal surface lesions are identified elsewhere. 5. No findings for metastatic disease involving the chest. 6. Stable advanced atherosclerotic vascular calcifications in the chest, abdomen and pelvis. 7. Stable left-sided double-J ureteral stent with minimal residual left-sided hydronephrosis. Electronically Signed   By: Marijo Sanes M.D.   On: 11/11/2018 13:12   Ct Abdomen Pelvis W Contrast  Result Date: 11/11/2018 CLINICAL DATA:  Restaging ovarian carcinoma. Regional diagnosis 2014. Two thousand nineteen recurrence. EXAM: CT CHEST, ABDOMEN, AND PELVIS WITH CONTRAST TECHNIQUE: Multidetector CT imaging of the chest, abdomen and pelvis was performed following the standard protocol during bolus administration of intravenous contrast. CONTRAST:  92mL OMNIPAQUE IOHEXOL 300 MG/ML  SOLN COMPARISON:  09/04/2018 and 12/23/2017 FINDINGS: CT CHEST FINDINGS Cardiovascular: A right-sided Port-A-Cath is noted along with a left-sided pacemaker. No complicating features. The heart is within normal limits in size. No pericardial effusion. Moderate mass effect on the right ventricle due to pectus deformity. The aorta is normal in caliber. No  dissection. Stable atherosclerotic calcifications. The branch vessels are patent. Stable coronary artery calcifications. Mediastinum/Nodes: No mediastinal or hilar mass or adenopathy. The esophagus is grossly normal. Lungs/Pleura: No worrisome pulmonary lesions. Stable band of linear scarring type changes in the right middle lobe. No infiltrates or effusions. No pleural lesions. Musculoskeletal: No breast masses, supraclavicular or axillary adenopathy. The thyroid gland appears normal. The bony structures are unremarkable. No worrisome bone lesions. CT ABDOMEN PELVIS FINDINGS Hepatobiliary: No focal  hepatic lesions are identified. No definite peritoneal surface lesions involving the liver. No intra or extrahepatic biliary dilatation. The portal and hepatic veins are patent. Pancreas: No mass, inflammation or ductal dilatation. Spleen: Normal size.  No focal lesions. Adrenals/Urinary Tract: The adrenal glands are unremarkable and stable. Stable mild left-sided hydronephrosis with prominent extrarenal pelvis and a double-J ureteral stent remains in unchanged position coiled in the extrarenal pelvis and coursing down the left ureter and into the bladder. No worrisome renal lesions. No renal or ureteral calculi. No bladder lesions or bladder calculi. Stomach/Bowel: The stomach, duodenum, small bowel and colon are grossly normal. No acute inflammatory process, mass lesions or obstructive findings. Moderate to large amount of stool noted throughout the colon. Vascular/Lymphatic: Advanced atherosclerotic calcifications involving the aorta and iliac arteries but no aneurysm. Branch vessels are patent. The major venous structures are patent. No mesenteric or retroperitoneal mass or adenopathy. I do not see any omental disease. Two small enhancing nodules adjacent to the descending colon could be stable peritoneal nodules or diverticuli. Stable bilobed cystic lesion in the right pelvis measuring approximately 3.4 x 1.8 cm. No  other pelvic lesions or obvious peritoneal implants in the pelvis. No pelvic adenopathy. No inguinal adenopathy. Reproductive: Surgically absent. Other: No free abdominal/pelvic fluid collections. Musculoskeletal: No significant bony findings. IMPRESSION: 1. Stable bilobed cystic lesion in the right pelvis. 2. Resolution of ascites. 3. Two small enhancing nodules in the left abdomen adjacent to the ascending colon could be small stable peritoneal nodules or diverticuli. 4. No omental or peritoneal surface lesions are identified elsewhere. 5. No findings for metastatic disease involving the chest. 6. Stable advanced atherosclerotic vascular calcifications in the chest, abdomen and pelvis. 7. Stable left-sided double-J ureteral stent with minimal residual left-sided hydronephrosis. Electronically Signed   By: Marijo Sanes M.D.   On: 11/11/2018 13:12     Assessment and plan- Patient is a 86 y.o. female with recurrent ovarian carcinoma.  She is here for on treatment assessment prior to cycle 13 of weekly carbotaxol chemotherapy today.  I have reviewed CT chest abdomen pelvis images independently and discussed findings with the patient.  She had evidence of peritoneal carcinomatosis as well as ascites on her prior scans which has resolved on the present scan.  There were 2 small enhancing nodules noted in the left abdomen which may be stable peritoneal nodules.  No other findings of metastatic disease.  Her Ca1 25 has normalized to 17.  Plan is to complete 18 cycles of weekly carbotaxol chemotherapy.  Counts okay to proceed with cycle 13 of chemotherapy today.  She will directly proceed for cycle 14 next week and I will see her back in 2 weeks for cycle 15 of carbotaxol chemotherapy  Patient has chronic normocytic anemia but her hemoglobin has remained stable around 10.  Continue to monitor  I will update patient daughter later today as well.   Visit Diagnosis 1. Malignant neoplasm of ovary, unspecified  laterality ([place])   2. Encounter for antineoplastic chemotherapy   3. Normocytic anemia      Dr. Randa Evens, MD, MPH [facility] at [facility] [id number] 11/14/2018 9:42 AM

## 2018-11-15 LAB — CA 125: Cancer Antigen (CA) 125: 14.7 U/mL (ref 0.0–38.1)

## 2018-11-18 DIAGNOSIS — R8290 Unspecified abnormal findings in urine: Secondary | ICD-10-CM | POA: Diagnosis not present

## 2018-11-18 DIAGNOSIS — R300 Dysuria: Secondary | ICD-10-CM | POA: Diagnosis not present

## 2018-11-21 ENCOUNTER — Inpatient Hospital Stay: Payer: Medicare HMO

## 2018-11-21 ENCOUNTER — Inpatient Hospital Stay: Payer: Medicare HMO | Attending: Oncology

## 2018-11-21 ENCOUNTER — Other Ambulatory Visit: Payer: Self-pay

## 2018-11-21 ENCOUNTER — Other Ambulatory Visit: Payer: Self-pay | Admitting: Oncology

## 2018-11-21 VITALS — BP 139/72 | HR 99 | Temp 98.2°F | Resp 18

## 2018-11-21 DIAGNOSIS — Z87440 Personal history of urinary (tract) infections: Secondary | ICD-10-CM | POA: Insufficient documentation

## 2018-11-21 DIAGNOSIS — Z95828 Presence of other vascular implants and grafts: Secondary | ICD-10-CM

## 2018-11-21 DIAGNOSIS — E039 Hypothyroidism, unspecified: Secondary | ICD-10-CM | POA: Insufficient documentation

## 2018-11-21 DIAGNOSIS — N189 Chronic kidney disease, unspecified: Secondary | ICD-10-CM | POA: Insufficient documentation

## 2018-11-21 DIAGNOSIS — C569 Malignant neoplasm of unspecified ovary: Secondary | ICD-10-CM

## 2018-11-21 DIAGNOSIS — D649 Anemia, unspecified: Secondary | ICD-10-CM | POA: Insufficient documentation

## 2018-11-21 DIAGNOSIS — Z79899 Other long term (current) drug therapy: Secondary | ICD-10-CM | POA: Insufficient documentation

## 2018-11-21 DIAGNOSIS — Z5111 Encounter for antineoplastic chemotherapy: Secondary | ICD-10-CM | POA: Insufficient documentation

## 2018-11-21 LAB — COMPREHENSIVE METABOLIC PANEL
ALT: 19 U/L (ref 0–44)
AST: 27 U/L (ref 15–41)
Albumin: 3.5 g/dL (ref 3.5–5.0)
Alkaline Phosphatase: 57 U/L (ref 38–126)
Anion gap: 8 (ref 5–15)
BUN: 22 mg/dL (ref 8–23)
CO2: 27 mmol/L (ref 22–32)
Calcium: 9.5 mg/dL (ref 8.9–10.3)
Chloride: 104 mmol/L (ref 98–111)
Creatinine, Ser: 0.86 mg/dL (ref 0.44–1.00)
GFR calc Af Amer: >60 mL/min (ref >60)
GFR calc non Af Amer: 59 mL/min — ABNORMAL LOW (ref >60)
Glucose, Bld: 158 mg/dL — ABNORMAL HIGH (ref 70–99)
Potassium: 3.9 mmol/L (ref 3.5–5.1)
Sodium: 139 mmol/L (ref 135–145)
Total Bilirubin: 0.4 mg/dL (ref 0.3–1.2)
Total Protein: 6.7 g/dL (ref 6.5–8.1)

## 2018-11-21 LAB — CBC WITH DIFFERENTIAL/PLATELET
Abs Immature Granulocytes: 0.02 10*3/uL (ref 0.00–0.07)
Basophils Absolute: 0 10*3/uL (ref 0.0–0.1)
Basophils Relative: 1 %
Eosinophils Absolute: 0.1 10*3/uL (ref 0.0–0.5)
Eosinophils Relative: 3 %
HCT: 31 % — ABNORMAL LOW (ref 36.0–46.0)
Hemoglobin: 10 g/dL — ABNORMAL LOW (ref 12.0–15.0)
Immature Granulocytes: 1 %
Lymphocytes Relative: 34 %
Lymphs Abs: 1.4 10*3/uL (ref 0.7–4.0)
MCH: 31 pg (ref 26.0–34.0)
MCHC: 32.3 g/dL (ref 30.0–36.0)
MCV: 96 fL (ref 80.0–100.0)
Monocytes Absolute: 0.3 10*3/uL (ref 0.1–1.0)
Monocytes Relative: 6 %
Neutro Abs: 2.2 10*3/uL (ref 1.7–7.7)
Neutrophils Relative %: 55 %
Platelets: 292 10*3/uL (ref 150–400)
RBC: 3.23 MIL/uL — ABNORMAL LOW (ref 3.87–5.11)
RDW: 13.9 % (ref 11.5–15.5)
WBC: 4 10*3/uL (ref 4.0–10.5)
nRBC: 0 % (ref 0.0–0.2)

## 2018-11-21 MED ORDER — SODIUM CHLORIDE 0.9 % IV SOLN
Freq: Once | INTRAVENOUS | Status: AC
  Administered 2018-11-21: 11:00:00 via INTRAVENOUS
  Filled 2018-11-21: qty 250

## 2018-11-21 MED ORDER — SODIUM CHLORIDE 0.9% FLUSH
10.0000 mL | Freq: Once | INTRAVENOUS | Status: AC
  Administered 2018-11-21: 10 mL via INTRAVENOUS
  Filled 2018-11-21: qty 10

## 2018-11-21 MED ORDER — PALONOSETRON HCL INJECTION 0.25 MG/5ML
0.2500 mg | Freq: Once | INTRAVENOUS | Status: AC
  Administered 2018-11-21: 11:00:00 0.25 mg via INTRAVENOUS
  Filled 2018-11-21: qty 5

## 2018-11-21 MED ORDER — SODIUM CHLORIDE 0.9 % IV SOLN
60.0000 mg/m2 | Freq: Once | INTRAVENOUS | Status: AC
  Administered 2018-11-21: 84 mg via INTRAVENOUS
  Filled 2018-11-21: qty 14

## 2018-11-21 MED ORDER — DEXAMETHASONE SODIUM PHOSPHATE 10 MG/ML IJ SOLN
10.0000 mg | Freq: Once | INTRAMUSCULAR | Status: AC
  Administered 2018-11-21: 10 mg via INTRAVENOUS
  Filled 2018-11-21: qty 1

## 2018-11-21 MED ORDER — SODIUM CHLORIDE 0.9 % IV SOLN
84.1500 mg | Freq: Once | INTRAVENOUS | Status: AC
  Administered 2018-11-21: 80 mg via INTRAVENOUS
  Filled 2018-11-21: qty 8

## 2018-11-21 MED ORDER — FAMOTIDINE IN NACL 20-0.9 MG/50ML-% IV SOLN
20.0000 mg | Freq: Once | INTRAVENOUS | Status: AC
  Administered 2018-11-21: 12:00:00 20 mg via INTRAVENOUS
  Filled 2018-11-21: qty 50

## 2018-11-21 MED ORDER — HEPARIN SOD (PORK) LOCK FLUSH 100 UNIT/ML IV SOLN
500.0000 [IU] | Freq: Once | INTRAVENOUS | Status: AC | PRN
  Administered 2018-11-21: 500 [IU]

## 2018-11-21 NOTE — Progress Notes (Signed)
Are you oncologist call her and over I will have advised I will just go ahead and

## 2018-11-21 NOTE — Progress Notes (Signed)
Patient experienced restless legs with last treatment after benadryl administration. Benadryl dosed at 12.5mg . Per MD removed benadryl from all future doses.

## 2018-11-22 ENCOUNTER — Other Ambulatory Visit: Payer: Self-pay | Admitting: Nurse Practitioner

## 2018-11-22 LAB — CA 125: Cancer Antigen (CA) 125: 14 U/mL (ref 0.0–38.1)

## 2018-11-27 ENCOUNTER — Other Ambulatory Visit: Payer: Self-pay

## 2018-11-27 DIAGNOSIS — C569 Malignant neoplasm of unspecified ovary: Secondary | ICD-10-CM

## 2018-11-28 ENCOUNTER — Encounter: Payer: Self-pay | Admitting: Oncology

## 2018-11-28 ENCOUNTER — Other Ambulatory Visit: Payer: Self-pay

## 2018-11-28 ENCOUNTER — Inpatient Hospital Stay: Payer: Medicare HMO

## 2018-11-28 ENCOUNTER — Inpatient Hospital Stay (HOSPITAL_BASED_OUTPATIENT_CLINIC_OR_DEPARTMENT_OTHER): Payer: Medicare HMO | Admitting: Oncology

## 2018-11-28 VITALS — BP 159/77 | HR 98 | Resp 20

## 2018-11-28 VITALS — BP 158/74 | HR 108 | Temp 98.4°F | Ht 61.0 in | Wt 108.0 lb

## 2018-11-28 DIAGNOSIS — C569 Malignant neoplasm of unspecified ovary: Secondary | ICD-10-CM

## 2018-11-28 DIAGNOSIS — Z79899 Other long term (current) drug therapy: Secondary | ICD-10-CM | POA: Diagnosis not present

## 2018-11-28 DIAGNOSIS — D649 Anemia, unspecified: Secondary | ICD-10-CM | POA: Diagnosis not present

## 2018-11-28 DIAGNOSIS — Z95828 Presence of other vascular implants and grafts: Secondary | ICD-10-CM

## 2018-11-28 DIAGNOSIS — E039 Hypothyroidism, unspecified: Secondary | ICD-10-CM | POA: Diagnosis not present

## 2018-11-28 DIAGNOSIS — N189 Chronic kidney disease, unspecified: Secondary | ICD-10-CM | POA: Diagnosis not present

## 2018-11-28 DIAGNOSIS — Z5111 Encounter for antineoplastic chemotherapy: Secondary | ICD-10-CM

## 2018-11-28 DIAGNOSIS — Z87440 Personal history of urinary (tract) infections: Secondary | ICD-10-CM | POA: Diagnosis not present

## 2018-11-28 LAB — CBC WITH DIFFERENTIAL/PLATELET
Abs Immature Granulocytes: 0.04 10*3/uL (ref 0.00–0.07)
Basophils Absolute: 0 10*3/uL (ref 0.0–0.1)
Basophils Relative: 1 %
Eosinophils Absolute: 0.1 10*3/uL (ref 0.0–0.5)
Eosinophils Relative: 2 %
HCT: 30.9 % — ABNORMAL LOW (ref 36.0–46.0)
Hemoglobin: 10.1 g/dL — ABNORMAL LOW (ref 12.0–15.0)
Immature Granulocytes: 1 %
Lymphocytes Relative: 41 %
Lymphs Abs: 1.7 10*3/uL (ref 0.7–4.0)
MCH: 31.3 pg (ref 26.0–34.0)
MCHC: 32.7 g/dL (ref 30.0–36.0)
MCV: 95.7 fL (ref 80.0–100.0)
Monocytes Absolute: 0.2 10*3/uL (ref 0.1–1.0)
Monocytes Relative: 6 %
Neutro Abs: 2 10*3/uL (ref 1.7–7.7)
Neutrophils Relative %: 49 %
Platelets: 279 10*3/uL (ref 150–400)
RBC: 3.23 MIL/uL — ABNORMAL LOW (ref 3.87–5.11)
RDW: 13.9 % (ref 11.5–15.5)
WBC: 4 10*3/uL (ref 4.0–10.5)
nRBC: 0 % (ref 0.0–0.2)

## 2018-11-28 LAB — COMPREHENSIVE METABOLIC PANEL
ALT: 19 U/L (ref 0–44)
AST: 25 U/L (ref 15–41)
Albumin: 3.5 g/dL (ref 3.5–5.0)
Alkaline Phosphatase: 58 U/L (ref 38–126)
Anion gap: 7 (ref 5–15)
BUN: 27 mg/dL — ABNORMAL HIGH (ref 8–23)
CO2: 29 mmol/L (ref 22–32)
Calcium: 9.4 mg/dL (ref 8.9–10.3)
Chloride: 105 mmol/L (ref 98–111)
Creatinine, Ser: 0.83 mg/dL (ref 0.44–1.00)
GFR calc Af Amer: >60 mL/min (ref >60)
GFR calc non Af Amer: >60 mL/min (ref >60)
Glucose, Bld: 128 mg/dL — ABNORMAL HIGH (ref 70–99)
Potassium: 3.9 mmol/L (ref 3.5–5.1)
Sodium: 141 mmol/L (ref 135–145)
Total Bilirubin: 0.5 mg/dL (ref 0.3–1.2)
Total Protein: 6.9 g/dL (ref 6.5–8.1)

## 2018-11-28 MED ORDER — HEPARIN SOD (PORK) LOCK FLUSH 100 UNIT/ML IV SOLN
500.0000 [IU] | Freq: Once | INTRAVENOUS | Status: AC | PRN
  Administered 2018-11-28: 14:00:00 500 [IU]
  Filled 2018-11-28: qty 5

## 2018-11-28 MED ORDER — PALONOSETRON HCL INJECTION 0.25 MG/5ML
0.2500 mg | Freq: Once | INTRAVENOUS | Status: AC
  Administered 2018-11-28: 10:00:00 0.25 mg via INTRAVENOUS
  Filled 2018-11-28: qty 5

## 2018-11-28 MED ORDER — SODIUM CHLORIDE 0.9 % IV SOLN
Freq: Once | INTRAVENOUS | Status: AC
  Administered 2018-11-28: 1000 mL via INTRAVENOUS
  Filled 2018-11-28: qty 250

## 2018-11-28 MED ORDER — SODIUM CHLORIDE 0.9 % IV SOLN
84.1500 mg | Freq: Once | INTRAVENOUS | Status: AC
  Administered 2018-11-28: 80 mg via INTRAVENOUS
  Filled 2018-11-28: qty 8

## 2018-11-28 MED ORDER — SODIUM CHLORIDE 0.9% FLUSH
10.0000 mL | Freq: Once | INTRAVENOUS | Status: AC
  Administered 2018-11-28: 10 mL via INTRAVENOUS
  Filled 2018-11-28: qty 10

## 2018-11-28 MED ORDER — FAMOTIDINE IN NACL 20-0.9 MG/50ML-% IV SOLN
20.0000 mg | Freq: Once | INTRAVENOUS | Status: AC
  Administered 2018-11-28: 20 mg via INTRAVENOUS
  Filled 2018-11-28: qty 50

## 2018-11-28 MED ORDER — DEXAMETHASONE SODIUM PHOSPHATE 10 MG/ML IJ SOLN
10.0000 mg | Freq: Once | INTRAMUSCULAR | Status: AC
  Administered 2018-11-28: 10 mg via INTRAVENOUS
  Filled 2018-11-28: qty 1

## 2018-11-28 MED ORDER — SODIUM CHLORIDE 0.9 % IV SOLN
60.0000 mg/m2 | Freq: Once | INTRAVENOUS | Status: AC
  Administered 2018-11-28: 12:00:00 84 mg via INTRAVENOUS
  Filled 2018-11-28: qty 14

## 2018-11-28 MED ORDER — SODIUM CHLORIDE 0.9 % IV SOLN
Freq: Once | INTRAVENOUS | Status: AC
  Administered 2018-11-28: 10:00:00 via INTRAVENOUS
  Filled 2018-11-28: qty 250

## 2018-11-28 NOTE — Progress Notes (Signed)
Patient stated that she had been doing well with no complaints. 

## 2018-11-28 NOTE — Progress Notes (Signed)
Hematology/Oncology Consult note [facility]  [phone] [phone]  Patient Care Team: Rusty Aus, MD as PCP - General (Internal Medicine) Clent Jacks, RN as Registered Nurse   Name of the patient: Natalie Stone  [id number]  07-31-1926   Date of visit: 11/28/18  Diagnosis- recurrent ovarian cancer  Chief complaint/ Reason for visit-on treatment assessment prior to cycle 15 of weekly carbotaxol chemotherapy  Heme/Onc history: Patient is a 85 year old female with a past medical history significant for stage III ovarian cancer that was treated in [place] and 2013. She underwent neoadjuvant chemotherapy followed by surgery with Dr. Beverly Milch followed by adjuvant chemotherapy. Genetic testing was negative. She then established care at [facility] in 2017. More recently patient was found to have a consistently increasing Ca1 25 level from 42.6 in December 20 18-781 in February 2020. Most recent Ca1 25 was 02/21/2005 on 07/06/2018. Scans were however negative for any recurrence. Patient had CT abdomen and pelvis with contrast done on 07/06/2018 which showed severe left hydro ureteral nephrosis. Sigmoid wall thickening differential includes carcinomatosis and colitis. She did had a repeat scan on 07/09/2018 which showed interval increase in ascites mostly within the pelvis with associated enhancement of the peritoneum suggesting carcinomatosis. Redemonstration of sigmoid wall thickening which may represent colitis versus carcinomatosis. Delayed left nephrogram with severe left hydro-ureteronephrosis similar to prior.  Given findings of colitis as well as ongoing symptoms of melena and diarrhea patient underwent EGD as well as flexible sigmoidoscopy which showed Schatzki's ring and diverticulosis but no evidence of tumor. Her symptoms are possibly secondary to carcinomatosis. She also had other stool studies done including C. difficile and  stool cultures which were negative.Patient did undergo a diagnostic tap of her ascites on 07/09/2018 which was consistent with adenocarcinoma  Plan was to do weekly carbotaxol chemotherapy with carboplatin only for the first 3 weeks to assess patient tolerance. Cycle 1 was started on 07/28/2018.    Interval history-she is recovering from urinary tract infection and has completed her course of antibiotics.  Denies any dysuria or burning urination at this time.  Appetite and weight have remained stable.  Denies any tingling numbness in her extremities  ECOG PS- 1 Pain scale- 0  Review of systems- Review of Systems  Constitutional: Positive for malaise/fatigue. Negative for chills, fever and weight loss.  HENT: Negative for congestion, ear discharge and nosebleeds.   Eyes: Negative for blurred vision.  Respiratory: Negative for cough, hemoptysis, sputum production, shortness of breath and wheezing.   Cardiovascular: Negative for chest pain, palpitations, orthopnea and claudication.  Gastrointestinal: Negative for abdominal pain, blood in stool, constipation, diarrhea, heartburn, melena, nausea and vomiting.  Genitourinary: Negative for dysuria, flank pain, frequency, hematuria and urgency.  Musculoskeletal: Negative for back pain, joint pain and myalgias.  Skin: Negative for rash.  Neurological: Negative for dizziness, tingling, focal weakness, seizures, weakness and headaches.  Endo/Heme/Allergies: Does not bruise/bleed easily.  Psychiatric/Behavioral: Negative for depression and suicidal ideas. The patient does not have insomnia.       Allergies  Allergen Reactions  . Penicillins Anaphylaxis    Has patient had a PCN reaction causing immediate rash, facial/tongue/throat swelling, SOB or lightheadedness with hypotension: Yes Has patient had a PCN reaction causing severe rash involving mucus membranes or skin necrosis: No Has patient had a PCN reaction that required hospitalization No  Has patient had a PCN reaction occurring within the last 10 years: No If all of the above answers are "  NO", then may proceed with Cephalosporin use.     Past Medical History:  Diagnosis Date  . CAD (coronary artery disease)   . Chronic kidney disease    urinary incontinence  . Hyperthyroidism   . Hypothyroidism (acquired)   . Ovarian cancer ([place]) 2014  . Shingles   . Third degree AV block ([place]) 10/09/2015     Past Surgical History:  Procedure Laterality Date  . ABDOMINAL HYSTERECTOMY    . BLADDER REPAIR    . CORONARY ANGIOPLASTY WITH STENT PLACEMENT  2001   Dr. Isaiah Blakes in [place]  . CYSTOURETHROSCOPY, WITH INSERTION OF INDWELLING URETERAL STENT (EG,GIBBONS OR DOUBLE-J TYPE) (Left Ureter Left   . PACEMAKER INSERTION N/A 10/11/2015   Procedure: INSERTION PACEMAKER;  Surgeon: [name], MD;  Location: [facility] ORS;  Service: Cardiovascular;  Laterality: N/A;  . PARTIAL HYSTERECTOMY    . PORTA CATH INSERTION N/A 07/24/2018   Procedure: PORTA CATH INSERTION;  Surgeon: Algernon Huxley, MD;  Location: [facility];  Service: Cardiovascular;  Laterality: N/A;    Social History   Socioeconomic History  . Marital status: Widowed    Spouse name: Not on file  . Number of children: Not on file  . Years of education: Not on file  . Highest education level: Not on file  Occupational History  . Not on file  Social Needs  . Financial resource strain: Not on file  . Food insecurity    Worry: Not on file    Inability: Not on file  . Transportation needs    Medical: Not on file    Non-medical: Not on file  Tobacco Use  . Smoking status: Never Smoker  . Smokeless tobacco: Never Used  Substance and Sexual Activity  . Alcohol use: No  . Drug use: No  . Sexual activity: Never  Lifestyle  . Physical activity    Days per week: Not on file    Minutes per session: Not on file  . Stress: Not on file  Relationships  . Social Herbalist on phone: Not on file     Gets together: Not on file    Attends religious service: Not on file    Active member of club or organization: Not on file    Attends meetings of clubs or organizations: Not on file    Relationship status: Not on file  . Intimate partner violence    Fear of current or ex partner: Not on file    Emotionally abused: Not on file    Physically abused: Not on file    Forced sexual activity: Not on file  Other Topics Concern  . 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 Diabetes Brother      Current Outpatient Medications:  .  aspirin EC 81 MG tablet, Take 81 mg by mouth daily., Disp: , Rfl:  .  atorvastatin (LIPITOR) 10 MG tablet, Take 10 mg by mouth at bedtime. , Disp: , Rfl:  .  b complex vitamins tablet, Take 1 tablet by mouth daily., Disp: , Rfl:  .  Bioflavonoid Products (ESTER C PO), Take 500 mg by mouth daily. , Disp: , Rfl:  .  Calcium Carbonate-Vitamin D (CALTRATE 600+D PO), Take 1 tablet by mouth daily., Disp: , Rfl:  .  Cholecalciferol (VITAMIN D3) 1000 units CAPS, Take  1,000 Units by mouth daily. , Disp: , Rfl:  .  Coenzyme Q10 (COQ10) 100 MG CAPS, Take 100 mg by mouth daily., Disp: , Rfl:  .  Digestive Enzymes (DIGESTIVE ENZYME PO), Take 1 Dose by mouth 3 (three) times daily before meals., Disp: , Rfl:  .  Glucosamine-MSM-Hyaluronic Acd (JOINT HEALTH PO), Take 1 tablet by mouth 2 (two) times a day. , Disp: , Rfl:  .  levothyroxine (SYNTHROID) 100 MCG tablet, Take 100 mcg by mouth daily before breakfast. , Disp: , Rfl:  .  mirtazapine (REMERON) 7.5 MG tablet, Take 7.5 mg by mouth at bedtime., Disp: , Rfl:  .  OLANZapine (ZYPREXA) 5 MG tablet, TAKE 1 TABLET(5 MG) BY MOUTH AT BEDTIME, Disp: 30 tablet, Rfl: 0 .  omeprazole (PRILOSEC) 40 MG capsule, Take 40 mg by mouth daily., Disp: , Rfl:  .  ondansetron (ZOFRAN) 4 MG tablet, TAKE 1 TABLET(4 MG) BY MOUTH  EVERY 8 HOURS AS NEEDED FOR NAUSEA OR VOMITING, Disp: 90 tablet, Rfl: 0  Physical exam:  Vitals:   11/28/18 0937  BP: (!) 158/74  Pulse: (!) 108  Temp: 98.4 F (36.9 C)  TempSrc: Tympanic  Weight: 108 lb (49 kg)  Height: 5\' 1"  (1.549 m)   Physical Exam HENT:     Head: Normocephalic and atraumatic.  Eyes:     Pupils: Pupils are equal, round, and reactive to light.  Neck:     Musculoskeletal: Normal range of motion.  Cardiovascular:     Rate and Rhythm: Regular rhythm. Tachycardia present.     Heart sounds: Normal heart sounds.  Pulmonary:     Effort: Pulmonary effort is normal.     Breath sounds: Normal breath sounds.  Abdominal:     General: Bowel sounds are normal.     Palpations: Abdomen is soft.  Skin:    General: Skin is warm and dry.  Neurological:     Mental Status: She is alert and oriented to person, place, and time.      CMP Latest Ref Rng & Units 11/21/2018  Glucose 70 - 99 mg/dL 158(H)  BUN 8 - 23 mg/dL 22  Creatinine 0.44 - 1.00 mg/dL 0.86  Sodium 135 - 145 mmol/L 139  Potassium 3.5 - 5.1 mmol/L 3.9  Chloride 98 - 111 mmol/L 104  CO2 22 - 32 mmol/L 27  Calcium 8.9 - 10.3 mg/dL 9.5  Total Protein 6.5 - 8.1 g/dL 6.7  Total Bilirubin 0.3 - 1.2 mg/dL 0.4  Alkaline Phos 38 - 126 U/L 57  AST 15 - 41 U/L 27  ALT 0 - 44 U/L 19   CBC Latest Ref Rng & Units 11/21/2018  WBC 4.0 - 10.5 K/uL 4.0  Hemoglobin 12.0 - 15.0 g/dL 10.0(L)  Hematocrit 36.0 - 46.0 % 31.0(L)  Platelets 150 - 400 K/uL 292    No images are attached to the encounter.  Ct Chest W Contrast  Result Date: 11/11/2018 CLINICAL DATA:  Restaging ovarian carcinoma. Regional diagnosis 2014. Two thousand nineteen recurrence. EXAM: CT CHEST, ABDOMEN, AND PELVIS WITH CONTRAST TECHNIQUE: Multidetector CT imaging of the chest, abdomen and pelvis was performed following the standard protocol during bolus administration of intravenous contrast. CONTRAST:  98mL OMNIPAQUE IOHEXOL 300 MG/ML  SOLN  COMPARISON:  09/04/2018 and 12/23/2017 FINDINGS: CT CHEST FINDINGS Cardiovascular: A right-sided Port-A-Cath is noted along with a left-sided pacemaker. No complicating features. The heart is within normal limits in size. No pericardial effusion. Moderate mass effect on the right ventricle due to  pectus deformity. The aorta is normal in caliber. No dissection. Stable atherosclerotic calcifications. The branch vessels are patent. Stable coronary artery calcifications. Mediastinum/Nodes: No mediastinal or hilar mass or adenopathy. The esophagus is grossly normal. Lungs/Pleura: No worrisome pulmonary lesions. Stable band of linear scarring type changes in the right middle lobe. No infiltrates or effusions. No pleural lesions. Musculoskeletal: No breast masses, supraclavicular or axillary adenopathy. The thyroid gland appears normal. The bony structures are unremarkable. No worrisome bone lesions. CT ABDOMEN PELVIS FINDINGS Hepatobiliary: No focal hepatic lesions are identified. No definite peritoneal surface lesions involving the liver. No intra or extrahepatic biliary dilatation. The portal and hepatic veins are patent. Pancreas: No mass, inflammation or ductal dilatation. Spleen: Normal size.  No focal lesions. Adrenals/Urinary Tract: The adrenal glands are unremarkable and stable. Stable mild left-sided hydronephrosis with prominent extrarenal pelvis and a double-J ureteral stent remains in unchanged position coiled in the extrarenal pelvis and coursing down the left ureter and into the bladder. No worrisome renal lesions. No renal or ureteral calculi. No bladder lesions or bladder calculi. Stomach/Bowel: The stomach, duodenum, small bowel and colon are grossly normal. No acute inflammatory process, mass lesions or obstructive findings. Moderate to large amount of stool noted throughout the colon. Vascular/Lymphatic: Advanced atherosclerotic calcifications involving the aorta and iliac arteries but no aneurysm.  Branch vessels are patent. The major venous structures are patent. No mesenteric or retroperitoneal mass or adenopathy. I do not see any omental disease. Two small enhancing nodules adjacent to the descending colon could be stable peritoneal nodules or diverticuli. Stable bilobed cystic lesion in the right pelvis measuring approximately 3.4 x 1.8 cm. No other pelvic lesions or obvious peritoneal implants in the pelvis. No pelvic adenopathy. No inguinal adenopathy. Reproductive: Surgically absent. Other: No free abdominal/pelvic fluid collections. Musculoskeletal: No significant bony findings. IMPRESSION: 1. Stable bilobed cystic lesion in the right pelvis. 2. Resolution of ascites. 3. Two small enhancing nodules in the left abdomen adjacent to the ascending colon could be small stable peritoneal nodules or diverticuli. 4. No omental or peritoneal surface lesions are identified elsewhere. 5. No findings for metastatic disease involving the chest. 6. Stable advanced atherosclerotic vascular calcifications in the chest, abdomen and pelvis. 7. Stable left-sided double-J ureteral stent with minimal residual left-sided hydronephrosis. Electronically Signed   By: Marijo Sanes M.D.   On: 11/11/2018 13:12   Ct Abdomen Pelvis W Contrast  Result Date: 11/11/2018 CLINICAL DATA:  Restaging ovarian carcinoma. Regional diagnosis 2014. Two thousand nineteen recurrence. EXAM: CT CHEST, ABDOMEN, AND PELVIS WITH CONTRAST TECHNIQUE: Multidetector CT imaging of the chest, abdomen and pelvis was performed following the standard protocol during bolus administration of intravenous contrast. CONTRAST:  36mL OMNIPAQUE IOHEXOL 300 MG/ML  SOLN COMPARISON:  09/04/2018 and 12/23/2017 FINDINGS: CT CHEST FINDINGS Cardiovascular: A right-sided Port-A-Cath is noted along with a left-sided pacemaker. No complicating features. The heart is within normal limits in size. No pericardial effusion. Moderate mass effect on the right ventricle due to  pectus deformity. The aorta is normal in caliber. No dissection. Stable atherosclerotic calcifications. The branch vessels are patent. Stable coronary artery calcifications. Mediastinum/Nodes: No mediastinal or hilar mass or adenopathy. The esophagus is grossly normal. Lungs/Pleura: No worrisome pulmonary lesions. Stable band of linear scarring type changes in the right middle lobe. No infiltrates or effusions. No pleural lesions. Musculoskeletal: No breast masses, supraclavicular or axillary adenopathy. The thyroid gland appears normal. The bony structures are unremarkable. No worrisome bone lesions. CT ABDOMEN PELVIS FINDINGS Hepatobiliary: No focal hepatic  lesions are identified. No definite peritoneal surface lesions involving the liver. No intra or extrahepatic biliary dilatation. The portal and hepatic veins are patent. Pancreas: No mass, inflammation or ductal dilatation. Spleen: Normal size.  No focal lesions. Adrenals/Urinary Tract: The adrenal glands are unremarkable and stable. Stable mild left-sided hydronephrosis with prominent extrarenal pelvis and a double-J ureteral stent remains in unchanged position coiled in the extrarenal pelvis and coursing down the left ureter and into the bladder. No worrisome renal lesions. No renal or ureteral calculi. No bladder lesions or bladder calculi. Stomach/Bowel: The stomach, duodenum, small bowel and colon are grossly normal. No acute inflammatory process, mass lesions or obstructive findings. Moderate to large amount of stool noted throughout the colon. Vascular/Lymphatic: Advanced atherosclerotic calcifications involving the aorta and iliac arteries but no aneurysm. Branch vessels are patent. The major venous structures are patent. No mesenteric or retroperitoneal mass or adenopathy. I do not see any omental disease. Two small enhancing nodules adjacent to the descending colon could be stable peritoneal nodules or diverticuli. Stable bilobed cystic lesion in the  right pelvis measuring approximately 3.4 x 1.8 cm. No other pelvic lesions or obvious peritoneal implants in the pelvis. No pelvic adenopathy. No inguinal adenopathy. Reproductive: Surgically absent. Other: No free abdominal/pelvic fluid collections. Musculoskeletal: No significant bony findings. IMPRESSION: 1. Stable bilobed cystic lesion in the right pelvis. 2. Resolution of ascites. 3. Two small enhancing nodules in the left abdomen adjacent to the ascending colon could be small stable peritoneal nodules or diverticuli. 4. No omental or peritoneal surface lesions are identified elsewhere. 5. No findings for metastatic disease involving the chest. 6. Stable advanced atherosclerotic vascular calcifications in the chest, abdomen and pelvis. 7. Stable left-sided double-J ureteral stent with minimal residual left-sided hydronephrosis. Electronically Signed   By: Marijo Sanes M.D.   On: 11/11/2018 13:12     Assessment and plan- Patient is a 85 y.o. female  with recurrent ovarian carcinoma.  She is here for on treatment assessment prior to cycle 815 of weekly carbotaxol chemotherapy  Counts okay to proceed with cycle 15 of weekly carbotaxol chemotherapy today.  She will directly proceed for cycle 16 next week and I will see her back in 2 weeks for cycle 17.  She continues to tolerate chemotherapy very well with minimal side effects.  Ca1 25 has normalized is 14.  Plan is to complete 18 cycles followed by repeat scans  Patient has mild normocytic anemia but hemoglobin is remained stable around 10.  Continue to monitor.   Patient is fatigued and mildly tachycardic today.  Likely recovering from her ongoing urinary tract infection.  Continue to monitor    Visit Diagnosis 1. Malignant neoplasm of ovary, unspecified laterality ([place])   2. Encounter for antineoplastic chemotherapy      Dr. Randa Evens, MD, MPH [facility] [id number] 11/28/2018 10:32 AM

## 2018-12-04 ENCOUNTER — Other Ambulatory Visit: Payer: Self-pay

## 2018-12-04 DIAGNOSIS — C569 Malignant neoplasm of unspecified ovary: Secondary | ICD-10-CM

## 2018-12-05 ENCOUNTER — Other Ambulatory Visit: Payer: Self-pay

## 2018-12-05 ENCOUNTER — Inpatient Hospital Stay: Payer: Medicare HMO

## 2018-12-05 VITALS — BP 140/84 | HR 80 | Temp 98.4°F | Resp 18 | Wt 109.0 lb

## 2018-12-05 DIAGNOSIS — Z87440 Personal history of urinary (tract) infections: Secondary | ICD-10-CM | POA: Diagnosis not present

## 2018-12-05 DIAGNOSIS — Z95828 Presence of other vascular implants and grafts: Secondary | ICD-10-CM

## 2018-12-05 DIAGNOSIS — C569 Malignant neoplasm of unspecified ovary: Secondary | ICD-10-CM | POA: Diagnosis not present

## 2018-12-05 DIAGNOSIS — Z79899 Other long term (current) drug therapy: Secondary | ICD-10-CM | POA: Diagnosis not present

## 2018-12-05 DIAGNOSIS — N189 Chronic kidney disease, unspecified: Secondary | ICD-10-CM | POA: Diagnosis not present

## 2018-12-05 DIAGNOSIS — Z5111 Encounter for antineoplastic chemotherapy: Secondary | ICD-10-CM | POA: Diagnosis not present

## 2018-12-05 DIAGNOSIS — D649 Anemia, unspecified: Secondary | ICD-10-CM | POA: Diagnosis not present

## 2018-12-05 DIAGNOSIS — E039 Hypothyroidism, unspecified: Secondary | ICD-10-CM | POA: Diagnosis not present

## 2018-12-05 LAB — COMPREHENSIVE METABOLIC PANEL
ALT: 20 U/L (ref 0–44)
AST: 25 U/L (ref 15–41)
Albumin: 3.4 g/dL — ABNORMAL LOW (ref 3.5–5.0)
Alkaline Phosphatase: 56 U/L (ref 38–126)
Anion gap: 7 (ref 5–15)
BUN: 24 mg/dL — ABNORMAL HIGH (ref 8–23)
CO2: 29 mmol/L (ref 22–32)
Calcium: 9.6 mg/dL (ref 8.9–10.3)
Chloride: 104 mmol/L (ref 98–111)
Creatinine, Ser: 0.83 mg/dL (ref 0.44–1.00)
GFR calc Af Amer: >60 mL/min (ref >60)
GFR calc non Af Amer: >60 mL/min (ref >60)
Glucose, Bld: 115 mg/dL — ABNORMAL HIGH (ref 70–99)
Potassium: 4.2 mmol/L (ref 3.5–5.1)
Sodium: 140 mmol/L (ref 135–145)
Total Bilirubin: 0.6 mg/dL (ref 0.3–1.2)
Total Protein: 6.8 g/dL (ref 6.5–8.1)

## 2018-12-05 LAB — CBC WITH DIFFERENTIAL/PLATELET
Abs Immature Granulocytes: 0.02 10*3/uL (ref 0.00–0.07)
Basophils Absolute: 0 10*3/uL (ref 0.0–0.1)
Basophils Relative: 1 %
Eosinophils Absolute: 0.1 10*3/uL (ref 0.0–0.5)
Eosinophils Relative: 2 %
HCT: 30.8 % — ABNORMAL LOW (ref 36.0–46.0)
Hemoglobin: 10 g/dL — ABNORMAL LOW (ref 12.0–15.0)
Immature Granulocytes: 0 %
Lymphocytes Relative: 33 %
Lymphs Abs: 1.5 10*3/uL (ref 0.7–4.0)
MCH: 31.3 pg (ref 26.0–34.0)
MCHC: 32.5 g/dL (ref 30.0–36.0)
MCV: 96.6 fL (ref 80.0–100.0)
Monocytes Absolute: 0.3 10*3/uL (ref 0.1–1.0)
Monocytes Relative: 6 %
Neutro Abs: 2.6 10*3/uL (ref 1.7–7.7)
Neutrophils Relative %: 58 %
Platelets: 267 10*3/uL (ref 150–400)
RBC: 3.19 MIL/uL — ABNORMAL LOW (ref 3.87–5.11)
RDW: 14.2 % (ref 11.5–15.5)
WBC: 4.5 10*3/uL (ref 4.0–10.5)
nRBC: 0 % (ref 0.0–0.2)

## 2018-12-05 MED ORDER — FAMOTIDINE IN NACL 20-0.9 MG/50ML-% IV SOLN
20.0000 mg | Freq: Once | INTRAVENOUS | Status: AC
  Administered 2018-12-05: 20 mg via INTRAVENOUS
  Filled 2018-12-05: qty 50

## 2018-12-05 MED ORDER — DEXAMETHASONE SODIUM PHOSPHATE 10 MG/ML IJ SOLN
10.0000 mg | Freq: Once | INTRAMUSCULAR | Status: AC
  Administered 2018-12-05: 10 mg via INTRAVENOUS
  Filled 2018-12-05: qty 1

## 2018-12-05 MED ORDER — PALONOSETRON HCL INJECTION 0.25 MG/5ML
0.2500 mg | Freq: Once | INTRAVENOUS | Status: AC
  Administered 2018-12-05: 0.25 mg via INTRAVENOUS
  Filled 2018-12-05: qty 5

## 2018-12-05 MED ORDER — HEPARIN SOD (PORK) LOCK FLUSH 100 UNIT/ML IV SOLN
500.0000 [IU] | Freq: Once | INTRAVENOUS | Status: AC | PRN
  Administered 2018-12-05: 500 [IU]
  Filled 2018-12-05: qty 5

## 2018-12-05 MED ORDER — SODIUM CHLORIDE 0.9 % IV SOLN
60.0000 mg/m2 | Freq: Once | INTRAVENOUS | Status: AC
  Administered 2018-12-05: 84 mg via INTRAVENOUS
  Filled 2018-12-05: qty 14

## 2018-12-05 MED ORDER — SODIUM CHLORIDE 0.9 % IV SOLN
Freq: Once | INTRAVENOUS | Status: AC
  Administered 2018-12-05: 11:00:00 via INTRAVENOUS
  Filled 2018-12-05: qty 250

## 2018-12-05 MED ORDER — SODIUM CHLORIDE 0.9 % IV SOLN
84.1500 mg | Freq: Once | INTRAVENOUS | Status: AC
  Administered 2018-12-05: 80 mg via INTRAVENOUS
  Filled 2018-12-05: qty 8

## 2018-12-05 MED ORDER — SODIUM CHLORIDE 0.9% FLUSH
10.0000 mL | Freq: Once | INTRAVENOUS | Status: AC
  Administered 2018-12-05: 10:00:00 10 mL via INTRAVENOUS
  Filled 2018-12-05: qty 10

## 2018-12-08 DIAGNOSIS — I700 Atherosclerosis of aorta: Secondary | ICD-10-CM | POA: Diagnosis not present

## 2018-12-08 DIAGNOSIS — C561 Malignant neoplasm of right ovary: Secondary | ICD-10-CM | POA: Diagnosis not present

## 2018-12-08 DIAGNOSIS — Z0000 Encounter for general adult medical examination without abnormal findings: Secondary | ICD-10-CM | POA: Diagnosis not present

## 2018-12-08 DIAGNOSIS — T451X5A Adverse effect of antineoplastic and immunosuppressive drugs, initial encounter: Secondary | ICD-10-CM | POA: Diagnosis not present

## 2018-12-08 DIAGNOSIS — G620 Drug-induced polyneuropathy: Secondary | ICD-10-CM | POA: Diagnosis not present

## 2018-12-08 DIAGNOSIS — E782 Mixed hyperlipidemia: Secondary | ICD-10-CM | POA: Diagnosis not present

## 2018-12-08 DIAGNOSIS — R531 Weakness: Secondary | ICD-10-CM | POA: Diagnosis not present

## 2018-12-08 DIAGNOSIS — E039 Hypothyroidism, unspecified: Secondary | ICD-10-CM | POA: Diagnosis not present

## 2018-12-11 ENCOUNTER — Other Ambulatory Visit: Payer: Self-pay

## 2018-12-11 DIAGNOSIS — C569 Malignant neoplasm of unspecified ovary: Secondary | ICD-10-CM

## 2018-12-12 ENCOUNTER — Other Ambulatory Visit: Payer: Self-pay

## 2018-12-12 ENCOUNTER — Inpatient Hospital Stay: Payer: Medicare HMO

## 2018-12-12 ENCOUNTER — Inpatient Hospital Stay (HOSPITAL_BASED_OUTPATIENT_CLINIC_OR_DEPARTMENT_OTHER): Payer: Medicare HMO | Admitting: Oncology

## 2018-12-12 ENCOUNTER — Encounter: Payer: Self-pay | Admitting: Oncology

## 2018-12-12 VITALS — BP 122/59 | HR 98 | Temp 98.1°F | Ht 61.0 in | Wt 110.0 lb

## 2018-12-12 DIAGNOSIS — C569 Malignant neoplasm of unspecified ovary: Secondary | ICD-10-CM

## 2018-12-12 DIAGNOSIS — Z87440 Personal history of urinary (tract) infections: Secondary | ICD-10-CM | POA: Diagnosis not present

## 2018-12-12 DIAGNOSIS — Z95828 Presence of other vascular implants and grafts: Secondary | ICD-10-CM

## 2018-12-12 DIAGNOSIS — D701 Agranulocytosis secondary to cancer chemotherapy: Secondary | ICD-10-CM

## 2018-12-12 DIAGNOSIS — Z79899 Other long term (current) drug therapy: Secondary | ICD-10-CM | POA: Diagnosis not present

## 2018-12-12 DIAGNOSIS — T451X5A Adverse effect of antineoplastic and immunosuppressive drugs, initial encounter: Secondary | ICD-10-CM | POA: Diagnosis not present

## 2018-12-12 DIAGNOSIS — Z5111 Encounter for antineoplastic chemotherapy: Secondary | ICD-10-CM | POA: Diagnosis not present

## 2018-12-12 DIAGNOSIS — N189 Chronic kidney disease, unspecified: Secondary | ICD-10-CM | POA: Diagnosis not present

## 2018-12-12 DIAGNOSIS — E039 Hypothyroidism, unspecified: Secondary | ICD-10-CM | POA: Diagnosis not present

## 2018-12-12 DIAGNOSIS — D649 Anemia, unspecified: Secondary | ICD-10-CM | POA: Diagnosis not present

## 2018-12-12 LAB — COMPREHENSIVE METABOLIC PANEL
ALT: 18 U/L (ref 0–44)
AST: 29 U/L (ref 15–41)
Albumin: 3.6 g/dL (ref 3.5–5.0)
Alkaline Phosphatase: 58 U/L (ref 38–126)
Anion gap: 10 (ref 5–15)
BUN: 20 mg/dL (ref 8–23)
CO2: 26 mmol/L (ref 22–32)
Calcium: 9 mg/dL (ref 8.9–10.3)
Chloride: 103 mmol/L (ref 98–111)
Creatinine, Ser: 0.86 mg/dL (ref 0.44–1.00)
GFR calc Af Amer: >60 mL/min (ref >60)
GFR calc non Af Amer: 59 mL/min — ABNORMAL LOW (ref >60)
Glucose, Bld: 165 mg/dL — ABNORMAL HIGH (ref 70–99)
Potassium: 4.1 mmol/L (ref 3.5–5.1)
Sodium: 139 mmol/L (ref 135–145)
Total Bilirubin: 0.6 mg/dL (ref 0.3–1.2)
Total Protein: 7.1 g/dL (ref 6.5–8.1)

## 2018-12-12 LAB — CBC WITH DIFFERENTIAL/PLATELET
Abs Immature Granulocytes: 0.02 10*3/uL (ref 0.00–0.07)
Basophils Absolute: 0 10*3/uL (ref 0.0–0.1)
Basophils Relative: 1 %
Eosinophils Absolute: 0.1 10*3/uL (ref 0.0–0.5)
Eosinophils Relative: 2 %
HCT: 32.3 % — ABNORMAL LOW (ref 36.0–46.0)
Hemoglobin: 10.4 g/dL — ABNORMAL LOW (ref 12.0–15.0)
Immature Granulocytes: 1 %
Lymphocytes Relative: 37 %
Lymphs Abs: 1.1 10*3/uL (ref 0.7–4.0)
MCH: 31.3 pg (ref 26.0–34.0)
MCHC: 32.2 g/dL (ref 30.0–36.0)
MCV: 97.3 fL (ref 80.0–100.0)
Monocytes Absolute: 0.2 10*3/uL (ref 0.1–1.0)
Monocytes Relative: 6 %
Neutro Abs: 1.6 10*3/uL — ABNORMAL LOW (ref 1.7–7.7)
Neutrophils Relative %: 53 %
Platelets: 276 10*3/uL (ref 150–400)
RBC: 3.32 MIL/uL — ABNORMAL LOW (ref 3.87–5.11)
RDW: 14.1 % (ref 11.5–15.5)
WBC: 3 10*3/uL — ABNORMAL LOW (ref 4.0–10.5)
nRBC: 0 % (ref 0.0–0.2)

## 2018-12-12 MED ORDER — SODIUM CHLORIDE 0.9% FLUSH
10.0000 mL | Freq: Once | INTRAVENOUS | Status: AC
  Administered 2018-12-12: 09:00:00 10 mL via INTRAVENOUS
  Filled 2018-12-12: qty 10

## 2018-12-12 NOTE — Progress Notes (Signed)
Patient stated that she is not feeling well. Patient stated that she feels woozy, weak and with chills. Patient denied having a fever, vomiting or nausea.

## 2018-12-13 LAB — CA 125: Cancer Antigen (CA) 125: 13.6 U/mL (ref 0.0–38.1)

## 2018-12-14 ENCOUNTER — Telehealth: Payer: Self-pay | Admitting: *Deleted

## 2018-12-14 MED ORDER — HEPARIN SOD (PORK) LOCK FLUSH 100 UNIT/ML IV SOLN
500.0000 [IU] | Freq: Once | INTRAVENOUS | Status: AC
  Administered 2018-12-12: 500 [IU] via INTRAVENOUS

## 2018-12-14 NOTE — Progress Notes (Signed)
Hematology/Oncology Consult note [facility]  Telephone:([phone] [phone]  Patient Care Team: Rusty Aus, MD as PCP - General (Internal Medicine) Clent Jacks, RN as Registered Nurse   Name of the patient: Natalie Stone  [id number]  22-Oct-1926   Date of visit: 12/14/18  Diagnosis- recurrent ovarian cancer  Chief complaint/ Reason for visit-routine follow-up of ovarian cancer  Heme/Onc history: Patient is a 85 year old female with a past medical history significant for stage III ovarian cancer that was treated in [place] and 2013. She underwent neoadjuvant chemotherapy followed by surgery with Dr. Beverly Milch followed by adjuvant chemotherapy. Genetic testing was negative. She then established care at [facility] in 2017. More recently patient was found to have a consistently increasing Ca1 25 level from 42.6 in December 20 18-781 in February 2020. Most recent Ca1 25 was 02/21/2005 on 07/06/2018. Scans were however negative for any recurrence. Patient had CT abdomen and pelvis with contrast done on 07/06/2018 which showed severe left hydro ureteral nephrosis. Sigmoid wall thickening differential includes carcinomatosis and colitis. She did had a repeat scan on 07/09/2018 which showed interval increase in ascites mostly within the pelvis with associated enhancement of the peritoneum suggesting carcinomatosis. Redemonstration of sigmoid wall thickening which may represent colitis versus carcinomatosis. Delayed left nephrogram with severe left hydro-ureteronephrosis similar to prior.  Given findings of colitis as well as ongoing symptoms of melena and diarrhea patient underwent EGD as well as flexible sigmoidoscopy which showed Schatzki's ring and diverticulosis but no evidence of tumor. Her symptoms are possibly secondary to carcinomatosis. She also had other stool studies done including C. difficile and stool cultures which were negative.Patient  did undergo a diagnostic tap of her ascites on 07/09/2018 which was consistent with adenocarcinoma  Patient started cycle 1 with single agent carboplatin on 07/28/2018 and finished 17 cycles of weekly carbotaxol on 11/28/2018  Interval history-patient initially took a week of ciprofloxacin for her UTI.  But she continued to feel fatigued and had ongoing urinary tract symptoms.  She was seen by Dr. Sabra Heck who repeated her UA and was still found to be positive.  Antibiotic was changed to nitrofurantoin.  She currently reports feeling fatigued.  Denies any fever.  ECOG PS- 1 Pain scale- 0  Review of systems- Review of Systems  Constitutional: Positive for malaise/fatigue. Negative for chills, fever and weight loss.  HENT: Negative for congestion, ear discharge and nosebleeds.   Eyes: Negative for blurred vision.  Respiratory: Negative for cough, hemoptysis, sputum production, shortness of breath and wheezing.   Cardiovascular: Negative for chest pain, palpitations, orthopnea and claudication.  Gastrointestinal: Negative for abdominal pain, blood in stool, constipation, diarrhea, heartburn, melena, nausea and vomiting.  Genitourinary: Negative for dysuria, flank pain, frequency, hematuria and urgency.  Musculoskeletal: Negative for back pain, joint pain and myalgias.  Skin: Negative for rash.  Neurological: Negative for dizziness, tingling, focal weakness, seizures, weakness and headaches.  Endo/Heme/Allergies: Does not bruise/bleed easily.  Psychiatric/Behavioral: Negative for depression and suicidal ideas. The patient does not have insomnia.       Allergies  Allergen Reactions   Penicillins Anaphylaxis    Has patient had a PCN reaction causing immediate rash, facial/tongue/throat swelling, SOB or lightheadedness with hypotension: Yes Has patient had a PCN reaction causing severe rash involving mucus membranes or skin necrosis: No Has patient had a PCN reaction that required hospitalization  No Has patient had a PCN reaction occurring within the last 10 years: No If all of  the above answers are "NO", then may proceed with Cephalosporin use.     Past Medical History:  Diagnosis Date   CAD (coronary artery disease)    Chronic kidney disease    urinary incontinence   Hyperthyroidism    Hypothyroidism (acquired)    Ovarian cancer ([place]) 2014   Shingles    Third degree AV block ([place]) 10/09/2015     Past Surgical History:  Procedure Laterality Date   ABDOMINAL HYSTERECTOMY     BLADDER REPAIR     CORONARY ANGIOPLASTY WITH STENT PLACEMENT  2001   Dr. Isaiah Blakes in [place]   CYSTOURETHROSCOPY, WITH INSERTION OF INDWELLING URETERAL STENT (EG,GIBBONS OR DOUBLE-J TYPE) (Left Ureter Left    PACEMAKER INSERTION N/A 10/11/2015   Procedure: INSERTION PACEMAKER;  Surgeon: [name], MD;  Location: [facility] ORS;  Service: Cardiovascular;  Laterality: N/A;   PARTIAL HYSTERECTOMY     PORTA CATH INSERTION N/A 07/24/2018   Procedure: PORTA CATH INSERTION;  Surgeon: Algernon Huxley, MD;  Location: [facility];  Service: Cardiovascular;  Laterality: N/A;    Social History   Socioeconomic History   Marital status: Widowed    Spouse name: Not on file   Number of children: Not on file   Years of education: Not on file   Highest education level: Not on file  Occupational History   Not on file  Social Needs   Financial resource strain: Not on file   Food insecurity    Worry: Not on file    Inability: Not on file   Transportation needs    Medical: Not on file    Non-medical: Not on file  Tobacco Use   Smoking status: Never Smoker   Smokeless tobacco: Never Used  Substance and Sexual Activity   Alcohol use: No   Drug use: No   Sexual activity: Never  Lifestyle   Physical activity    Days per week: Not on file    Minutes per session: Not on file   Stress: Not on file  Relationships   Social connections    Talks on phone: Not on file     Gets together: Not on file    Attends religious service: Not on file    Active member of club or organization: Not on file    Attends meetings of clubs or organizations: Not on file    Relationship status: Not on file   Intimate partner violence    Fear of current or ex partner: Not on file    Emotionally abused: Not on file    Physically abused: Not on file    Forced sexual activity: Not on file  Other Topics Concern   Not on file  Social History Narrative   moved from [place] in July 2017; 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 , Disp: , Rfl:    b complex vitamins tablet, Take 1 tablet by mouth daily., Disp: , Rfl:    Bioflavonoid Products (ESTER C PO), Take 500 mg by mouth daily. , Disp: , Rfl:    Calcium Carbonate-Vitamin D (CALTRATE 600+D PO), Take 1 tablet by mouth daily., Disp: , Rfl:    Cholecalciferol (VITAMIN D3)  1000 units CAPS, Take 1,000 Units by mouth daily. , Disp: , Rfl:    ciprofloxacin (CIPRO) 250 MG tablet, Take 1 tablet by mouth daily., Disp: , Rfl:    Coenzyme Q10 (COQ10) 100 MG CAPS, Take 100 mg by mouth daily., Disp: , Rfl:    dexamethasone (DECADRON) 4 MG tablet, Take 1 tablet by mouth daily., Disp: , Rfl:    Digestive Enzymes (DIGESTIVE ENZYME PO), Take 1 Dose by mouth 3 (three) times daily before meals., Disp: , Rfl:    Glucosamine-MSM-Hyaluronic Acd (JOINT HEALTH PO), Take 1 tablet by mouth 2 (two) times a day. , Disp: , Rfl:    levothyroxine (SYNTHROID) 100 MCG tablet, Take 100 mcg by mouth daily before breakfast. , Disp: , Rfl:    mirtazapine (REMERON) 7.5 MG tablet, Take 7.5 mg by mouth at bedtime., Disp: , Rfl:    nitrofurantoin, macrocrystal-monohydrate, (MACROBID) 100 MG capsule, Take 2  capsules by mouth 2 (two) times daily., Disp: , Rfl:    OLANZapine (ZYPREXA) 5 MG tablet, TAKE 1 TABLET(5 MG) BY MOUTH AT BEDTIME, Disp: 30 tablet, Rfl: 0   omeprazole (PRILOSEC) 40 MG capsule, Take 40 mg by mouth daily., Disp: , Rfl:    ondansetron (ZOFRAN) 4 MG tablet, TAKE 1 TABLET(4 MG) BY MOUTH EVERY 8 HOURS AS NEEDED FOR NAUSEA OR VOMITING, Disp: 90 tablet, Rfl: 0  Physical exam:  Vitals:   12/12/18 0943  BP: (!) 122/59  Pulse: 98  Temp: 98.1 F (36.7 C)  TempSrc: Tympanic  Weight: 110 lb (49.9 kg)  Height: 5\' 1"  (1.549 m)   Physical Exam Constitutional:      General: She is not in acute distress. HENT:     Head: Normocephalic and atraumatic.  Eyes:     Pupils: Pupils are equal, round, and reactive to light.  Neck:     Musculoskeletal: Normal range of motion.  Cardiovascular:     Rate and Rhythm: Normal rate and regular rhythm.     Heart sounds: Normal heart sounds.  Pulmonary:     Effort: Pulmonary effort is normal.     Breath sounds: Normal breath sounds.  Abdominal:     General: Bowel sounds are normal.     Palpations: Abdomen is soft.  Skin:    General: Skin is warm and dry.  Neurological:     Mental Status: She is alert and oriented to person, place, and time.      CMP Latest Ref Rng & Units 12/12/2018  Glucose 70 - 99 mg/dL 165(H)  BUN 8 - 23 mg/dL 20  Creatinine 0.44 - 1.00 mg/dL 0.86  Sodium 135 - 145 mmol/L 139  Potassium 3.5 - 5.1 mmol/L 4.1  Chloride 98 - 111 mmol/L 103  CO2 22 - 32 mmol/L 26  Calcium 8.9 - 10.3 mg/dL 9.0  Total Protein 6.5 - 8.1 g/dL 7.1  Total Bilirubin 0.3 - 1.2 mg/dL 0.6  Alkaline Phos 38 - 126 U/L 58  AST 15 - 41 U/L 29  ALT 0 - 44 U/L 18   CBC Latest Ref Rng & Units 12/12/2018  WBC 4.0 - 10.5 K/uL 3.0(L)  Hemoglobin 12.0 - 15.0 g/dL 10.4(L)  Hematocrit 36.0 - 46.0 % 32.3(L)  Platelets 150 - 400 K/uL 276     Assessment and plan- Patient is a 85 y.o. female with recurrent ovarian carcinoma here for routine  follow-up  I have looked back at her many chemo cycle she has received.  Patient received single agent carboplatin for the  first 3 cycles and subsequently went on to receive carbotaxol and has completed a total of 17 treatments so far.  She did have CT scans in September 2020 which showed resolution of ascites.  Small enhancing nodules 2 of them in the left abdomen which were stable peritoneal nodules versus diverticuli.  No omental or peritoneal surface lesions were identified on smear and there is no evidence of distant metastatic disease.  Also her CA-125 normalized to 13.6.    Patient is getting more fatigued from chemotherapy.  Although her hemoglobin is holding up around 10 she is more leukopenic today with a white count of 3 and an ANC of 1.6.  I would therefore like to hold off on giving her the 18th treatment today.  I discussed with patient and her daughter that it would be okay to stop at 17 cycle and continue surveillance scans every 6 months along with monitoring of her Ca1 25.  Patient and her daughter verbalized understanding.  She does have GYN oncology follow-up in about 2 weeks time.  I will see her back in 3 months with CBC with differential, CMP and CA-125 and she will need CT chest abdomen pelvis with contrast a week prior   Visit Diagnosis 1. Malignant neoplasm of ovary, unspecified laterality ([place])   2. Chemotherapy induced neutropenia ([facility])      Dr. Randa Evens, MD, MPH [facility] at [facility] [id number] 12/14/2018 12:37 PM

## 2018-12-14 NOTE — Telephone Encounter (Signed)
I had spoke to pt when she was in clinic and while I was discontinuing her port. I told her that Dr. Janese Banks said to see her back in 23months and have ct scan 1 week for the visit with dr Janese Banks and have labs. I told her that we will mail her the appts. She was locked out of my chart and I gave her the pamphlet that had the telephone number to have her daughter [name] to call and get it back to working so they could see the appts and be able to send my chart messages. I have put in the mail her appts for ct and lab and see md in jan 2021.

## 2018-12-17 ENCOUNTER — Ambulatory Visit: Payer: Medicare HMO

## 2018-12-22 ENCOUNTER — Other Ambulatory Visit: Payer: Self-pay | Admitting: Nurse Practitioner

## 2018-12-24 ENCOUNTER — Other Ambulatory Visit: Payer: Self-pay

## 2018-12-24 ENCOUNTER — Inpatient Hospital Stay: Payer: Medicare HMO

## 2018-12-29 DIAGNOSIS — N135 Crossing vessel and stricture of ureter without hydronephrosis: Secondary | ICD-10-CM | POA: Diagnosis not present

## 2019-01-01 DIAGNOSIS — R300 Dysuria: Secondary | ICD-10-CM | POA: Diagnosis not present

## 2019-01-06 DIAGNOSIS — M818 Other osteoporosis without current pathological fracture: Secondary | ICD-10-CM | POA: Diagnosis not present

## 2019-01-06 DIAGNOSIS — R112 Nausea with vomiting, unspecified: Secondary | ICD-10-CM | POA: Diagnosis not present

## 2019-01-06 DIAGNOSIS — I2510 Atherosclerotic heart disease of native coronary artery without angina pectoris: Secondary | ICD-10-CM | POA: Diagnosis not present

## 2019-01-06 DIAGNOSIS — T451X5A Adverse effect of antineoplastic and immunosuppressive drugs, initial encounter: Secondary | ICD-10-CM | POA: Diagnosis not present

## 2019-01-06 DIAGNOSIS — Z950 Presence of cardiac pacemaker: Secondary | ICD-10-CM | POA: Diagnosis not present

## 2019-01-06 DIAGNOSIS — N135 Crossing vessel and stricture of ureter without hydronephrosis: Secondary | ICD-10-CM | POA: Diagnosis not present

## 2019-01-06 DIAGNOSIS — E039 Hypothyroidism, unspecified: Secondary | ICD-10-CM | POA: Diagnosis not present

## 2019-01-06 DIAGNOSIS — Z01818 Encounter for other preprocedural examination: Secondary | ICD-10-CM | POA: Diagnosis not present

## 2019-01-06 DIAGNOSIS — D72819 Decreased white blood cell count, unspecified: Secondary | ICD-10-CM | POA: Diagnosis not present

## 2019-01-06 DIAGNOSIS — N1330 Unspecified hydronephrosis: Secondary | ICD-10-CM | POA: Diagnosis not present

## 2019-01-08 DIAGNOSIS — M818 Other osteoporosis without current pathological fracture: Secondary | ICD-10-CM | POA: Diagnosis not present

## 2019-01-08 DIAGNOSIS — E039 Hypothyroidism, unspecified: Secondary | ICD-10-CM | POA: Diagnosis not present

## 2019-01-08 DIAGNOSIS — D649 Anemia, unspecified: Secondary | ICD-10-CM | POA: Diagnosis not present

## 2019-01-08 DIAGNOSIS — I700 Atherosclerosis of aorta: Secondary | ICD-10-CM | POA: Diagnosis not present

## 2019-01-08 DIAGNOSIS — Z8543 Personal history of malignant neoplasm of ovary: Secondary | ICD-10-CM | POA: Diagnosis not present

## 2019-01-08 DIAGNOSIS — E782 Mixed hyperlipidemia: Secondary | ICD-10-CM | POA: Diagnosis not present

## 2019-01-08 DIAGNOSIS — I2510 Atherosclerotic heart disease of native coronary artery without angina pectoris: Secondary | ICD-10-CM | POA: Diagnosis not present

## 2019-01-08 DIAGNOSIS — G620 Drug-induced polyneuropathy: Secondary | ICD-10-CM | POA: Diagnosis not present

## 2019-01-08 DIAGNOSIS — Z90710 Acquired absence of both cervix and uterus: Secondary | ICD-10-CM | POA: Diagnosis not present

## 2019-01-08 DIAGNOSIS — Z950 Presence of cardiac pacemaker: Secondary | ICD-10-CM | POA: Diagnosis not present

## 2019-01-08 DIAGNOSIS — N131 Hydronephrosis with ureteral stricture, not elsewhere classified: Secondary | ICD-10-CM | POA: Diagnosis not present

## 2019-01-14 ENCOUNTER — Inpatient Hospital Stay: Payer: Medicare HMO | Attending: Obstetrics and Gynecology | Admitting: Obstetrics and Gynecology

## 2019-01-14 ENCOUNTER — Other Ambulatory Visit: Payer: Self-pay

## 2019-01-14 ENCOUNTER — Inpatient Hospital Stay: Payer: Medicare HMO

## 2019-01-14 ENCOUNTER — Encounter: Payer: Self-pay | Admitting: Obstetrics and Gynecology

## 2019-01-14 VITALS — BP 157/69 | HR 111 | Temp 98.3°F | Resp 16 | Wt 106.2 lb

## 2019-01-14 DIAGNOSIS — C569 Malignant neoplasm of unspecified ovary: Secondary | ICD-10-CM

## 2019-01-14 DIAGNOSIS — Z95828 Presence of other vascular implants and grafts: Secondary | ICD-10-CM

## 2019-01-14 DIAGNOSIS — Z9221 Personal history of antineoplastic chemotherapy: Secondary | ICD-10-CM | POA: Insufficient documentation

## 2019-01-14 LAB — CBC WITH DIFFERENTIAL/PLATELET
Abs Immature Granulocytes: 0.02 10*3/uL (ref 0.00–0.07)
Basophils Absolute: 0 10*3/uL (ref 0.0–0.1)
Basophils Relative: 1 %
Eosinophils Absolute: 0.2 10*3/uL (ref 0.0–0.5)
Eosinophils Relative: 3 %
HCT: 36.4 % (ref 36.0–46.0)
Hemoglobin: 11.3 g/dL — ABNORMAL LOW (ref 12.0–15.0)
Immature Granulocytes: 0 %
Lymphocytes Relative: 27 %
Lymphs Abs: 2 10*3/uL (ref 0.7–4.0)
MCH: 29.9 pg (ref 26.0–34.0)
MCHC: 31 g/dL (ref 30.0–36.0)
MCV: 96.3 fL (ref 80.0–100.0)
Monocytes Absolute: 0.8 10*3/uL (ref 0.1–1.0)
Monocytes Relative: 11 %
Neutro Abs: 4.4 10*3/uL (ref 1.7–7.7)
Neutrophils Relative %: 58 %
Platelets: 212 10*3/uL (ref 150–400)
RBC: 3.78 MIL/uL — ABNORMAL LOW (ref 3.87–5.11)
RDW: 13.9 % (ref 11.5–15.5)
WBC: 7.4 10*3/uL (ref 4.0–10.5)
nRBC: 0 % (ref 0.0–0.2)

## 2019-01-14 LAB — COMPREHENSIVE METABOLIC PANEL
ALT: 24 U/L (ref 0–44)
AST: 29 U/L (ref 15–41)
Albumin: 3.6 g/dL (ref 3.5–5.0)
Alkaline Phosphatase: 67 U/L (ref 38–126)
Anion gap: 9 (ref 5–15)
BUN: 28 mg/dL — ABNORMAL HIGH (ref 8–23)
CO2: 29 mmol/L (ref 22–32)
Calcium: 9.6 mg/dL (ref 8.9–10.3)
Chloride: 102 mmol/L (ref 98–111)
Creatinine, Ser: 0.82 mg/dL (ref 0.44–1.00)
GFR calc Af Amer: >60 mL/min (ref >60)
GFR calc non Af Amer: >60 mL/min (ref >60)
Glucose, Bld: 122 mg/dL — ABNORMAL HIGH (ref 70–99)
Potassium: 4.2 mmol/L (ref 3.5–5.1)
Sodium: 140 mmol/L (ref 135–145)
Total Bilirubin: 0.5 mg/dL (ref 0.3–1.2)
Total Protein: 7.2 g/dL (ref 6.5–8.1)

## 2019-01-14 MED ORDER — HEPARIN SOD (PORK) LOCK FLUSH 100 UNIT/ML IV SOLN
500.0000 [IU] | Freq: Once | INTRAVENOUS | Status: AC
  Administered 2019-01-14: 500 [IU] via INTRAVENOUS

## 2019-01-14 MED ORDER — SODIUM CHLORIDE 0.9% FLUSH
10.0000 mL | Freq: Once | INTRAVENOUS | Status: AC
  Administered 2019-01-14: 15:00:00 10 mL via INTRAVENOUS
  Filled 2019-01-14: qty 10

## 2019-01-14 NOTE — Progress Notes (Signed)
Gynecologic Oncology Interval Visit   Referring Provider: Dr. Janese Banks  Chief Concern: recurrent high grade ovarian cancer   Subjective:  Natalie Stone is a 85 y.o. female with advanced ovarian cancer treated in 2013 and recurrence diagnosed 5/20 now s/p 6 cycles of chemotherapy with normalization of CA125, who returns to clinic today for reevaluation.  She initiated weekly carbo AUC 2 on 07/28/2018 for recurrent disease.  For week 4, Taxol 60 mg/m was added.  She completed Day 8, Cycle 6 on 12/05/2018.   CA 125 has been followed in interm:  07/28/2018 1,066 08/04/2018 1,326.0 08/11/2018 1066 08/25/2018 623 09/19/2018 271 09/15/2018 86 09/29/2018 37.1 10/13/2018 20.8 10/30/2018 17.4 11/14/2018 14.7 11/21/2018 14.0 12/12/2018 13.6  CT scan 11/11/18 IMPRESSION: 1. Stable bilobed cystic lesion in the right pelvis. 2. Resolution of ascites. 3. Two small enhancing nodules in the left abdomen adjacent to the ascending colon could be small stable peritoneal nodules or diverticuli. 4. No omental or peritoneal surface lesions are identified elsewhere. 5. No findings for metastatic disease involving the chest. 6. Stable advanced atherosclerotic vascular calcifications in the chest, abdomen and pelvis. 7. Stable left-sided double-J ureteral stent with minimal residual left-sided hydronephrosis.  She continues to have some fatigue and weakness since chemotherapy. D15, Cycle 6 was held due to her symptoms. She had some anemia and leukopenia. She discussed surveillance imaging and monitoring of her CA 125 with Dr. Janese Banks.   Left ureteral stent changed by Dr Kandee Keen 01/08/19 and retrograde showed persistent stricture of distal ureter.  Plan to change again in 4-5 months.   Recent UTI treated with antibiotics.    She has significant fatigue and mild anemia, neutropenia with chemo.   Oncology History Ovarian cancer- stage III per patient [no records available]. Status post neoadjuvant chemotherapy followed by  surgery with Dr Eppie Gibson in [place] in 2013; followed by adjuvant chemotherapy. Clinically no concerns for recurrence at this time.  She said she had negative genetic testing.  Establish care at [facility] in 2017 since she moved back to this area to be near family.  She is grandmother of Dr. Benjaman Kindler.  CA125 3/18 =13.7  02/05/2017- CT C/A/P IMPRESSION: 1. 14 x 13 mm low-density oval mass in the right retrocrural location at the level of the aortic hiatus. This may reflect a mildly enlarged retrocrural lymph node. This could potentially reflect metastatic adenopathy. It could reflect a small lymphangiocele or chronic reactive lymph node. If there are any previous CT exams available for comparison, these could help exclude metastatic disease. If not, follow-up chest CT in 2-3 months to reassess for stability would be recommended. 2. No other findings to suggest metastatic disease. No evidence of locally recurrent ovarian carcinoma. Status post hysterectomy. 3. No acute findings. 4. Coronary artery calcifications.  Aortic atherosclerosis. 5. Mild increased colonic stool burden.  CA125 1/19 = 56.5  05/01/2017- CT C/A/P IMPRESSION: -Status post hysterectomy and suspected bilateral salpingo-oophorectomy. - No findings specific for recurrent or metastatic disease.  - Benign cystic lesion in the right retrocrural space is likely related to the thoracic duct. This is not considered suspicious for a lymph node. - 4 mm subpleural node in the posterior right middle lobe, new, nonspecific. This appearance is favored to be infectious/inflammatory. However, consider follow-up CT chest in 6 months.  CA125 5/19 = 174.9   06/27/17 CT C/A/P IMPRESSION: 1. There is a small amount of complex fluid within the pelvis with peripheral peritoneal enhancement. Findings are indeterminate but concerning for the possibility of  recurrent disease. 2. Additionally within the pelvis there is a small hyperdense nodule  which likely represents a hyperdense diverticulum as opposed to a peritoneal nodule as no definite additional peritoneal nodules are identified.  Recommend attention on follow-up.  3. Slight interval decrease in size of right middle lobe nodule, likely resolving infectious/inflammatory process. Recommend attention on follow-up.  Reviewed the May 2019 CT scan with radiology and there is nothing that is amenable to biopsy.   CA-125 6/19-  210.2  Mild SUI.  She had bladder repair many years ago for SUI after having children.  Has been treated with anticholenergics in past, but mouth was very dry and had problems talking so stopped the medication.  Now uses Mirabegron 50 mg qd (a B-agonist) for urge incontinence per Dr Buel Ream suggestion.  No symptoms since she started this medication.   Since 10/18 CA125 has risen progressively from 33 to >1300.  She has had multiple negative scans of the chest/abd/pelvis.  In 5/20 she was admitted to [facility] and diagnosed with pelvic recurrence of her cancer and decision made to treat with Carboplain/Taxol.     Last screening colonoscopy 2013.    Component     Latest Ref Rng & Units 12/19/2016 01/22/2017 03/21/2017 06/19/2017  Cancer Antigen (CA) 125     0.0 - 38.1 U/mL 33.3 42.6 (H) 56.5 (H) 174.9 (H)   Component     Latest Ref Rng & Units 08/05/2017 09/30/2017 11/27/2017 02/03/2018  Cancer Antigen (CA) 125     0.0 - 38.1 U/mL 210.2 (H) 297.0 (H) 334.0 (H) 865.0 (H)    In 5/19 the In vitae 83 gene panel was negative for pathogenic mutations. Had one VUS in the MET gene.   CT chest 10/30/17 1. No findings suspicious for metastatic disease in the chest. Solitary tiny 2 mm right middle lobe pulmonary nodule is again slightly decreased and probably benign. 2. Coronary atherosclerosis.  CT abd/pelvis 11/19 1. No acute abdominal/pelvic findings. 2. No abdominal/pelvic lymphadenopathy and no worrisome omental or peritoneal lesions are identified. No findings  suspicious for recurrent tumor. 3. Advanced atherosclerotic disease involving the thoracic and abdominal aorta and branch vessels.  Seen by Dr. Fransisca Connors on 02/05/2018.  NED and asymptomatic at that time.  Normal CT scan and normal MRI of brain.  She presented to [facility] ER with complaints of abdominal pain, fatigue, nausea, poor appetite, and weight loss. She was found to have melena and admitted for concerns of recurrence of ovarian cancer. CA 125 was 1307 and CT demonstrated carcinomatosis and soft tissue pelvic mass, and hydronephrosis of left kidney. Biopsy was discussed but felt not to be feasible. Paracentesis was performed on 5/20 and cytology consistent with adenocarcinoma. She underwent left ureteral stent placement on 5/20. Given melena and diarrhea she also underwent flex sig and EGD which noted mild colonic edema, diverticulosis, and Schatski ring but no evidence of tumor burden or acute pathology. C diff, fecal leukocytes, stool O&P, stool culture, fecal calprotectin, and h pylori were negative. Ultimately melena and diarrhea felt to be secondary to carcinomatosis. After extensive discussion of goals of care with patient and family, she elected to reinitiate chemotherapy.    Problem List: Patient Active Problem List   Diagnosis Date Noted  . Chemotherapy induced neutropenia ([place]) 09/01/2018  . Goals of care, counseling/discussion 07/21/2018  . CINV (chemotherapy-induced nausea and vomiting) 07/08/2018  . Diarrhea 07/06/2018  . Hydronephrosis of left kidney 07/06/2018  . Combined hyperlipidemia 02/07/2017  . Adult idiopathic generalized osteoporosis 11/05/2016  .  Medicare annual wellness visit, initial 11/05/2016  . Malignant neoplasm of ovary ([place]) 11/23/2015  . Acquired hypothyroidism 10/26/2015  . Aortic atherosclerosis ([place]) 10/26/2015  . Cardiac pacemaker 10/26/2015  . Chemotherapy-induced neuropathy ([place]) 10/26/2015  . Coronary artery disease involving native coronary artery of  native heart without angina pectoris 10/26/2015  . Pacemaker pocket hematoma 10/26/2015  . Status post placement of cardiac pacemaker 10/26/2015  . Bradycardia 10/09/2015  . Third degree AV block ([place]) 10/09/2015    Past Medical History: Past Medical History:  Diagnosis Date  . CAD (coronary artery disease)   . Chronic kidney disease    urinary incontinence  . Hyperthyroidism   . Hypothyroidism (acquired)   . Ovarian cancer ([place]) 2014  . Shingles   . Third degree AV block ([place]) 10/09/2015    Past Surgical History: Past Surgical History:  Procedure Laterality Date  . ABDOMINAL HYSTERECTOMY    . BLADDER REPAIR    . CORONARY ANGIOPLASTY WITH STENT PLACEMENT  2001   Dr. Isaiah Blakes in [place]  . CYSTOURETHROSCOPY, WITH INSERTION OF INDWELLING URETERAL STENT (EG,GIBBONS OR DOUBLE-J TYPE) (Left Ureter Left   . PACEMAKER INSERTION N/A 10/11/2015   Procedure: INSERTION PACEMAKER;  Surgeon: [name], MD;  Location: [facility] ORS;  Service: Cardiovascular;  Laterality: N/A;  . PARTIAL HYSTERECTOMY    . PORTA CATH INSERTION N/A 07/24/2018   Procedure: PORTA CATH INSERTION;  Surgeon: Algernon Huxley, MD;  Location: [place] [facility];  Service: Cardiovascular;  Laterality: N/A;     Family History: Family History  Problem Relation Age of Onset  . Diabetes Brother     Social History: Social History   Socioeconomic History  . Marital status: Widowed    Spouse name: Not on file  . Number of children: Not on file  . Years of education: Not on file  . Highest education level: Not on file  Occupational History  . Not on file  Social Needs  . Financial resource strain: Not on file  . Food insecurity    Worry: Not on file    Inability: Not on file  . Transportation needs    Medical: Not on file    Non-medical: Not on file  Tobacco Use  . Smoking status: Never Smoker  . Smokeless tobacco: Never Used  Substance and Sexual Activity  . Alcohol use: No  . Drug use: No  . Sexual  activity: Never  Lifestyle  . Physical activity    Days per week: Not on file    Minutes per session: Not on file  . Stress: Not on file  Relationships  . Social Herbalist on phone: Not on file    Gets together: Not on file    Attends religious service: Not on file    Active member of club or organization: Not on file    Attends meetings of clubs or organizations: Not on file    Relationship status: Not on file  . Intimate partner violence    Fear of current or ex partner: Not on file    Emotionally abused: Not on file    Physically abused: Not on file    Forced sexual activity: Not on file  Other Topics Concern  . 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 Penicillins Anaphylaxis    Has patient had a PCN reaction causing immediate  rash, facial/tongue/throat swelling, SOB or lightheadedness with hypotension: Yes Has patient had a PCN reaction causing severe rash involving mucus membranes or skin necrosis: No Has patient had a PCN reaction that required hospitalization No Has patient had a PCN reaction occurring within the last 10 years: No If all of the above answers are "NO", then may proceed with Cephalosporin use.    Current Medications: Current Outpatient Medications  Medication Sig Dispense Refill  . aspirin EC 81 MG tablet Take 81 mg by mouth daily.    Marland Kitchen atorvastatin (LIPITOR) 10 MG tablet Take 10 mg by mouth at bedtime.     Marland Kitchen b complex vitamins tablet Take 1 tablet by mouth daily.    Marland Kitchen Bioflavonoid Products (ESTER C PO) Take 500 mg by mouth daily.     . Calcium Carbonate-Vitamin D (CALTRATE 600+D PO) Take 1 tablet by mouth daily.    . Cholecalciferol (VITAMIN D3) 1000 units CAPS Take 1,000 Units by mouth daily.     . Coenzyme Q10 (COQ10) 100 MG CAPS Take 100 mg by mouth daily.    . Digestive Enzymes (DIGESTIVE ENZYME PO) Take 1 Dose by  mouth 3 (three) times daily before meals.    . Glucosamine-MSM-Hyaluronic Acd (JOINT HEALTH PO) Take 1 tablet by mouth 2 (two) times a day.     . levothyroxine (SYNTHROID) 100 MCG tablet Take 100 mcg by mouth daily before breakfast.     . mirtazapine (REMERON) 7.5 MG tablet Take 7.5 mg by mouth at bedtime.    . ciprofloxacin (CIPRO) 250 MG tablet Take 1 tablet by mouth daily.    Marland Kitchen dexamethasone (DECADRON) 4 MG tablet Take 1 tablet by mouth daily.    . hydrocortisone 2.5 % cream Apply topically.    . nitrofurantoin, macrocrystal-monohydrate, (MACROBID) 100 MG capsule Take by mouth.    . OLANZapine (ZYPREXA) 5 MG tablet TAKE 1 TABLET(5 MG) BY MOUTH AT BEDTIME (Patient not taking: Reported on 01/14/2019) 30 tablet 0  . omeprazole (PRILOSEC) 40 MG capsule Take 40 mg by mouth daily.    . ondansetron (ZOFRAN) 4 MG tablet TAKE 1 TABLET(4 MG) BY MOUTH EVERY 8 HOURS AS NEEDED FOR NAUSEA OR VOMITING (Patient not taking: Reported on 01/14/2019) 90 tablet 0  . tamsulosin (FLOMAX) 0.4 MG CAPS capsule     . trospium (SANCTURA) 20 MG tablet Take by mouth.     No current facility-administered medications for this visit.    Review of Systems General: fatigue, weakness, appetite reduced  Skin: no complaints Eyes: no complaints HEENT: no complaints Breasts: no complaints Pulmonary: no complaints Cardiac: no complaints Gastrointestinal: no complaints Genitourinary/Sexual: problems with bladder function Ob/Gyn: no complaints Musculoskeletal: no complaints Hematology: no complaints Neurologic/Psych: no complaints  Objective:  Physical Examination:  BP (!) 157/69 (BP Location: Right Arm, Patient Position: Sitting)   Pulse (!) 111   Temp 98.3 F (36.8 C) (Tympanic)   Resp 16   Wt 106 lb 3.2 oz (48.2 kg)   SpO2 100%   BMI 20.07 kg/m    ECOG Performance Status: 2 - Symptomatic, <50% confined to bed  GENERAL: Patient is a well appearing female in no acute distress. Thin built. Accompanied by her  daughter.  HEENT:  Sclera clear. Anicteric. Alopecia.  NODES:  Negative axillary, supraclavicular, inguinal lymph node survery LUNGS:  Clear to auscultation bilaterally.   HEART:  Regular rate and rhythm.  ABDOMEN:  Soft, nontender.  No hernias, incisions well healed. No masses or ascites EXTREMITIES:  No peripheral edema.  Atraumatic. No cyanosis SKIN:  Clear with no obvious rashes or skin changes.  NEURO:  Nonfocal. Well oriented.  Appropriate affect.  Pelvic: EGBUS/Vagina atrophic, BImanual/RV: no masses.      Assessment:  Natalie Stone is a 85 y.o. female diagnosed with advanced ovarian cancer treated with neoadjuvant chemo and interval debulking in 2013 in [place].  Germline genetic testing normal.    Now with recurrent peritoneal disease documented after CA-125 had been progressively rising for over a year with normal CT scans. Ureteral stent placed due to hydronephrosis. Started weekly carbo/taxol chemotherapy 6/20 and CA125 came down from 1300 to 13 most recently.  Now finished with six cycles and plan for CT scan in January.  She has significant fatigue.   Left ureteral stent changed by Dr Kandee Keen 01/08/19 and retrograde showed persistent stricture of distal ureter.  Plan to change again in 4-5 months.   She has had some occasional UTIs.  May want to consider suppression if she has another episode.   Urge incontinence s/p surgery for SUI in the past.  Now uses Mirabegron 50 mg qd (a B-agonist) for urge incontinence per Dr Buel Ream suggestion with good results and no symptoms.   Plan:   Problem List Items Addressed This Visit      Endocrine   Malignant neoplasm of ovary ([place]) - Primary   Relevant Medications   nitrofurantoin, macrocrystal-monohydrate, (MACROBID) 100 MG capsule     She will have CT scan in January to assess response.  We will see her back for follow up in 3 months and Dr Janese Banks will continue to follow her as well post chemotherapy.  Patient concerned about mild  anemia and fatigue, but this is mild and we assured her that it should improve now that she is done with treatment.  She also had some elevated blood sugars and this should also improve now that she is not receiving weekly decadron with chemo.    She will follow up with Dr Kandee Keen in 4-5 months about her left ureteral stent.  Will see if January CT scan sheds any light on cause of left hydroureter.  She is in remission, but stricture may be due to residual scar tissue.  Told her that she may have to live with the stent because we are not planning surgery related to treatment of her ovarian cancer.   She has negative germline genetic testing, but somatic testing could be done in the future to see if she is a candidate for a PARP inhibitor, but would have to get tumor from original surgery.    The patient's diagnosis, an outline of the further diagnostic and laboratory studies which will be required, the recommendation, and alternatives were discussed.  All questions were answered to the patient's satisfaction.  Mellody Drown, MD  CC:  Rusty Aus, MD [facility] [place],  [place] [address] [phone]

## 2019-01-14 NOTE — Progress Notes (Signed)
Patient here to follow up with gyn onc. Denies any vaginal discharge, bleeding or pain. Patient has flomax and trospium in medication list but does not recall picking them up.

## 2019-01-15 LAB — CA 125: Cancer Antigen (CA) 125: 15 U/mL (ref 0.0–38.1)

## 2019-01-29 ENCOUNTER — Telehealth: Payer: Self-pay | Admitting: Oncology

## 2019-01-29 NOTE — Telephone Encounter (Signed)
Patient's daughter called requesting patient has become progressively weak for the past 1 to 2 days.  Poor oral intake.  No fever, nausea, vomiting.  Also reports seeing flashing lights, which per daughter has been a chronic intermittent issue for her.  Also patient reports headache this morning.  Daughter wonders if this is secondary to chemotherapy side effects. Chart reviewed.  Patient was last seen by Dr. Janese Banks on 01/12/2019.  Last chemotherapy was on 12/05/2018  Discussed with daughter that chemotherapy may cause chronic fatigue however patient appears to have an acute decline of her condition.  Advised encouraged oral intake.  Recommend patient go to emergency room given concern of dehydration.  Daughter says that she will call office in the morning if patient has no improvement.

## 2019-01-30 ENCOUNTER — Inpatient Hospital Stay (HOSPITAL_BASED_OUTPATIENT_CLINIC_OR_DEPARTMENT_OTHER): Payer: Medicare HMO | Admitting: Oncology

## 2019-01-30 ENCOUNTER — Other Ambulatory Visit: Payer: Self-pay

## 2019-01-30 ENCOUNTER — Inpatient Hospital Stay: Payer: Medicare HMO

## 2019-01-30 ENCOUNTER — Telehealth: Payer: Self-pay | Admitting: *Deleted

## 2019-01-30 ENCOUNTER — Other Ambulatory Visit: Payer: Self-pay | Admitting: *Deleted

## 2019-01-30 ENCOUNTER — Inpatient Hospital Stay: Payer: Medicare HMO | Attending: Oncology | Admitting: *Deleted

## 2019-01-30 VITALS — HR 98

## 2019-01-30 VITALS — BP 120/52 | HR 118 | Temp 98.7°F | Resp 16 | Wt 102.7 lb

## 2019-01-30 DIAGNOSIS — N10 Acute tubulo-interstitial nephritis: Secondary | ICD-10-CM

## 2019-01-30 DIAGNOSIS — K529 Noninfective gastroenteritis and colitis, unspecified: Secondary | ICD-10-CM | POA: Insufficient documentation

## 2019-01-30 DIAGNOSIS — C569 Malignant neoplasm of unspecified ovary: Secondary | ICD-10-CM | POA: Diagnosis not present

## 2019-01-30 DIAGNOSIS — N3000 Acute cystitis without hematuria: Secondary | ICD-10-CM | POA: Diagnosis not present

## 2019-01-30 DIAGNOSIS — Z880 Allergy status to penicillin: Secondary | ICD-10-CM | POA: Insufficient documentation

## 2019-01-30 DIAGNOSIS — Z79899 Other long term (current) drug therapy: Secondary | ICD-10-CM | POA: Diagnosis not present

## 2019-01-30 DIAGNOSIS — Z87440 Personal history of urinary (tract) infections: Secondary | ICD-10-CM | POA: Diagnosis not present

## 2019-01-30 DIAGNOSIS — R5383 Other fatigue: Secondary | ICD-10-CM | POA: Diagnosis not present

## 2019-01-30 DIAGNOSIS — R531 Weakness: Secondary | ICD-10-CM

## 2019-01-30 DIAGNOSIS — R000 Tachycardia, unspecified: Secondary | ICD-10-CM | POA: Insufficient documentation

## 2019-01-30 DIAGNOSIS — R509 Fever, unspecified: Secondary | ICD-10-CM | POA: Diagnosis not present

## 2019-01-30 DIAGNOSIS — N390 Urinary tract infection, site not specified: Secondary | ICD-10-CM

## 2019-01-30 DIAGNOSIS — E860 Dehydration: Secondary | ICD-10-CM | POA: Insufficient documentation

## 2019-01-30 DIAGNOSIS — Z95828 Presence of other vascular implants and grafts: Secondary | ICD-10-CM

## 2019-01-30 DIAGNOSIS — K222 Esophageal obstruction: Secondary | ICD-10-CM | POA: Diagnosis not present

## 2019-01-30 DIAGNOSIS — Z833 Family history of diabetes mellitus: Secondary | ICD-10-CM | POA: Insufficient documentation

## 2019-01-30 DIAGNOSIS — R188 Other ascites: Secondary | ICD-10-CM | POA: Diagnosis not present

## 2019-01-30 LAB — COMPREHENSIVE METABOLIC PANEL
ALT: 17 U/L (ref 0–44)
AST: 25 U/L (ref 15–41)
Albumin: 3.4 g/dL — ABNORMAL LOW (ref 3.5–5.0)
Alkaline Phosphatase: 54 U/L (ref 38–126)
Anion gap: 10 (ref 5–15)
BUN: 33 mg/dL — ABNORMAL HIGH (ref 8–23)
CO2: 25 mmol/L (ref 22–32)
Calcium: 9.2 mg/dL (ref 8.9–10.3)
Chloride: 103 mmol/L (ref 98–111)
Creatinine, Ser: 0.95 mg/dL (ref 0.44–1.00)
GFR calc Af Amer: >60 mL/min (ref >60)
GFR calc non Af Amer: 53 mL/min — ABNORMAL LOW (ref >60)
Glucose, Bld: 142 mg/dL — ABNORMAL HIGH (ref 70–99)
Potassium: 3.8 mmol/L (ref 3.5–5.1)
Sodium: 138 mmol/L (ref 135–145)
Total Bilirubin: 1.2 mg/dL (ref 0.3–1.2)
Total Protein: 7.2 g/dL (ref 6.5–8.1)

## 2019-01-30 LAB — CBC WITH DIFFERENTIAL/PLATELET
Abs Immature Granulocytes: 0.05 10*3/uL (ref 0.00–0.07)
Basophils Absolute: 0 10*3/uL (ref 0.0–0.1)
Basophils Relative: 0 %
Eosinophils Absolute: 0 10*3/uL (ref 0.0–0.5)
Eosinophils Relative: 0 %
HCT: 37.2 % (ref 36.0–46.0)
Hemoglobin: 11.7 g/dL — ABNORMAL LOW (ref 12.0–15.0)
Immature Granulocytes: 0 %
Lymphocytes Relative: 12 %
Lymphs Abs: 1.7 10*3/uL (ref 0.7–4.0)
MCH: 29.8 pg (ref 26.0–34.0)
MCHC: 31.5 g/dL (ref 30.0–36.0)
MCV: 94.9 fL (ref 80.0–100.0)
Monocytes Absolute: 1.3 10*3/uL — ABNORMAL HIGH (ref 0.1–1.0)
Monocytes Relative: 9 %
Neutro Abs: 11.1 10*3/uL — ABNORMAL HIGH (ref 1.7–7.7)
Neutrophils Relative %: 79 %
Platelets: 201 10*3/uL (ref 150–400)
RBC: 3.92 MIL/uL (ref 3.87–5.11)
RDW: 13.2 % (ref 11.5–15.5)
WBC: 14.2 10*3/uL — ABNORMAL HIGH (ref 4.0–10.5)
nRBC: 0 % (ref 0.0–0.2)

## 2019-01-30 LAB — URINALYSIS, COMPLETE (UACMP) WITH MICROSCOPIC
Bilirubin Urine: NEGATIVE
Glucose, UA: 50 mg/dL — AB
Ketones, ur: NEGATIVE mg/dL
Nitrite: NEGATIVE
Protein, ur: 100 mg/dL — AB
RBC / HPF: >50 RBC/hpf — ABNORMAL HIGH (ref 0–5)
Specific Gravity, Urine: 1.018 (ref 1.005–1.030)
Squamous Epithelial / HPF: NONE SEEN (ref 0–5)
WBC, UA: >50 WBC/hpf — ABNORMAL HIGH (ref 0–5)
pH: 5 (ref 5.0–8.0)

## 2019-01-30 MED ORDER — CIPROFLOXACIN IN D5W 200 MG/100ML IV SOLN
200.0000 mg | Freq: Two times a day (BID) | INTRAVENOUS | Status: DC
  Administered 2019-01-30: 13:00:00 200 mg via INTRAVENOUS
  Filled 2019-01-30: qty 100

## 2019-01-30 MED ORDER — HEPARIN SOD (PORK) LOCK FLUSH 100 UNIT/ML IV SOLN
500.0000 [IU] | Freq: Once | INTRAVENOUS | Status: AC
  Administered 2019-01-30: 500 [IU] via INTRAVENOUS
  Filled 2019-01-30: qty 5

## 2019-01-30 MED ORDER — CIPROFLOXACIN HCL 250 MG PO TABS: 250.0000 mg | ORAL_TABLET | Freq: Two times a day (BID) | ORAL | 0 refills | Status: DC

## 2019-01-30 MED ORDER — CIPROFLOXACIN IN D5W 200 MG/100ML IV SOLN
200.0000 mg | Freq: Once | INTRAVENOUS | Status: DC
  Filled 2019-01-30: qty 100

## 2019-01-30 MED ORDER — SODIUM CHLORIDE 0.9% FLUSH
10.0000 mL | INTRAVENOUS | Status: DC | PRN
  Administered 2019-01-30: 14:00:00 10 mL via INTRAVENOUS
  Filled 2019-01-30: qty 10

## 2019-01-30 MED ORDER — SODIUM CHLORIDE 0.9% FLUSH
10.0000 mL | Freq: Once | INTRAVENOUS | Status: AC
  Administered 2019-01-30: 10 mL via INTRAVENOUS
  Filled 2019-01-30: qty 10

## 2019-01-30 MED ORDER — SODIUM CHLORIDE 0.9 % IV SOLN
Freq: Once | INTRAVENOUS | Status: AC
  Administered 2019-01-30: 13:00:00 1000 mL via INTRAVENOUS
  Filled 2019-01-30: qty 250

## 2019-01-30 NOTE — Telephone Encounter (Signed)
Daughter called and requests that we check a urine on this patient this morning as she has UTI symptoms. Please advise

## 2019-01-30 NOTE — Progress Notes (Signed)
Pt had HA last nightfelt like blinking lights-like burning behind her eyes, she had spell of dizzy. She ran fever 101.8 last evening then 1 hour later 8:30   100.8.  She did not eat and drink very much and layed in bed most of the day

## 2019-01-30 NOTE — Telephone Encounter (Signed)
Pt agreeable to coming in for labs and see md and poss. fluids

## 2019-01-30 NOTE — Telephone Encounter (Signed)
Ok will do.

## 2019-01-30 NOTE — Telephone Encounter (Signed)
Can you call her and see if she wants to come in for IV fluids?

## 2019-01-31 LAB — URINE CULTURE: Culture: <10000 — AB

## 2019-02-02 ENCOUNTER — Encounter: Payer: Self-pay | Admitting: Oncology

## 2019-02-02 NOTE — Progress Notes (Signed)
Hematology/Oncology Consult note [facility]  [phone] [phone]  Patient Care Team: Rusty Aus, MD as PCP - General (Internal Medicine) Clent Jacks, RN as Registered Nurse   Name of the patient: Natalie Stone  [id number]  07/26/1926   Date of visit: 02/02/19  Diagnosis- recurrent ovarian cancer  Chief complaint/ Reason for visit-acute visit for fatigue, poor oral intake and fever  Heme/Onc history: Patient is a 85 year old female with a past medical history significant for stage III ovarian cancer that was treated in [place] and 2013. She underwent neoadjuvant chemotherapy followed by surgery with Dr. Beverly Milch followed by adjuvant chemotherapy. Genetic testing was negative. She then established care at [facility] in 2017. More recently patient was found to have a consistently increasing Ca1 25 level from 42.6 in December 20 18-781 in February 2020. Most recent Ca1 25 was 02/21/2005 on 07/06/2018. Scans were however negative for any recurrence. Patient had CT abdomen and pelvis with contrast done on 07/06/2018 which showed severe left hydro ureteral nephrosis. Sigmoid wall thickening differential includes carcinomatosis and colitis. She did had a repeat scan on 07/09/2018 which showed interval increase in ascites mostly within the pelvis with associated enhancement of the peritoneum suggesting carcinomatosis. Redemonstration of sigmoid wall thickening which may represent colitis versus carcinomatosis. Delayed left nephrogram with severe left hydro-ureteronephrosis similar to prior.  Given findings of colitis as well as ongoing symptoms of melena and diarrhea patient underwent EGD as well as flexible sigmoidoscopy which showed Schatzki's ring and diverticulosis but no evidence of tumor. Her symptoms are possibly secondary to carcinomatosis. She also had other stool studies done including C. difficile and stool cultures which were  negative.Patient did undergo a diagnostic tap of her ascites on 07/09/2018 which was consistent with adenocarcinoma  Patient started cycle 1 with single agent carboplatin on 07/28/2018 and finished 18 cycles of weekly carbotaxol on 11/28/2018   Interval history-patient reports feeling lightheaded.  She had a fever of 101.8 last evening.  She has not been eating and drinking very much over the last day or 2.  She has spent a lot of time in bed.  She has had recurrent UTIs in the recent past.  Denies any specific burning sensation in her urine at this time.  ECOG PS- 1 Pain scale- 0   Review of systems- Review of Systems  Constitutional: Positive for malaise/fatigue. Negative for chills, fever and weight loss.  HENT: Negative for congestion, ear discharge and nosebleeds.   Eyes: Negative for blurred vision.  Respiratory: Negative for cough, hemoptysis, sputum production, shortness of breath and wheezing.   Cardiovascular: Negative for chest pain, palpitations, orthopnea and claudication.  Gastrointestinal: Negative for abdominal pain, blood in stool, constipation, diarrhea, heartburn, melena, nausea and vomiting.  Genitourinary: Negative for dysuria, flank pain, frequency, hematuria and urgency.  Musculoskeletal: Negative for back pain, joint pain and myalgias.  Skin: Negative for rash.  Neurological: Positive for dizziness. Negative for tingling, focal weakness, seizures, weakness and headaches.  Endo/Heme/Allergies: Does not bruise/bleed easily.  Psychiatric/Behavioral: Negative for depression and suicidal ideas. The patient does not have insomnia.       Allergies  Allergen Reactions  . Penicillins Anaphylaxis    Has patient had a PCN reaction causing immediate rash, facial/tongue/throat swelling, SOB or lightheadedness with hypotension: Yes Has patient had a PCN reaction causing severe rash involving mucus membranes or skin necrosis: No Has patient had a PCN reaction that required  hospitalization No Has patient had a  PCN reaction occurring within the last 10 years: No If all of the above answers are "NO", then may proceed with Cephalosporin use.     Past Medical History:  Diagnosis Date  . CAD (coronary artery disease)   . Chronic kidney disease    urinary incontinence  . Hyperthyroidism   . Hypothyroidism (acquired)   . Ovarian cancer ([place]) 2014  . Shingles   . Third degree AV block ([place]) 10/09/2015     Past Surgical History:  Procedure Laterality Date  . ABDOMINAL HYSTERECTOMY    . BLADDER REPAIR    . CORONARY ANGIOPLASTY WITH STENT PLACEMENT  2001   Dr. Isaiah Blakes in [place]  . CYSTOURETHROSCOPY, WITH INSERTION OF INDWELLING URETERAL STENT (EG,GIBBONS OR DOUBLE-J TYPE) (Left Ureter Left   . PACEMAKER INSERTION N/A 10/11/2015   Procedure: INSERTION PACEMAKER;  Surgeon: [name], MD;  Location: [facility] ORS;  Service: Cardiovascular;  Laterality: N/A;  . PARTIAL HYSTERECTOMY    . PORTA CATH INSERTION N/A 07/24/2018   Procedure: PORTA CATH INSERTION;  Surgeon: Algernon Huxley, MD;  Location: [facility];  Service: Cardiovascular;  Laterality: N/A;    Social History   Socioeconomic History  . Marital status: Widowed    Spouse name: Not on file  . Number of children: Not on file  . Years of education: Not on file  . Highest education level: Not on file  Occupational History  . Not on file  Tobacco Use  . Smoking status: Never Smoker  . Smokeless tobacco: Never Used  Substance and Sexual Activity  . Alcohol use: No  . Drug use: No  . Sexual activity: Never  Other Topics Concern  . 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 Difficulty of Paying Living Expenses: Not on file  Food Insecurity:   . Worried About Charity fundraiser in the Last Year: Not on file  . Ran Out of Food in the Last Year: Not  on file  Transportation Needs:   . Lack of Transportation (Medical): Not on file  . Lack of Transportation (Non-Medical): Not on file  Physical Activity:   . Days of Exercise per Week: Not on file  . Minutes of Exercise per Session: Not on file  Stress:   . Feeling of Stress : Not on file  Social Connections:   . Frequency of Communication with Friends and Family: Not on file  . Frequency of Social Gatherings with Friends and Family: Not on file  . Attends Religious Services: Not on file  . Active Member of Clubs or Organizations: Not on file  . Attends Archivist Meetings: Not on file  . Marital Status: Not on file  Intimate Partner Violence:   . Fear of Current or Ex-Partner: Not on file  . Emotionally Abused: Not on file  . Physically Abused: Not on file  . Sexually Abused: Not on file    Family History  Problem Relation Age of Onset  . Diabetes Brother      Current Outpatient Medications:  .  aspirin EC 81 MG tablet, Take 81 mg by mouth daily., Disp: , Rfl:  .  atorvastatin (LIPITOR) 10 MG tablet, Take 10 mg by mouth at bedtime. , Disp: , Rfl:  .  Bioflavonoid Products (ESTER C PO), Take 500 mg by mouth daily. , Disp: ,  Rfl:  .  Calcium Carbonate-Vitamin D (CALTRATE 600+D PO), Take 1 tablet by mouth daily., Disp: , Rfl:  .  Cholecalciferol (VITAMIN D3) 1000 units CAPS, Take 1,000 Units by mouth daily. , Disp: , Rfl:  .  Coenzyme Q10 (COQ10) 100 MG CAPS, Take 100 mg by mouth daily., Disp: , Rfl:  .  Digestive Enzymes (DIGESTIVE ENZYME PO), Take 1 Dose by mouth 3 (three) times daily before meals., Disp: , Rfl:  .  Glucosamine-MSM-Hyaluronic Acd (JOINT HEALTH PO), Take 1 tablet by mouth 2 (two) times a day. , Disp: , Rfl:  .  hydrocortisone 2.5 % cream, Apply 1 application topically daily as needed. , Disp: , Rfl:  .  levothyroxine (SYNTHROID) 100 MCG tablet, Take 100 mcg by mouth daily before breakfast. , Disp: , Rfl:  .  mirtazapine (REMERON) 7.5 MG tablet, Take  7.5 mg by mouth at bedtime., Disp: , Rfl:  .  ondansetron (ZOFRAN) 4 MG tablet, TAKE 1 TABLET(4 MG) BY MOUTH EVERY 8 HOURS AS NEEDED FOR NAUSEA OR VOMITING, Disp: 90 tablet, Rfl: 0 .  b complex vitamins tablet, Take 1 tablet by mouth daily., Disp: , Rfl:  .  ciprofloxacin (CIPRO) 250 MG tablet, Take 1 tablet (250 mg total) by mouth 2 (two) times daily., Disp: 14 tablet, Rfl: 0 .  dexamethasone (DECADRON) 4 MG tablet, Take 1 tablet by mouth daily., Disp: , Rfl:  .  OLANZapine (ZYPREXA) 5 MG tablet, TAKE 1 TABLET(5 MG) BY MOUTH AT BEDTIME (Patient not taking: Reported on 01/14/2019), Disp: 30 tablet, Rfl: 0 .  omeprazole (PRILOSEC) 40 MG capsule, Take 40 mg by mouth daily., Disp: , Rfl:  .  tamsulosin (FLOMAX) 0.4 MG CAPS capsule, , Disp: , Rfl:   Physical exam:  Vitals:   01/30/19 1152  BP: (!) 120/52  Pulse: (!) 118  Resp: 16  Temp: 98.7 F (37.1 C)  TempSrc: Tympanic  Weight: 102 lb 11.2 oz (46.6 kg)   Physical Exam Constitutional:      Comments: Thin elderly woman sitting in a wheelchair.  Appears fatigued  HENT:     Head: Normocephalic and atraumatic.  Eyes:     Pupils: Pupils are equal, round, and reactive to light.  Cardiovascular:     Rate and Rhythm: Regular rhythm. Tachycardia present.     Heart sounds: Normal heart sounds.  Pulmonary:     Effort: Pulmonary effort is normal.     Breath sounds: Normal breath sounds.  Abdominal:     General: Bowel sounds are normal.     Palpations: Abdomen is soft.  Musculoskeletal:     Cervical back: Normal range of motion.  Skin:    General: Skin is warm and dry.  Neurological:     Mental Status: She is alert and oriented to person, place, and time.      CMP Latest Ref Rng & Units 01/30/2019  Glucose 70 - 99 mg/dL 142(H)  BUN 8 - 23 mg/dL 33(H)  Creatinine 0.44 - 1.00 mg/dL 0.95  Sodium 135 - 145 mmol/L 138  Potassium 3.5 - 5.1 mmol/L 3.8  Chloride 98 - 111 mmol/L 103  CO2 22 - 32 mmol/L 25  Calcium 8.9 - 10.3 mg/dL 9.2    Total Protein 6.5 - 8.1 g/dL 7.2  Total Bilirubin 0.3 - 1.2 mg/dL 1.2  Alkaline Phos 38 - 126 U/L 54  AST 15 - 41 U/L 25  ALT 0 - 44 U/L 17   CBC Latest Ref Rng & Units 01/30/2019  WBC 4.0 - 10.5 K/uL 14.2(H)  Hemoglobin 12.0 - 15.0 g/dL 11.7(L)  Hematocrit 36.0 - 46.0 % 37.2  Platelets 150 - 400 K/uL 201      Assessment and plan- Patient is a 85 y.o. female with recurrent ovarian carcinoma here for an acute visit for ongoing fever and dehydration  Patient has a normal blood pressure but is tachycardic and appears fatigued and dehydrated.  Labs also show elevated white cell count/neutrophilia concerning for possible infection.  We will plan to give her 1 L of IV fluids today.  I will give her 1 dose of ciprofloxacin here in the clinic and send her home with Cipro 250 mg twice daily for 7 days.  Her urinalysis was positive I suspect a urinary tract infection as a cause of her fever.  Urine cultures have been ordered.  If patient does not symptomatically improve after antibiotic she will call us back and we will consider further work-up at that time including chest x-ray blood cultures and Covid testing.  Given that patient has had 3-4 urinary tract infections over the last 3 months, I advised him to get in touch with her urologist to see if any other intervention is needed.  Also recommended that she should discuss trying AZO with her urologist to see if it would benefit her in reducing the chances of urinary tract infections in the future.  Patient and her daughter verbalized understanding  She has follow-up appointments with me next month for her ovarian cancer which she will keep as   Visit Diagnosis 1. Acute cystitis without hematuria      Dr. Randa Evens, MD, MPH [facility] [id number] 02/02/2019 9:47 AM

## 2019-02-03 ENCOUNTER — Telehealth: Payer: Self-pay | Admitting: *Deleted

## 2019-02-03 NOTE — Telephone Encounter (Signed)
Called home and spoke to pt and she is feeling good, eating and drinking good, she is taking te atb and everything is well.

## 2019-02-18 DIAGNOSIS — I2510 Atherosclerotic heart disease of native coronary artery without angina pectoris: Secondary | ICD-10-CM | POA: Diagnosis not present

## 2019-02-18 DIAGNOSIS — I700 Atherosclerosis of aorta: Secondary | ICD-10-CM | POA: Diagnosis not present

## 2019-02-18 DIAGNOSIS — E782 Mixed hyperlipidemia: Secondary | ICD-10-CM | POA: Diagnosis not present

## 2019-02-18 DIAGNOSIS — Z950 Presence of cardiac pacemaker: Secondary | ICD-10-CM | POA: Diagnosis not present

## 2019-02-25 DIAGNOSIS — R300 Dysuria: Secondary | ICD-10-CM | POA: Diagnosis not present

## 2019-02-26 ENCOUNTER — Ambulatory Visit: Admission: RE | Admit: 2019-02-26 | Payer: Medicare HMO | Source: Ambulatory Visit

## 2019-03-04 ENCOUNTER — Inpatient Hospital Stay: Payer: Medicare HMO | Attending: Oncology

## 2019-03-04 ENCOUNTER — Ambulatory Visit
Admission: RE | Admit: 2019-03-04 | Discharge: 2019-03-04 | Disposition: A | Discharge status: Home / Self Care | Payer: Medicare HMO | Source: Ambulatory Visit | Attending: Oncology | Admitting: Oncology

## 2019-03-04 ENCOUNTER — Other Ambulatory Visit: Payer: Self-pay

## 2019-03-04 DIAGNOSIS — C569 Malignant neoplasm of unspecified ovary: Secondary | ICD-10-CM | POA: Diagnosis not present

## 2019-03-04 DIAGNOSIS — Z8543 Personal history of malignant neoplasm of ovary: Secondary | ICD-10-CM | POA: Insufficient documentation

## 2019-03-04 DIAGNOSIS — I2510 Atherosclerotic heart disease of native coronary artery without angina pectoris: Secondary | ICD-10-CM | POA: Diagnosis not present

## 2019-03-04 DIAGNOSIS — I700 Atherosclerosis of aorta: Secondary | ICD-10-CM | POA: Insufficient documentation

## 2019-03-04 DIAGNOSIS — N3000 Acute cystitis without hematuria: Secondary | ICD-10-CM | POA: Insufficient documentation

## 2019-03-04 DIAGNOSIS — N1330 Unspecified hydronephrosis: Secondary | ICD-10-CM | POA: Diagnosis not present

## 2019-03-04 MED ORDER — IOHEXOL 300 MG/ML  SOLN
75.0000 mL | Freq: Once | INTRAMUSCULAR | Status: AC | PRN
  Administered 2019-03-04: 10:00:00 75 mL via INTRAVENOUS

## 2019-03-05 LAB — CA 125: Cancer Antigen (CA) 125: 13.2 U/mL (ref 0.0–38.1)

## 2019-03-06 ENCOUNTER — Other Ambulatory Visit: Payer: Self-pay

## 2019-03-06 ENCOUNTER — Inpatient Hospital Stay: Payer: Medicare HMO

## 2019-03-06 ENCOUNTER — Inpatient Hospital Stay (HOSPITAL_BASED_OUTPATIENT_CLINIC_OR_DEPARTMENT_OTHER): Payer: Medicare HMO | Admitting: Oncology

## 2019-03-06 DIAGNOSIS — Z8543 Personal history of malignant neoplasm of ovary: Secondary | ICD-10-CM | POA: Diagnosis not present

## 2019-03-06 DIAGNOSIS — Z08 Encounter for follow-up examination after completed treatment for malignant neoplasm: Secondary | ICD-10-CM

## 2019-03-09 NOTE — Progress Notes (Signed)
I connected with Natalie Stone on 03/09/19 at 10:15 AM EST by video enabled telemedicine visit and verified that I am speaking with the correct person using two identifiers.   I discussed the limitations, risks, security and privacy concerns of performing an evaluation and management service by telemedicine and the availability of in-person appointments. I also discussed with the patient that there may be a patient responsible charge related to this service. The patient expressed understanding and agreed to proceed.  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 She underwent neoadjuvant chemotherapy followed by surgery with Dr. Beverly Milch followed by adjuvant chemotherapy. Genetic testing was negative. She then established care at [facility] in 2017. More recently patient was found to have a consistently increasing Ca1 25 level from 42.6 in December 20 18-781 in February 2020. Most recent Ca1 25 was 02/21/2005 on 07/06/2018. Scans were however negative for any recurrence. Patient had CT abdomen and pelvis with contrast done on 07/06/2018 which showed severe left hydro ureteral nephrosis. Sigmoid wall thickening differential includes carcinomatosis and colitis. She did had a repeat scan on 07/09/2018 which showed interval increase in ascites mostly within the pelvis with associated enhancement of the peritoneum suggesting carcinomatosis. Redemonstration of sigmoid wall thickening which may represent colitis versus carcinomatosis. Delayed left nephrogram with severe left hydro-ureteronephrosis similar to prior.  Given findings of colitis as well as ongoing symptoms of melena and  diarrhea patient underwent EGD as well as flexible sigmoidoscopy which showed Schatzki's ring and diverticulosis but no evidence of tumor. Her symptoms are possibly secondary to carcinomatosis. She also had other stool studies done including C. difficile and stool cultures which were negative.Patient did undergo a diagnostic tap of her ascites on 07/09/2018 which was consistent with adenocarcinoma  Patient started cycle 1 with single agent carboplatin on 07/28/2018 and finished 18 cycles of weekly carbotaxol on 11/28/2018  Interval history: She is recovering from her recent urinary tract infection and just completed her course of antibiotics.  She has started taking Azo.  Reports fatigue but denies other complaints.  Denies any abdominal pain distention or diarrhea.  In fact she has been struggling with constipation   Review of Systems  Constitutional: Positive for malaise/fatigue. Negative for chills, fever and weight loss.  HENT: Negative for congestion, ear discharge and nosebleeds.   Eyes: Negative for blurred vision.  Respiratory: Negative for cough, hemoptysis, sputum production, shortness of breath and wheezing.   Cardiovascular: Negative for chest pain, palpitations, orthopnea and claudication.  Gastrointestinal: Positive for constipation. Negative for abdominal pain, blood in stool, diarrhea, heartburn, melena, nausea and vomiting.  Genitourinary: Negative for dysuria, flank pain, frequency, hematuria and urgency.  Musculoskeletal: Negative for back pain, joint pain and myalgias.  Skin: Negative for rash.  Neurological: Negative for dizziness, tingling, focal weakness, seizures, weakness and headaches.  Endo/Heme/Allergies: Does not bruise/bleed easily.  Psychiatric/Behavioral: Negative for depression and suicidal ideas. The patient does not have insomnia.     Allergies  Allergen Reactions  . Penicillins Anaphylaxis    Has patient had a PCN reaction causing immediate rash,  facial/tongue/throat swelling, SOB or lightheadedness with hypotension: Yes Has patient had a PCN reaction causing severe rash involving mucus membranes or skin necrosis:  No Has patient had a PCN reaction that required hospitalization No Has patient had a PCN reaction occurring within the last 10 years: No If all of the above answers are "NO", then may proceed with Cephalosporin use.    Past Medical History:  Diagnosis Date  . CAD (coronary artery disease)   . Chronic kidney disease    urinary incontinence  . Hyperthyroidism   . Hypothyroidism (acquired)   . Ovarian cancer ([place]) 2014  . Shingles   . Third degree AV block ([place]) 10/09/2015    Past Surgical History:  Procedure Laterality Date  . ABDOMINAL HYSTERECTOMY    . BLADDER REPAIR    . CORONARY ANGIOPLASTY WITH STENT PLACEMENT  2001   Dr. Isaiah Blakes in [place]  . CYSTOURETHROSCOPY, WITH INSERTION OF INDWELLING URETERAL STENT (EG,GIBBONS OR DOUBLE-J TYPE) (Left Ureter Left   . PACEMAKER INSERTION N/A 10/11/2015   Procedure: INSERTION PACEMAKER;  Surgeon: [name], MD;  Location: [facility] ORS;  Service: Cardiovascular;  Laterality: N/A;  . PARTIAL HYSTERECTOMY    . PORTA CATH INSERTION N/A 07/24/2018   Procedure: PORTA CATH INSERTION;  Surgeon: Algernon Huxley, MD;  Location: [place] [facility];  Service: Cardiovascular;  Laterality: N/A;    Social History   Socioeconomic History  . Marital status: Widowed    Spouse name: Not on file  . Number of children: Not on file  . Years of education: Not on file  . Highest education level: Not on file  Occupational History  . Not on file  Tobacco Use  . Smoking status: Never Smoker  . Smokeless tobacco: Never Used  Substance and Sexual Activity  . Alcohol use: No  . Drug use: No  . Sexual activity: Never  Other Topics Concern  . 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 Difficulty of Paying Living Expenses: Not on file  Food Insecurity:   . Worried About Charity fundraiser in the Last Year: Not on file  . Ran Out of Food in the Last Year: Not on file  Transportation Needs:   . Lack of Transportation (Medical): Not on file  . Lack of Transportation (Non-Medical): Not on file  Physical Activity:   . Days of Exercise per Week: Not on file  . Minutes of Exercise per Session: Not on file  Stress:   . Feeling of Stress : Not on file  Social Connections:   . Frequency of Communication with Friends and Family: Not on file  . Frequency of Social Gatherings with Friends and Family: Not on file  . Attends Religious Services: Not on file  . Active Member of Clubs or Organizations: Not on file  . Attends Archivist Meetings: Not on file  . Marital Status: Not on file  Intimate Partner Violence:   . Fear of Current or Ex-Partner: Not on file  . Emotionally Abused: Not on file  . Physically Abused: Not on file  . Sexually Abused: Not on file    Family History  Problem Relation Age of Onset  . Diabetes Brother      Current Outpatient Medications:  .  aspirin EC 81 MG tablet, Take 81 mg by mouth daily., Disp: , Rfl:  .  atorvastatin (LIPITOR) 10 MG tablet, Take 10 mg by mouth at bedtime. , Disp: , Rfl:  .  b complex vitamins tablet, Take 1 tablet by mouth daily., Disp: , Rfl:  .  Bioflavonoid Products (ESTER C PO), Take 500 mg by mouth daily. , Disp: , Rfl:  .  Calcium Carbonate-Vitamin D (CALTRATE 600+D PO), Take 1 tablet by mouth daily., Disp: , Rfl:  .  Cholecalciferol (VITAMIN D3) 1000 units CAPS, Take 1,000 Units by mouth daily. , Disp: , Rfl:  .  ciprofloxacin (CIPRO) 250 MG tablet, Take 1 tablet (250 mg total) by mouth 2 (two) times daily., Disp: 14 tablet, Rfl: 0 .  Coenzyme Q10 (COQ10) 100 MG CAPS, Take 100 mg by mouth daily., Disp: , Rfl:  .  dexamethasone (DECADRON) 4 MG tablet, Take 1 tablet by mouth  daily., Disp: , Rfl:  .  Digestive Enzymes (DIGESTIVE ENZYME PO), Take 1 Dose by mouth 3 (three) times daily before meals., Disp: , Rfl:  .  Glucosamine-MSM-Hyaluronic Acd (JOINT HEALTH PO), Take 1 tablet by mouth 2 (two) times a day. , Disp: , Rfl:  .  hydrocortisone 2.5 % cream, Apply 1 application topically daily as needed. , Disp: , Rfl:  .  levothyroxine (SYNTHROID) 100 MCG tablet, Take 100 mcg by mouth daily before breakfast. , Disp: , Rfl:  .  mirtazapine (REMERON) 7.5 MG tablet, Take 7.5 mg by mouth at bedtime., Disp: , Rfl:  .  OLANZapine (ZYPREXA) 5 MG tablet, TAKE 1 TABLET(5 MG) BY MOUTH AT BEDTIME (Patient not taking: Reported on 01/14/2019), Disp: 30 tablet, Rfl: 0 .  omeprazole (PRILOSEC) 40 MG capsule, Take 40 mg by mouth daily., Disp: , Rfl:  .  ondansetron (ZOFRAN) 4 MG tablet, TAKE 1 TABLET(4 MG) BY MOUTH EVERY 8 HOURS AS NEEDED FOR NAUSEA OR VOMITING, Disp: 90 tablet, Rfl: 0 .  tamsulosin (FLOMAX) 0.4 MG CAPS capsule, , Disp: , Rfl:   CT Chest W Contrast  Result Date: 03/04/2019 CLINICAL DATA:  Restaging ovarian cancer EXAM: CT CHEST, ABDOMEN, AND PELVIS WITH CONTRAST TECHNIQUE: Multidetector CT imaging of the chest, abdomen and pelvis was performed following the standard protocol during bolus administration of intravenous contrast. CONTRAST:  50mL OMNIPAQUE IOHEXOL 300 MG/ML SOLN, additional oral enteric contrast COMPARISON:  11/11/2018, 09/04/2018 FINDINGS: CT CHEST FINDINGS Cardiovascular: Right chest port catheter. Left chest multi lead pacer defibrillator. Normal heart size. Coronary artery calcifications and/or stents. No pericardial effusion. Mediastinum/Nodes: No enlarged mediastinal, hilar, or axillary lymph nodes. Thyroid gland, trachea, and esophagus demonstrate no significant findings. Lungs/Pleura: Bandlike scarring of the bilateral lung bases. No pleural effusion or pneumothorax. Musculoskeletal: No chest wall mass or suspicious bone lesions identified. CT ABDOMEN PELVIS  FINDINGS Hepatobiliary: No solid liver abnormality is seen. No gallstones, gallbladder wall thickening, or biliary dilatation. Pancreas: Unremarkable. No pancreatic ductal dilatation or surrounding inflammatory changes. Spleen: Normal in size without significant abnormality. Adrenals/Urinary Tract: Adrenal glands are unremarkable. Unchanged position of a left-sided double-J ureteral stent, which is low lying, formed pigtail within the proximal left ureter, with unchanged moderate left hydronephrosis and proximal hydroureter. The distal pigtail is formed in the urinary bladder. Bladder is unremarkable. No right-sided hydronephrosis. Stomach/Bowel: Stomach is within normal limits. Appendix appears normal. No evidence of bowel wall thickening, distention, or inflammatory changes. Large burden of stool throughout the colon. Vascular/Lymphatic: No significant vascular findings are present. No enlarged abdominal or pelvic lymph nodes. Reproductive: Status post hysterectomy. Interval decrease in size of a mass in the low right hemipelvis adjacent to the rectum measuring approximately 2.3 x 1.0 cm, previously 3.4 x 1.8 cm when measured similarly (series  2, image 92). Other: No abdominal wall hernia or abnormality. No abdominopelvic ascites. Musculoskeletal: No acute or significant osseous findings. IMPRESSION: 1. Interval decrease in size of a mass in the low right hemipelvis adjacent to the rectum measuring approximately 2.3 x 1.0 cm, previously 3.4 x 1.8 cm when measured similarly (series 2, image 92). Findings are consistent with treatment response. 2. No evidence of residual peritoneal or metastatic disease in the abdomen or pelvis. No evidence of metastatic disease in the chest. 3. Stable, low lying position of left-sided ureteral stent, with unchanged moderate left hydronephrosis and proximal hydroureter. 4. Large burden of stool throughout the colon. 5. Coronary artery disease.  Aortic Atherosclerosis (ICD10-I70.0).  Electronically Signed   By: Eddie Candle M.D.   On: 03/04/2019 10:06   CT Abdomen Pelvis W Contrast  Result Date: 03/04/2019 CLINICAL DATA:  Restaging ovarian cancer EXAM: CT CHEST, ABDOMEN, AND PELVIS WITH CONTRAST TECHNIQUE: Multidetector CT imaging of the chest, abdomen and pelvis was performed following the standard protocol during bolus administration of intravenous contrast. CONTRAST:  42mL OMNIPAQUE IOHEXOL 300 MG/ML SOLN, additional oral enteric contrast COMPARISON:  11/11/2018, 09/04/2018 FINDINGS: CT CHEST FINDINGS Cardiovascular: Right chest port catheter. Left chest multi lead pacer defibrillator. Normal heart size. Coronary artery calcifications and/or stents. No pericardial effusion. Mediastinum/Nodes: No enlarged mediastinal, hilar, or axillary lymph nodes. Thyroid gland, trachea, and esophagus demonstrate no significant findings. Lungs/Pleura: Bandlike scarring of the bilateral lung bases. No pleural effusion or pneumothorax. Musculoskeletal: No chest wall mass or suspicious bone lesions identified. CT ABDOMEN PELVIS FINDINGS Hepatobiliary: No solid liver abnormality is seen. No gallstones, gallbladder wall thickening, or biliary dilatation. Pancreas: Unremarkable. No pancreatic ductal dilatation or surrounding inflammatory changes. Spleen: Normal in size without significant abnormality. Adrenals/Urinary Tract: Adrenal glands are unremarkable. Unchanged position of a left-sided double-J ureteral stent, which is low lying, formed pigtail within the proximal left ureter, with unchanged moderate left hydronephrosis and proximal hydroureter. The distal pigtail is formed in the urinary bladder. Bladder is unremarkable. No right-sided hydronephrosis. Stomach/Bowel: Stomach is within normal limits. Appendix appears normal. No evidence of bowel wall thickening, distention, or inflammatory changes. Large burden of stool throughout the colon. Vascular/Lymphatic: No significant vascular findings are  present. No enlarged abdominal or pelvic lymph nodes. Reproductive: Status post hysterectomy. Interval decrease in size of a mass in the low right hemipelvis adjacent to the rectum measuring approximately 2.3 x 1.0 cm, previously 3.4 x 1.8 cm when measured similarly (series 2, image 92). Other: No abdominal wall hernia or abnormality. No abdominopelvic ascites. Musculoskeletal: No acute or significant osseous findings. IMPRESSION: 1. Interval decrease in size of a mass in the low right hemipelvis adjacent to the rectum measuring approximately 2.3 x 1.0 cm, previously 3.4 x 1.8 cm when measured similarly (series 2, image 92). Findings are consistent with treatment response. 2. No evidence of residual peritoneal or metastatic disease in the abdomen or pelvis. No evidence of metastatic disease in the chest. 3. Stable, low lying position of left-sided ureteral stent, with unchanged moderate left hydronephrosis and proximal hydroureter. 4. Large burden of stool throughout the colon. 5. Coronary artery disease.  Aortic Atherosclerosis (ICD10-I70.0). Electronically Signed   By: Eddie Candle M.D.   On: 03/04/2019 10:06    No images are attached to the encounter.   CMP Latest Ref Rng & Units 01/30/2019  Glucose 70 - 99 mg/dL 142(H)  BUN 8 - 23 mg/dL 33(H)  Creatinine 0.44 - 1.00 mg/dL 0.95  Sodium 135 - 145 mmol/L 138  Potassium 3.5 - 5.1 mmol/L 3.8  Chloride 98 - 111 mmol/L 103  CO2 22 - 32 mmol/L 25  Calcium 8.9 - 10.3 mg/dL 9.2  Total Protein 6.5 - 8.1 g/dL 7.2  Total Bilirubin 0.3 - 1.2 mg/dL 1.2  Alkaline Phos 38 - 126 U/L 54  AST 15 - 41 U/L 25  ALT 0 - 44 U/L 17   CBC Latest Ref Rng & Units 01/30/2019  WBC 4.0 - 10.5 K/uL 14.2(H)  Hemoglobin 12.0 - 15.0 g/dL 11.7(L)  Hematocrit 36.0 - 46.0 % 37.2  Platelets 150 - 400 K/uL 201     Observation/objective: Appears in no acute distress on video visit today.  Breathing is nonlabored  Assessment and plan: Patient is a 86 year old female with  history of recurrent ovarian carcinoma.  This is a visit to discuss CT scan results and further management  Patient's most recent CA-125 from 03/04/2019 was normal at 13.2.  I also reviewed CT chest abdomen pelvis images independently and discussed findings with the patient.At this time there is no concern for recurrent or progressive disease.  She does have persistent masslike thickening in the right hemipelvis which appears smaller as compared to before.  No CT evidence of peritoneal or metastatic disease in abdomen or chest.  Will be continuing to do CT scans every 6 months  I also encouraged the patient to take the Covid vaccine when she can get an appointment.  Follow-up instructions: Check CBC with differential, CMP and CA-125 in 2 months and 5 months and I will see her back in 5 months with CT scans prior for a video visit  I discussed the assessment and treatment plan with the patient. The patient was provided an opportunity to ask questions and all were answered. The patient agreed with the plan and demonstrated an understanding of the instructions.   The patient was advised to call back or seek an in-person evaluation if the symptoms worsen or if the condition fails to improve as anticipated.    Visit Diagnosis: 1. Encounter for follow-up surveillance of ovarian cancer     Dr. Randa Evens, MD, MPH [facility] at [facility] Tel- [phone] 03/09/2019 1:01 PM

## 2019-04-14 ENCOUNTER — Other Ambulatory Visit: Payer: Self-pay

## 2019-04-14 NOTE — Progress Notes (Signed)
Patient pre screened for office appointment, no questions or concerns today. Patient reminded of upcoming appointment time and date. 

## 2019-04-15 ENCOUNTER — Other Ambulatory Visit: Payer: Self-pay

## 2019-04-15 ENCOUNTER — Inpatient Hospital Stay: Payer: Medicare HMO | Attending: Obstetrics and Gynecology | Admitting: Obstetrics and Gynecology

## 2019-04-15 ENCOUNTER — Inpatient Hospital Stay: Payer: Medicare HMO

## 2019-04-15 VITALS — BP 137/77 | HR 111 | Temp 99.0°F | Resp 18 | Wt 107.0 lb

## 2019-04-15 DIAGNOSIS — C785 Secondary malignant neoplasm of large intestine and rectum: Secondary | ICD-10-CM | POA: Diagnosis not present

## 2019-04-15 DIAGNOSIS — E039 Hypothyroidism, unspecified: Secondary | ICD-10-CM | POA: Diagnosis not present

## 2019-04-15 DIAGNOSIS — I700 Atherosclerosis of aorta: Secondary | ICD-10-CM | POA: Diagnosis not present

## 2019-04-15 DIAGNOSIS — N3941 Urge incontinence: Secondary | ICD-10-CM | POA: Diagnosis not present

## 2019-04-15 DIAGNOSIS — N898 Other specified noninflammatory disorders of vagina: Secondary | ICD-10-CM | POA: Insufficient documentation

## 2019-04-15 DIAGNOSIS — E782 Mixed hyperlipidemia: Secondary | ICD-10-CM | POA: Diagnosis not present

## 2019-04-15 DIAGNOSIS — Z79899 Other long term (current) drug therapy: Secondary | ICD-10-CM | POA: Insufficient documentation

## 2019-04-15 DIAGNOSIS — R5383 Other fatigue: Secondary | ICD-10-CM | POA: Diagnosis not present

## 2019-04-15 DIAGNOSIS — C569 Malignant neoplasm of unspecified ovary: Secondary | ICD-10-CM | POA: Insufficient documentation

## 2019-04-15 DIAGNOSIS — N189 Chronic kidney disease, unspecified: Secondary | ICD-10-CM | POA: Insufficient documentation

## 2019-04-15 DIAGNOSIS — K5900 Constipation, unspecified: Secondary | ICD-10-CM | POA: Diagnosis not present

## 2019-04-15 DIAGNOSIS — Z950 Presence of cardiac pacemaker: Secondary | ICD-10-CM | POA: Insufficient documentation

## 2019-04-15 DIAGNOSIS — R001 Bradycardia, unspecified: Secondary | ICD-10-CM | POA: Diagnosis not present

## 2019-04-15 DIAGNOSIS — Z9221 Personal history of antineoplastic chemotherapy: Secondary | ICD-10-CM | POA: Diagnosis not present

## 2019-04-15 DIAGNOSIS — C786 Secondary malignant neoplasm of retroperitoneum and peritoneum: Secondary | ICD-10-CM | POA: Diagnosis not present

## 2019-04-15 DIAGNOSIS — Z95828 Presence of other vascular implants and grafts: Secondary | ICD-10-CM

## 2019-04-15 DIAGNOSIS — I2510 Atherosclerotic heart disease of native coronary artery without angina pectoris: Secondary | ICD-10-CM | POA: Diagnosis not present

## 2019-04-15 DIAGNOSIS — Z90710 Acquired absence of both cervix and uterus: Secondary | ICD-10-CM | POA: Diagnosis not present

## 2019-04-15 DIAGNOSIS — Z90722 Acquired absence of ovaries, bilateral: Secondary | ICD-10-CM | POA: Diagnosis not present

## 2019-04-15 MED ORDER — HEPARIN SOD (PORK) LOCK FLUSH 100 UNIT/ML IV SOLN
500.0000 [IU] | Freq: Once | INTRAVENOUS | Status: AC
  Administered 2019-04-15: 500 [IU] via INTRAVENOUS
  Filled 2019-04-15: qty 5

## 2019-04-15 MED ORDER — SODIUM CHLORIDE 0.9% FLUSH
10.0000 mL | Freq: Once | INTRAVENOUS | Status: AC
  Administered 2019-04-15: 10 mL via INTRAVENOUS
  Filled 2019-04-15: qty 10

## 2019-04-15 NOTE — Progress Notes (Signed)
Gynecologic Oncology Interval Visit   Referring Provider: Dr. Janese Banks  Chief Concern: recurrent high grade ovarian cancer   Subjective:  Natalie Stone is a 86 y.o. female with advanced ovarian cancer treated in 2013 and recurrence diagnosed 5/20 now s/p 18 cycles of weekly chemotherapy with normalization of CA125, who returns to clinic today for follow-up.   Today, she continues to have some fatigue and constipation. Doing well otherwise since finishing chemo 10/20.  She initiated weekly carbo AUC 2 on 07/28/2018 for recurrent disease.  For week 4, Taxol 60 mg/m was added.  She completed 18 cycles of weekly dosing on 11/28/2018 with normalization of CA125 from 1326 to 13.   Interval CT imaging on 03/04/19 showed interval decrease in size of mass in the low right hemipelvis adjacent to rectum measuring 2.3 x 1.0 (previously 3.4 x 1.8 cm on 11/11/2018 imaging) consistent with treatment response. No evidence of residual peritoneal or metastatic disease elsewhere.   Oncology History Ovarian cancer- stage III per patient [no records available]. Status post neoadjuvant chemotherapy followed by surgery with Dr Eppie Gibson in [place] in 2013; followed by adjuvant chemotherapy. Clinically no concerns for recurrence at this time.  She said she had negative genetic testing.  Establish care at [facility] in 2017 since she moved back to this area to be near family.  She is grandmother of Dr. Benjaman Kindler.  CA125 3/18 =13.7  02/05/2017- CT C/A/P IMPRESSION: 1. 14 x 13 mm low-density oval mass in the right retrocrural location at the level of the aortic hiatus. This may reflect a mildly enlarged retrocrural lymph node. This could potentially reflect metastatic adenopathy. It could reflect a small lymphangiocele or chronic reactive lymph node. If there are any previous CT exams available for comparison, these could help exclude metastatic disease. If not, follow-up chest CT in 2-3 months to reassess for stability would be  recommended. 2. No other findings to suggest metastatic disease. No evidence of locally recurrent ovarian carcinoma. Status post hysterectomy. 3. No acute findings. 4. Coronary artery calcifications.  Aortic atherosclerosis. 5. Mild increased colonic stool burden.  CA125 1/19 = 56.5  05/01/2017- CT C/A/P IMPRESSION: -Status post hysterectomy and suspected bilateral salpingo-oophorectomy. - No findings specific for recurrent or metastatic disease.  - Benign cystic lesion in the right retrocrural space is likely related to the thoracic duct. This is not considered suspicious for a lymph node. - 4 mm subpleural node in the posterior right middle lobe, new, nonspecific. This appearance is favored to be infectious/inflammatory. However, consider follow-up CT chest in 6 months.  CA125 5/19 = 174.9   06/27/17 CT C/A/P IMPRESSION: 1. There is a small amount of complex fluid within the pelvis with peripheral peritoneal enhancement. Findings are indeterminate but concerning for the possibility of recurrent disease. 2. Additionally within the pelvis there is a small hyperdense nodule which likely represents a hyperdense diverticulum as opposed to a peritoneal nodule as no definite additional peritoneal nodules are identified.  Recommend attention on follow-up.  3. Slight interval decrease in size of right middle lobe nodule, likely resolving infectious/inflammatory process. Recommend attention on follow-up.  Reviewed the May 2019 CT scan with radiology and there is nothing that is amenable to biopsy.   CA-125 6/19-  210.2  Mild SUI.  She had bladder repair many years ago for SUI after having children.  Has been treated with anticholenergics in past, but mouth was very dry and had problems talking so stopped the medication.  Now uses Mirabegron 50 mg qd (  a B-agonist) for urge incontinence per Dr Buel Ream suggestion.  No symptoms since she started this medication.   Since 10/18 CA125 has risen  progressively from 33 to >1300.  She has had multiple negative scans of the chest/abd/pelvis.  In 5/20 she was admitted to [facility] and diagnosed with pelvic recurrence of her cancer and decision made to treat with Carboplain/Taxol.     Last screening colonoscopy 2013.    In 5/19 the Invitae 83 gene panel was negative for pathogenic mutations. Had one VUS in the MET gene.   CT chest 10/30/17 showed no findings suspicious for metastatic disease. Solitary 52m RML nodule slightly decreased & though to be benign.   CT abd/pelvis 11/19 was negative for acute findings or recurrent tumor.   Seen by Dr. BFransisca Connorson 02/05/2018.  NED and asymptomatic at that time.  Normal CT scan and normal MRI of brain.  [facility] pain, fatigue, nausea, poor appetite, and weight loss. She was found to have melena and admitted for concerns of recurrence of ovarian cancer. CA 125 was 1307 and CT demonstrated carcinomatosis and soft tissue pelvic mass, and hydronephrosis of left kidney. Biopsy was discussed but felt not to be feasible. Paracentesis was performed on 5/20 and cytology consistent with adenocarcinoma. She underwent left ureteral stent placement on 5/20. Given melena and diarrhea she also underwent flex sig and EGD which noted mild colonic edema, diverticulosis, and Schatski ring but no evidence of tumor burden or acute pathology. C diff, fecal leukocytes, stool O&P, stool culture, fecal calprotectin, and h pylori were negative. Ultimately melena and diarrhea felt to be secondary to carcinomatosis. After extensive discussion of goals of care with patient and family, she elected to reinitiate chemotherapy.    She initiated weekly carbo AUC 2 on 07/28/2018 for recurrent disease.  For week 4, Taxol 60 mg/m was added.    CT scan 11/11/18-  1. Stable bilobed cystic lesion in the right pelvis. 2. Resolution of ascites. 3. Two small enhancing nodules in the left abdomen adjacent to  the ascending colon could be small stable peritoneal nodules or diverticuli. 4. No omental or peritoneal surface lesions are identified elsewhere. 5. No findings for metastatic disease involving the chest. 6. Stable advanced atherosclerotic vascular calcifications in the chest, abdomen and pelvis. 7. Stable left-sided double-J ureteral stent with minimal residual left-sided hydronephrosis.  She completed 18 cycles of weekly dosing on 11/28/2018. D15, Cycle 6 was held due to fatigue and weakness. She also suffered anemia and leukopenia with treatment.  CA125 normalized.   Left ureteral stent changed by Dr PKandee Keen11/19/20 and retrograde showed persistent stricture of distal ureter.  Plan to change again in 4-5 months.   Recent UTI treated with antibiotics.    CT scan 1/21 IMPRESSION: 1. Interval decrease in size of a mass in the low right hemipelvis adjacent to the rectum measuring approximately 2.3 x 1.0 cm, previously 3.4 x 1.8 cm when measured similarly (series 2, image 92). Findings are consistent with treatment response. 2. No evidence of residual peritoneal or metastatic disease in the abdomen or pelvis. No evidence of metastatic disease in the chest. 3. Stable, low lying position of left-sided ureteral stent, with unchanged moderate left hydronephrosis and proximal hydroureter. 4. Large burden of stool throughout the colon. 5. Coronary artery disease.  Aortic Atherosclerosis (ICD10-I70.0).  Problem List: Patient Active Problem List   Diagnosis Date Noted  . Chemotherapy induced neutropenia ([place] 09/01/2018  . Goals of care,  counseling/discussion 07/21/2018  . CINV (chemotherapy-induced nausea and vomiting) 07/08/2018  . Diarrhea 07/06/2018  . Hydronephrosis of left kidney 07/06/2018  . Combined hyperlipidemia 02/07/2017  . Adult idiopathic generalized osteoporosis 11/05/2016  . Medicare annual wellness visit, initial 11/05/2016  . Malignant neoplasm of ovary ([place]) 11/23/2015  .  Acquired hypothyroidism 10/26/2015  . Aortic atherosclerosis ([place]) 10/26/2015  . Cardiac pacemaker 10/26/2015  . Chemotherapy-induced neuropathy ([place]) 10/26/2015  . Coronary artery disease involving native coronary artery of native heart without angina pectoris 10/26/2015  . Pacemaker pocket hematoma 10/26/2015  . Status post placement of cardiac pacemaker 10/26/2015  . Bradycardia 10/09/2015  . Third degree AV block ([place]) 10/09/2015    Past Medical History: Past Medical History:  Diagnosis Date  . CAD (coronary artery disease)   . Chronic kidney disease    urinary incontinence  . Hyperthyroidism   . Hypothyroidism (acquired)   . Ovarian cancer ([place]) 2014  . Shingles   . Third degree AV block ([place]) 10/09/2015    Past Surgical History: Past Surgical History:  Procedure Laterality Date  . ABDOMINAL HYSTERECTOMY    . BLADDER REPAIR    . CORONARY ANGIOPLASTY WITH STENT PLACEMENT  2001   Dr. Isaiah Blakes in [place]  . CYSTOURETHROSCOPY, WITH INSERTION OF INDWELLING URETERAL STENT (EG,GIBBONS OR DOUBLE-J TYPE) (Left Ureter Left   . PACEMAKER INSERTION N/A 10/11/2015   Procedure: INSERTION PACEMAKER;  Surgeon: [name], MD;  Location: [facility] ORS;  Service: Cardiovascular;  Laterality: N/A;  . PARTIAL HYSTERECTOMY    . PORTA CATH INSERTION N/A 07/24/2018   Procedure: PORTA CATH INSERTION;  Surgeon: Algernon Huxley, MD;  Location: [facility];  Service: Cardiovascular;  Laterality: N/A;     Family History: Family History  Problem Relation Age of Onset  . Diabetes Brother     Social History: Social History   Socioeconomic History  . Marital status: Widowed    Spouse name: Not on file  . Number of children: Not on file  . Years of education: Not on file  . Highest education level: Not on file  Occupational History  . Not on file  Tobacco Use  . Smoking status: Never Smoker  . Smokeless tobacco: Never Used  Substance and Sexual Activity  . Alcohol use: No  .  Drug use: No  . Sexual activity: Never  Other Topics Concern  . 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 Difficulty of Paying Living Expenses: Not on file  Food Insecurity:   . Worried About Charity fundraiser in the Last Year: Not on file  . Ran Out of Food in the Last Year: Not on file  Transportation Needs:   . Lack of Transportation (Medical): Not on file  . Lack of Transportation (Non-Medical): Not on file  Physical Activity:   . Days of Exercise per Week: Not on file  . Minutes of Exercise per Session: Not on file  Stress:   . Feeling of Stress : Not on file  Social Connections:   . Frequency of Communication with Friends and Family: Not on file  . Frequency of Social Gatherings with Friends and Family: Not on file  . Attends Religious Services: Not on file  . Active Member of Clubs or Organizations: Not on file  . Attends Archivist Meetings: Not on file  .  Marital Status: Not on file  Intimate Partner Violence:   . Fear of Current or Ex-Partner: Not on file  . Emotionally Abused: Not on file  . Physically Abused: Not on file  . Sexually Abused: Not on file    Allergies: Allergies  Allergen Reactions  . Penicillins Anaphylaxis    Has patient had a PCN reaction causing immediate rash, facial/tongue/throat swelling, SOB or lightheadedness with hypotension: Yes Has patient had a PCN reaction causing severe rash involving mucus membranes or skin necrosis: No Has patient had a PCN reaction that required hospitalization No Has patient had a PCN reaction occurring within the last 10 years: No If all of the above answers are "NO", then may proceed with Cephalosporin use.    Current Medications: Current Outpatient Medications  Medication Sig Dispense Refill  . aspirin EC 81 MG tablet Take 81 mg by mouth daily.    Marland Kitchen  atorvastatin (LIPITOR) 10 MG tablet Take 10 mg by mouth at bedtime.     Marland Kitchen b complex vitamins tablet Take 1 tablet by mouth daily.    Marland Kitchen Bioflavonoid Products (ESTER C PO) Take 500 mg by mouth daily.     . Calcium Carbonate-Vitamin D (CALTRATE 600+D PO) Take 1 tablet by mouth daily.    . Cholecalciferol (VITAMIN D3) 1000 units CAPS Take 1,000 Units by mouth daily.     . ciprofloxacin (CIPRO) 250 MG tablet Take 1 tablet (250 mg total) by mouth 2 (two) times daily. 14 tablet 0  . Coenzyme Q10 (COQ10) 100 MG CAPS Take 100 mg by mouth daily.    Marland Kitchen dexamethasone (DECADRON) 4 MG tablet Take 1 tablet by mouth daily.    . Digestive Enzymes (DIGESTIVE ENZYME PO) Take 1 Dose by mouth 3 (three) times daily before meals.    . Glucosamine-MSM-Hyaluronic Acd (JOINT HEALTH PO) Take 1 tablet by mouth 2 (two) times a day.     . hydrocortisone 2.5 % cream Apply 1 application topically daily as needed.     Marland Kitchen levothyroxine (SYNTHROID) 100 MCG tablet Take 100 mcg by mouth daily before breakfast.     . mirtazapine (REMERON) 7.5 MG tablet Take 7.5 mg by mouth at bedtime.    Marland Kitchen OLANZapine (ZYPREXA) 5 MG tablet TAKE 1 TABLET(5 MG) BY MOUTH AT BEDTIME 30 tablet 0  . omeprazole (PRILOSEC) 40 MG capsule Take 40 mg by mouth daily.    . ondansetron (ZOFRAN) 4 MG tablet TAKE 1 TABLET(4 MG) BY MOUTH EVERY 8 HOURS AS NEEDED FOR NAUSEA OR VOMITING 90 tablet 0  . tamsulosin (FLOMAX) 0.4 MG CAPS capsule      No current facility-administered medications for this visit.   Review of Systems General:  no complaints Skin: no complaints Eyes: no complaints HEENT: no complaints Breasts: no complaints Pulmonary: no complaints Cardiac: no complaints Gastrointestinal: no complaints Genitourinary/Sexual: no complaints Ob/Gyn: no complaints Musculoskeletal: no complaints Hematology: no complaints Neurologic/Psych: no complaints   Objective:  Physical Examination:  BP 137/77 (BP Location: Left Arm, Patient Position: Sitting)    Pulse (!) 111   Temp 99 F (37.2 C) (Tympanic)   Resp 18   Wt 107 lb (48.5 kg)   SpO2 100%   BMI 20.22 kg/m    ECOG Performance Status: 2 - Symptomatic, <50% confined to bed  GENERAL: Patient is a well appearing female in no acute distress. Thin built.  HEENT:  Sclera clear. Anicteric NODES:  Negative axillary, supraclavicular, inguinal lymph node survery LUNGS:  Clear to auscultation bilaterally.  HEART:  Regular rate and rhythm.  ABDOMEN:  Soft, nontender.  No hernias, incisions well healed. No masses or ascites EXTREMITIES:  No peripheral edema. Atraumatic. No cyanosis SKIN:  Clear with no obvious rashes or skin changes.  NEURO:  Nonfocal. Well oriented.  Appropriate affect.  Pelvic: EGBUS/Vagina atrophic, small skin fissure along right lower labia.  No infection.  Bimanual/RV: no masses.      Assessment:  Natalie Stone is a 86 y.o. female diagnosed with advanced ovarian cancer treated with neoadjuvant chemo and interval debulking in 2013 in [place].    Germline genetic testing normal.  In 5/19 the Invitae 83 gene panel was negative for pathogenic mutations.   Recurrent peritoneal disease documented after CA-125 had been progressively rising for over a year with normal CT scans. Ureteral stent placed on left due to hydronephrosis. Started weekly carbo/taxol chemotherapy 6/20 and CA125 came down from 1300 to 13 in 10/20 after 6 cycles.  CT scan in January showed good response with residual 2 cm mass in pelvis and CA125 stable at 13.   Left ureteral stent changed by Dr Kandee Keen 01/08/19 and retrograde showed persistent stricture of distal ureter.  Plan to change again in 4-5 months.   She has had some occasional UTIs and bladder irritative symptoms.  CT scan 1/20 shows low lying stent.     Some vulvar irritation on right due to small skin fissure.   Urge incontinence s/p surgery for SUI in the past.  Now uses Mirabegron 50 mg qd (a B-agonist) for urge incontinence per Dr Buel Ream  suggestion with good results and no symptoms.   Plan:   Problem List Items Addressed This Visit      Endocrine   Malignant neoplasm of ovary ([place]) - Primary     We will see her back for follow up in 3 months and Dr Janese Banks will continue to follow her as well post chemotherapy.     She will follow up with Dr Kandee Keen regarding left ureteral stent now as this is low lying and coil not in renal pelvis, although renal pelvis is decompressed. She is in remission from cancer and maybe stent could be removed, but stricture may be due to residual scar tissue.  Told her that she may have to live with the stent because we are not planning surgery related to treatment of her ovarian cancer.   She has negative germline genetic testing, but somatic testing could be done in the future to see if she is a candidate for a PARP inhibitor, but would have to get tumor from original surgery.    Advised [place] ointment for small skin fissure adjacent to right labia.   The patient's diagnosis, an outline of the further diagnostic and laboratory studies which will be required, the recommendation, and alternatives were discussed for 25 minutes.  All questions were answered to the patient's satisfaction.  Beckey Rutter, DNP, AGNP-C [place] [facility] [phone] (clinic)  I personally interviewed and examined the patient. Agreed with the above/below plan of care. I have directly contributed to assessment and plan of care of this patient and educated and discussed with patient and family.  Mellody Drown, MD  CC:  Rusty Aus, MD [facility] [place] [address] [place],  [place] [address] [phone]

## 2019-04-16 DIAGNOSIS — R300 Dysuria: Secondary | ICD-10-CM | POA: Diagnosis not present

## 2019-04-24 DIAGNOSIS — R399 Unspecified symptoms and signs involving the genitourinary system: Secondary | ICD-10-CM | POA: Diagnosis not present

## 2019-05-04 ENCOUNTER — Other Ambulatory Visit: Payer: Self-pay

## 2019-05-04 ENCOUNTER — Inpatient Hospital Stay: Payer: Medicare HMO | Attending: Oncology

## 2019-05-04 DIAGNOSIS — Z90722 Acquired absence of ovaries, bilateral: Secondary | ICD-10-CM | POA: Diagnosis not present

## 2019-05-04 DIAGNOSIS — I442 Atrioventricular block, complete: Secondary | ICD-10-CM | POA: Diagnosis not present

## 2019-05-04 DIAGNOSIS — Z7982 Long term (current) use of aspirin: Secondary | ICD-10-CM | POA: Diagnosis not present

## 2019-05-04 DIAGNOSIS — Z90710 Acquired absence of both cervix and uterus: Secondary | ICD-10-CM | POA: Diagnosis not present

## 2019-05-04 DIAGNOSIS — Z95828 Presence of other vascular implants and grafts: Secondary | ICD-10-CM

## 2019-05-04 DIAGNOSIS — Z79899 Other long term (current) drug therapy: Secondary | ICD-10-CM | POA: Diagnosis not present

## 2019-05-04 DIAGNOSIS — C569 Malignant neoplasm of unspecified ovary: Secondary | ICD-10-CM | POA: Insufficient documentation

## 2019-05-04 DIAGNOSIS — E039 Hypothyroidism, unspecified: Secondary | ICD-10-CM | POA: Diagnosis not present

## 2019-05-04 DIAGNOSIS — N1330 Unspecified hydronephrosis: Secondary | ICD-10-CM | POA: Diagnosis not present

## 2019-05-04 DIAGNOSIS — Z950 Presence of cardiac pacemaker: Secondary | ICD-10-CM | POA: Diagnosis not present

## 2019-05-04 DIAGNOSIS — N131 Hydronephrosis with ureteral stricture, not elsewhere classified: Secondary | ICD-10-CM | POA: Diagnosis not present

## 2019-05-04 DIAGNOSIS — Z8543 Personal history of malignant neoplasm of ovary: Secondary | ICD-10-CM

## 2019-05-04 DIAGNOSIS — C786 Secondary malignant neoplasm of retroperitoneum and peritoneum: Secondary | ICD-10-CM | POA: Diagnosis not present

## 2019-05-04 DIAGNOSIS — Z08 Encounter for follow-up examination after completed treatment for malignant neoplasm: Secondary | ICD-10-CM

## 2019-05-04 DIAGNOSIS — Z9221 Personal history of antineoplastic chemotherapy: Secondary | ICD-10-CM | POA: Diagnosis not present

## 2019-05-04 LAB — COMPREHENSIVE METABOLIC PANEL
ALT: 26 U/L (ref 0–44)
AST: 29 U/L (ref 15–41)
Albumin: 3.7 g/dL (ref 3.5–5.0)
Alkaline Phosphatase: 78 U/L (ref 38–126)
Anion gap: 6 (ref 5–15)
BUN: 35 mg/dL — ABNORMAL HIGH (ref 8–23)
CO2: 26 mmol/L (ref 22–32)
Calcium: 9.3 mg/dL (ref 8.9–10.3)
Chloride: 105 mmol/L (ref 98–111)
Creatinine, Ser: 0.82 mg/dL (ref 0.44–1.00)
GFR calc Af Amer: >60 mL/min (ref >60)
GFR calc non Af Amer: >60 mL/min (ref >60)
Glucose, Bld: 128 mg/dL — ABNORMAL HIGH (ref 70–99)
Potassium: 4 mmol/L (ref 3.5–5.1)
Sodium: 137 mmol/L (ref 135–145)
Total Bilirubin: 0.7 mg/dL (ref 0.3–1.2)
Total Protein: 7.2 g/dL (ref 6.5–8.1)

## 2019-05-04 LAB — CBC WITH DIFFERENTIAL/PLATELET
Abs Immature Granulocytes: 0.02 10*3/uL (ref 0.00–0.07)
Basophils Absolute: 0.1 10*3/uL (ref 0.0–0.1)
Basophils Relative: 1 %
Eosinophils Absolute: 0.3 10*3/uL (ref 0.0–0.5)
Eosinophils Relative: 4 %
HCT: 37.7 % (ref 36.0–46.0)
Hemoglobin: 12.4 g/dL (ref 12.0–15.0)
Immature Granulocytes: 0 %
Lymphocytes Relative: 30 %
Lymphs Abs: 2.1 10*3/uL (ref 0.7–4.0)
MCH: 29.5 pg (ref 26.0–34.0)
MCHC: 32.9 g/dL (ref 30.0–36.0)
MCV: 89.8 fL (ref 80.0–100.0)
Monocytes Absolute: 0.8 10*3/uL (ref 0.1–1.0)
Monocytes Relative: 11 %
Neutro Abs: 3.7 10*3/uL (ref 1.7–7.7)
Neutrophils Relative %: 54 %
Platelets: 227 10*3/uL (ref 150–400)
RBC: 4.2 MIL/uL (ref 3.87–5.11)
RDW: 13.5 % (ref 11.5–15.5)
WBC: 6.9 10*3/uL (ref 4.0–10.5)
nRBC: 0 % (ref 0.0–0.2)

## 2019-05-04 MED ORDER — HEPARIN SOD (PORK) LOCK FLUSH 100 UNIT/ML IV SOLN
500.0000 [IU] | Freq: Once | INTRAVENOUS | Status: AC
  Administered 2019-05-04: 12:00:00 500 [IU] via INTRAVENOUS
  Filled 2019-05-04: qty 5

## 2019-05-04 MED ORDER — SODIUM CHLORIDE 0.9% FLUSH
10.0000 mL | Freq: Once | INTRAVENOUS | Status: AC
  Administered 2019-05-04: 10 mL via INTRAVENOUS
  Filled 2019-05-04: qty 10

## 2019-05-05 LAB — CA 125: Cancer Antigen (CA) 125: 14.6 U/mL (ref 0.0–38.1)

## 2019-05-07 DIAGNOSIS — Z9221 Personal history of antineoplastic chemotherapy: Secondary | ICD-10-CM | POA: Diagnosis not present

## 2019-05-07 DIAGNOSIS — Z90710 Acquired absence of both cervix and uterus: Secondary | ICD-10-CM | POA: Diagnosis not present

## 2019-05-07 DIAGNOSIS — Z90722 Acquired absence of ovaries, bilateral: Secondary | ICD-10-CM | POA: Diagnosis not present

## 2019-05-07 DIAGNOSIS — N131 Hydronephrosis with ureteral stricture, not elsewhere classified: Secondary | ICD-10-CM | POA: Diagnosis not present

## 2019-05-07 DIAGNOSIS — Z8543 Personal history of malignant neoplasm of ovary: Secondary | ICD-10-CM | POA: Diagnosis not present

## 2019-06-02 DIAGNOSIS — I442 Atrioventricular block, complete: Secondary | ICD-10-CM | POA: Diagnosis not present

## 2019-06-08 DIAGNOSIS — E46 Unspecified protein-calorie malnutrition: Secondary | ICD-10-CM | POA: Diagnosis not present

## 2019-06-08 DIAGNOSIS — Z8543 Personal history of malignant neoplasm of ovary: Secondary | ICD-10-CM | POA: Diagnosis not present

## 2019-06-08 DIAGNOSIS — Z0000 Encounter for general adult medical examination without abnormal findings: Secondary | ICD-10-CM | POA: Diagnosis not present

## 2019-06-08 DIAGNOSIS — Z950 Presence of cardiac pacemaker: Secondary | ICD-10-CM | POA: Diagnosis not present

## 2019-06-08 DIAGNOSIS — R0982 Postnasal drip: Secondary | ICD-10-CM | POA: Diagnosis not present

## 2019-06-08 DIAGNOSIS — E785 Hyperlipidemia, unspecified: Secondary | ICD-10-CM | POA: Diagnosis not present

## 2019-07-15 ENCOUNTER — Inpatient Hospital Stay: Payer: Medicare HMO

## 2019-07-15 NOTE — Progress Notes (Deleted)
[place]    Patient Care Team: Rusty Aus, MD as PCP - General (Internal Medicine) Clent Jacks, RN as Registered Nurse   Name of the patient: Natalie Stone  MW:[id number]  04-28-1926   Date of visit: 07/15/2019  Diagnosis:   Chief Complaint: Recurrent high grade ovarian CA, 6 month surveillance   Current Treatment: Weekly Carb/Taxol- Dr. Rao/ FILGRASTIM (daily)- Dr. Janese Banks   HPI:  Natalie Stone is a 86 y.o. female with advanced ovarian cancer treated in 2013 and recurrence diagnosed 5/20 now s/p 6 cycles of chemotherapy with normalization of CA125, who returns to clinic today for reevaluation.  She initiated weekly carbo AUC 2 on 07/28/2018 for recurrent disease.  For week 4, Taxol 60 mg/m was added.  She completed Day 8, Cycle 6 on 12/05/2018.   CA 125 has been followed in interm:  07/28/2018          1,066 08/04/2018        1,326.0 08/11/2018        1066 08/25/2018          623 09/19/2018        271 09/15/2018        86 09/29/2018        37.1 10/13/2018        20.8 10/30/2018        17.4 11/14/2018        14.7 11/21/2018        14.0 12/12/2018      13.6  01/14/2019    15.0 03/04/2019      13.2 05/04/2019       14.6    She continues to have some fatigue and weakness since chemotherapy. D15, Cycle 6 was held due to her symptoms. She had some anemia and leukopenia. She discussed surveillance imaging and monitoring of her CA 125 with Dr. Janese Banks.   Left ureteral stent changed by Dr Kandee Keen 01/08/19 and retrograde showed persistent stricture of distal ureter.  Plan to change again in 4-5 months.   Recent UTI treated with antibiotics.    She has significant fatigue and mild anemia, neutropenia with chemo.   CT ABD/PEL 03/04/2019 IMPRESSION: 1. Interval decrease in size of a mass in the low right hemipelvis adjacent to the rectum measuring approximately 2.3 x 1.0 cm, previously 3.4 x 1.8 cm when measured similarly (series 2, image 92).  Findings are consistent with treatment response. 2. No evidence of residual peritoneal or metastatic disease in the abdomen or pelvis. No evidence of metastatic disease in the chest. 3. Stable, low lying position of left-sided ureteral stent, with unchanged moderate left hydronephrosis and proximal hydroureter. 4. Large burden of stool throughout the colon. 5. Coronary artery disease.  Aortic Atherosclerosis (ICD10-I70.0).   CT CHEST 03/04/2019 IMPRESSION: 1. Interval decrease in size of a mass in the low right hemipelvis adjacent to the rectum measuring approximately 2.3 x 1.0 cm, previously 3.4 x 1.8 cm when measured similarly (series 2, image 92). Findings are consistent with treatment response. 2. No evidence of residual peritoneal or metastatic disease in the abdomen or pelvis. No evidence of metastatic disease in the chest. 3. Stable, low lying position of left-sided ureteral stent, with unchanged moderate left hydronephrosis and proximal hydroureter. 4. Large burden of stool throughout the colon. 5. Coronary artery disease.  Aortic Atherosclerosis (ICD10-I70.0).    Oncology History Ovarian cancer- stage III per patient [no records available]. Status post neoadjuvant chemotherapy followed by surgery with Dr  Stepahanie Yap in [place] in 2013; followed by adjuvant chemotherapy. Clinically no concerns for recurrence at this time.  She said she had negative genetic testing.  Establish care at [facility] in 2017 since she moved back to this area to be near family.  She is grandmother of Dr. Benjaman Kindler.   CT scan 11/11/18    IMPRESSION: 1. Stable bilobed cystic lesion in the right pelvis. 2. Resolution of ascites. 3. Two small enhancing nodules in the left abdomen adjacent to the ascending colon could be small stable peritoneal nodules or diverticuli. 4. No omental or peritoneal surface lesions are identified elsewhere. 5. No findings for metastatic disease involving the chest. 6.  Stable advanced atherosclerotic vascular calcifications in the chest, abdomen and pelvis. 7. Stable left-sided double-J ureteral stent with minimal residual left-sided hydronephrosis.   CA125 3/18 =13.7  02/05/2017- CT C/A/P IMPRESSION: 1. 14 x 13 mm low-density oval mass in the right retrocrural location at the level of the aortic hiatus. This may reflect a mildly enlarged retrocrural lymph node. This could potentially reflect metastatic adenopathy. It could reflect a small lymphangiocele or chronic reactive lymph node. If there are any previous CT exams available for comparison, these could help exclude metastatic disease. If not, follow-up chest CT in 2-3 months to reassess for stability would be recommended. 2. No other findings to suggest metastatic disease. No evidence of locally recurrent ovarian carcinoma. Status post hysterectomy. 3. No acute findings. 4. Coronary artery calcifications. Aortic atherosclerosis. 5. Mild increased colonic stool burden.  CA125 1/19 = 56.5  05/01/2017- CT C/A/P IMPRESSION: -Status post hysterectomy and suspected bilateral salpingo-oophorectomy. - No findings specific for recurrent or metastatic disease.  - Benign cystic lesion in the right retrocrural space is likely related to the thoracic duct. This is not considered suspicious for a lymph node. - 4 mm subpleural node in the posterior right middle lobe, new, nonspecific. This appearance is favored to be infectious/inflammatory. However, consider follow-up CT chest in 6 months.  CA125 5/19 = 174.9   06/27/17 CT C/A/P IMPRESSION: 1. There is a small amount of complex fluid within the pelvis with peripheral peritoneal enhancement. Findings are indeterminate but concerning for the possibility of recurrent disease. 2. Additionally within the pelvis there is a small hyperdense nodule which likely represents a hyperdense diverticulum as opposed to a peritoneal nodule as no definite additional  peritoneal nodules are identified.  Recommend attention on follow-up.  3. Slight interval decrease in size of right middle lobe nodule, likely resolving infectious/inflammatory process. Recommend attention on follow-up.  Reviewed the May 2019 CT scan with radiology and there is nothing that is amenable to biopsy.   CA-125 6/19-    210.2  Mild SUI.  She had bladder repair many years ago for SUI after having children.  Has been treated with anticholenergics in past, but mouth was very dry and had problems talking so stopped the medication.  Now uses Mirabegron 50 mg qd (a B-agonist) for urge incontinence per Dr Buel Ream suggestion.  No symptoms since she started this medication.   Since 10/18 CA125 has risen progressively from 33 to >1300.  She has had multiple negative scans of the chest/abd/pelvis.  In 5/20 she was admitted to [facility] and diagnosed with pelvic recurrence of her cancer and decision made to treat with Carboplain/Taxol.     Last screening colonoscopy 2013.   Oncology History:  Oncology History Overview Note  # NOV 2013-RIGHT OVARIAN CANCER-HIGH GRADE SEROUS CA 125-996 STAGE III [[facility]Jan 2014- neo-adj chemo x3 M;April 2014 TAH & BSO ; chemo x 41M [finished C-6; July 2014]. Ca 125 -13; N  # GENETIC TESTING-"OncoGene Dx"-NEG  # May-June 2020- Recurrent adenocarcinoma [ascites s/p para; [facility]] [id number].   # June 8th 2020- Carbo AUC 2  # may 2020- Left hydro-ureteral -n ephrosis [s/p stenting]; diarrhea-? malignancy s/p sigmoidscopy  [[facility]]  CT- showed severe left hydro ureteral nephrosis.  Sigmoid wall thickening differential includes carcinomatosis and colitis.  She did had a repeat scan on 07/09/2018 which showed interval increase in ascites mostly within the pelvis with associated enhancement of the peritoneum suggesting carcinomatosis.  Redemonstration of sigmoid wall thickening which may represent colitis versus carcinomatosis.  Delayed  left nephrogram with severe left hydro-ureteronephrosis similar to prior.   Given findings of colitis as well as ongoing symptoms of melena and diarrhea patient underwent EGD as well as flexible sigmoidoscopy which showed Schatzki's ring and diverticulosis but no evidence of tumor.  Her symptoms are possibly secondary to carcinomatosis.  She also had other stool studies done including C. difficile and stool cultures which were negative.Patient did undergo a diagnostic tap of her ascites on 07/09/2018 which was consistent with adenocarcinoma  S/p pacemaker [Dr.Paraschos] --------------------------------------   DIAGNOSIS: recurrent ovarian cancer  STAGE: IV; GOALS: palliative  CURRENT/MOST RECENT THERAPY: weekly carbo-Taxol     Malignant neoplasm of ovary ([place])  11/23/2015 Initial Diagnosis   Ovarian cancer, unspecified laterality ([place])   07/28/2018 -  Chemotherapy   The patient had palonosetron (ALOXI) injection 0.25 mg, 0.25 mg, Intravenous,  Once, 6 of 6 cycles Administration: 0.25 mg (07/28/2018), 0.25 mg (08/04/2018), 0.25 mg (08/11/2018), 0.25 mg (08/18/2018), 0.25 mg (08/25/2018), 0.25 mg (09/01/2018), 0.25 mg (09/15/2018), 0.25 mg (09/29/2018), 0.25 mg (10/13/2018), 0.25 mg (10/30/2018), 0.25 mg (11/14/2018), 0.25 mg (11/21/2018), 0.25 mg (12/05/2018), 0.25 mg (09/08/2018), 0.25 mg (10/06/2018), 0.25 mg (11/06/2018), 0.25 mg (11/28/2018) CARBOplatin (PARAPLATIN) 110 mg in sodium chloride 0.9 % 100 mL chemo infusion, 110 mg (100 % of original dose 113.4 mg), Intravenous,  Once, 6 of 6 cycles Dose modification:   (original dose 113.4 mg, Cycle 1) Administration: 110 mg (07/28/2018), 110 mg (08/04/2018), 110 mg (08/11/2018), 110 mg (08/18/2018), 110 mg (08/25/2018), 90 mg (09/01/2018), 90 mg (09/15/2018), 90 mg (09/29/2018), 90 mg (10/13/2018), 90 mg (10/30/2018), 80 mg (11/14/2018), 80 mg (11/21/2018), 80 mg (12/05/2018), 90 mg (09/08/2018), 90 mg (10/06/2018), 80 mg (11/06/2018), 80 mg (11/28/2018) PACLitaxel (TAXOL) 84 mg in  sodium chloride 0.9 % 250 mL chemo infusion (</= 80mg /m2), 60 mg/m2 = 84 mg (100 % of original dose 60 mg/m2), Intravenous,  Once, 4 of 4 cycles Dose modification: 60 mg/m2 (original dose 60 mg/m2, Cycle 3, Reason: Other (see comments), Comment: modify rate) Administration: 84 mg (09/08/2018), 84 mg (09/15/2018), 84 mg (09/29/2018), 84 mg (10/06/2018), 84 mg (10/13/2018), 84 mg (10/30/2018), 84 mg (11/14/2018), 84 mg (11/21/2018), 84 mg (12/05/2018), 84 mg (11/06/2018), 84 mg (11/28/2018) PACLitaxel (TAXOL) 84 mg in sodium chloride 0.9 % 250 mL chemo infusion (> 80mg /m2), 60 mg/m2 = 84 mg (100 % of original dose 60 mg/m2), Intravenous,  Once, 2 of 2 cycles Dose modification: 60 mg/m2 (original dose 60 mg/m2, Cycle 1, Reason: Other (see comments), Comment: weekly) Administration: 84 mg (08/18/2018), 84 mg (08/25/2018), 84 mg (09/01/2018) fosaprepitant (EMEND) 150 mg, dexamethasone (DECADRON) 12 mg in sodium chloride 0.9 % 145 mL IVPB, , Intravenous,  Once, 4 of 4 cycles Administration:  (07/28/2018),  (08/04/2018),  (08/11/2018),  (08/18/2018),  (08/25/2018),  (09/01/2018),  (09/15/2018),  (  09/29/2018),  (10/13/2018),  (09/08/2018),  (10/06/2018)  for chemotherapy treatment.      Subjective Data:  ECOG: {CHL ONC [id number]   ROS:    Past Medical History:  Diagnosis Date  . CAD (coronary artery disease)   . Chronic kidney disease    urinary incontinence  . Hyperthyroidism   . Hypothyroidism (acquired)   . Ovarian cancer ([place]) 2014  . Shingles   . Third degree AV block ([place]) 10/09/2015    Past Surgical History:  Procedure Laterality Date  . ABDOMINAL HYSTERECTOMY    . BLADDER REPAIR    . CORONARY ANGIOPLASTY WITH STENT PLACEMENT  2001   Dr. Isaiah Blakes in [place]  . CYSTOURETHROSCOPY, WITH INSERTION OF INDWELLING URETERAL STENT (EG,[place]BONS OR DOUBLE-J TYPE) (Left Ureter Left   . PACEMAKER INSERTION N/A 10/11/2015   Procedure: INSERTION PACEMAKER;  Surgeon: [name], MD;  Location: [facility] ORS;   Service: Cardiovascular;  Laterality: N/A;  . PARTIAL HYSTERECTOMY    . PORTA CATH INSERTION N/A 07/24/2018   Procedure: PORTA CATH INSERTION;  Surgeon: Algernon Huxley, MD;  Location: [place] [facility];  Service: Cardiovascular;  Laterality: N/A;    Social History   Socioeconomic History  . Marital status: Widowed    Spouse name: Not on file  . Number of children: Not on file  . Years of education: Not on file  . Highest education level: Not on file  Occupational History  . Not on file  Tobacco Use  . Smoking status: Never Smoker  . Smokeless tobacco: Never Used  Substance and Sexual Activity  . Alcohol use: No  . Drug use: No  . Sexual activity: Never  Other Topics Concern  . 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 Difficulty of Paying Living Expenses:   Food Insecurity:   . Worried About Charity fundraiser in the Last Year:   . Arboriculturist in the Last Year:   Transportation Needs:   . Film/video editor (Medical):   Marland Kitchen Lack of Transportation (Non-Medical):   Physical Activity:   . Days of Exercise per Week:   . Minutes of Exercise per Session:   Stress:   . Feeling of Stress :   Social Connections:   . Frequency of Communication with Friends and Family:   . Frequency of Social Gatherings with Friends and Family:   . Attends Religious Services:   . Active Member of Clubs or Organizations:   . Attends Archivist Meetings:   Marland Kitchen Marital Status:   Intimate Partner Violence:   . Fear of Current or Ex-Partner:   . Emotionally Abused:   Marland Kitchen Physically Abused:   . Sexually Abused:     Objective Data:      EXAM      Assessment & Plan:   RECURRENT HIGH GRADE OVARIAN CA,

## 2019-07-16 DIAGNOSIS — R300 Dysuria: Secondary | ICD-10-CM | POA: Diagnosis not present

## 2019-08-04 ENCOUNTER — Ambulatory Visit
Admission: RE | Admit: 2019-08-04 | Discharge: 2019-08-04 | Disposition: A | Discharge status: Home / Self Care | Payer: Medicare HMO | Source: Ambulatory Visit | Attending: Oncology | Admitting: Oncology

## 2019-08-04 ENCOUNTER — Inpatient Hospital Stay: Payer: Medicare HMO | Attending: Oncology

## 2019-08-04 ENCOUNTER — Other Ambulatory Visit: Payer: Self-pay

## 2019-08-04 DIAGNOSIS — Z950 Presence of cardiac pacemaker: Secondary | ICD-10-CM | POA: Insufficient documentation

## 2019-08-04 DIAGNOSIS — Z90710 Acquired absence of both cervix and uterus: Secondary | ICD-10-CM | POA: Diagnosis not present

## 2019-08-04 DIAGNOSIS — I700 Atherosclerosis of aorta: Secondary | ICD-10-CM | POA: Insufficient documentation

## 2019-08-04 DIAGNOSIS — Z79899 Other long term (current) drug therapy: Secondary | ICD-10-CM | POA: Insufficient documentation

## 2019-08-04 DIAGNOSIS — C569 Malignant neoplasm of unspecified ovary: Secondary | ICD-10-CM | POA: Insufficient documentation

## 2019-08-04 DIAGNOSIS — Z8543 Personal history of malignant neoplasm of ovary: Secondary | ICD-10-CM

## 2019-08-04 DIAGNOSIS — N189 Chronic kidney disease, unspecified: Secondary | ICD-10-CM | POA: Diagnosis not present

## 2019-08-04 DIAGNOSIS — Z9221 Personal history of antineoplastic chemotherapy: Secondary | ICD-10-CM | POA: Diagnosis not present

## 2019-08-04 DIAGNOSIS — C787 Secondary malignant neoplasm of liver and intrahepatic bile duct: Secondary | ICD-10-CM | POA: Diagnosis not present

## 2019-08-04 DIAGNOSIS — Z955 Presence of coronary angioplasty implant and graft: Secondary | ICD-10-CM | POA: Insufficient documentation

## 2019-08-04 DIAGNOSIS — E039 Hypothyroidism, unspecified: Secondary | ICD-10-CM | POA: Diagnosis not present

## 2019-08-04 DIAGNOSIS — J90 Pleural effusion, not elsewhere classified: Secondary | ICD-10-CM | POA: Insufficient documentation

## 2019-08-04 DIAGNOSIS — N1330 Unspecified hydronephrosis: Secondary | ICD-10-CM | POA: Diagnosis not present

## 2019-08-04 DIAGNOSIS — Z7982 Long term (current) use of aspirin: Secondary | ICD-10-CM | POA: Insufficient documentation

## 2019-08-04 DIAGNOSIS — Z90722 Acquired absence of ovaries, bilateral: Secondary | ICD-10-CM | POA: Diagnosis not present

## 2019-08-04 DIAGNOSIS — N3941 Urge incontinence: Secondary | ICD-10-CM | POA: Insufficient documentation

## 2019-08-04 DIAGNOSIS — Z87440 Personal history of urinary (tract) infections: Secondary | ICD-10-CM | POA: Diagnosis not present

## 2019-08-04 DIAGNOSIS — I2510 Atherosclerotic heart disease of native coronary artery without angina pectoris: Secondary | ICD-10-CM | POA: Diagnosis not present

## 2019-08-04 DIAGNOSIS — R971 Elevated cancer antigen 125 [CA 125]: Secondary | ICD-10-CM | POA: Diagnosis not present

## 2019-08-04 DIAGNOSIS — Z08 Encounter for follow-up examination after completed treatment for malignant neoplasm: Secondary | ICD-10-CM | POA: Diagnosis not present

## 2019-08-04 DIAGNOSIS — Z95828 Presence of other vascular implants and grafts: Secondary | ICD-10-CM

## 2019-08-04 LAB — COMPREHENSIVE METABOLIC PANEL
ALT: 17 U/L (ref 0–44)
AST: 24 U/L (ref 15–41)
Albumin: 3.6 g/dL (ref 3.5–5.0)
Alkaline Phosphatase: 84 U/L (ref 38–126)
Anion gap: 9 (ref 5–15)
BUN: 20 mg/dL (ref 8–23)
CO2: 29 mmol/L (ref 22–32)
Calcium: 9.4 mg/dL (ref 8.9–10.3)
Chloride: 101 mmol/L (ref 98–111)
Creatinine, Ser: 0.85 mg/dL (ref 0.44–1.00)
GFR calc Af Amer: >60 mL/min (ref >60)
GFR calc non Af Amer: >60 mL/min (ref >60)
Glucose, Bld: 109 mg/dL — ABNORMAL HIGH (ref 70–99)
Potassium: 4.2 mmol/L (ref 3.5–5.1)
Sodium: 139 mmol/L (ref 135–145)
Total Bilirubin: 0.6 mg/dL (ref 0.3–1.2)
Total Protein: 7.4 g/dL (ref 6.5–8.1)

## 2019-08-04 LAB — CBC WITH DIFFERENTIAL/PLATELET
Abs Immature Granulocytes: 0.02 10*3/uL (ref 0.00–0.07)
Basophils Absolute: 0 10*3/uL (ref 0.0–0.1)
Basophils Relative: 1 %
Eosinophils Absolute: 0.2 10*3/uL (ref 0.0–0.5)
Eosinophils Relative: 3 %
HCT: 37.2 % (ref 36.0–46.0)
Hemoglobin: 12 g/dL (ref 12.0–15.0)
Immature Granulocytes: 0 %
Lymphocytes Relative: 39 %
Lymphs Abs: 2.7 10*3/uL (ref 0.7–4.0)
MCH: 29.3 pg (ref 26.0–34.0)
MCHC: 32.3 g/dL (ref 30.0–36.0)
MCV: 90.7 fL (ref 80.0–100.0)
Monocytes Absolute: 0.9 10*3/uL (ref 0.1–1.0)
Monocytes Relative: 13 %
Neutro Abs: 3.1 10*3/uL (ref 1.7–7.7)
Neutrophils Relative %: 44 %
Platelets: 238 10*3/uL (ref 150–400)
RBC: 4.1 MIL/uL (ref 3.87–5.11)
RDW: 13 % (ref 11.5–15.5)
WBC: 7 10*3/uL (ref 4.0–10.5)
nRBC: 0 % (ref 0.0–0.2)

## 2019-08-04 MED ORDER — HEPARIN SOD (PORK) LOCK FLUSH 100 UNIT/ML IV SOLN
500.0000 [IU] | Freq: Once | INTRAVENOUS | Status: AC
  Administered 2019-08-04: 500 [IU] via INTRAVENOUS
  Filled 2019-08-04: qty 5

## 2019-08-04 MED ORDER — SODIUM CHLORIDE 0.9% FLUSH
10.0000 mL | INTRAVENOUS | Status: DC | PRN
  Administered 2019-08-04: 10 mL via INTRAVENOUS
  Filled 2019-08-04: qty 10

## 2019-08-04 MED ORDER — IOHEXOL 300 MG/ML  SOLN
75.0000 mL | Freq: Once | INTRAMUSCULAR | Status: AC | PRN
  Administered 2019-08-04: 75 mL via INTRAVENOUS

## 2019-08-05 DIAGNOSIS — H9193 Unspecified hearing loss, bilateral: Secondary | ICD-10-CM | POA: Diagnosis not present

## 2019-08-05 LAB — CA 125: Cancer Antigen (CA) 125: 96.7 U/mL — ABNORMAL HIGH (ref 0.0–38.1)

## 2019-08-07 ENCOUNTER — Inpatient Hospital Stay (HOSPITAL_BASED_OUTPATIENT_CLINIC_OR_DEPARTMENT_OTHER): Payer: Medicare HMO | Admitting: Oncology

## 2019-08-07 ENCOUNTER — Telehealth: Payer: Self-pay | Admitting: *Deleted

## 2019-08-07 ENCOUNTER — Telehealth: Payer: Self-pay

## 2019-08-07 ENCOUNTER — Other Ambulatory Visit: Payer: Self-pay | Admitting: *Deleted

## 2019-08-07 ENCOUNTER — Encounter: Payer: Self-pay | Admitting: Oncology

## 2019-08-07 DIAGNOSIS — Z79899 Other long term (current) drug therapy: Secondary | ICD-10-CM | POA: Diagnosis not present

## 2019-08-07 DIAGNOSIS — Z7189 Other specified counseling: Secondary | ICD-10-CM

## 2019-08-07 DIAGNOSIS — C569 Malignant neoplasm of unspecified ovary: Secondary | ICD-10-CM

## 2019-08-07 NOTE — Telephone Encounter (Signed)
Natalie Stone did say that her mother told her that the pt was going on vacation on august 1 to go to beach with her daughter, grandkids and great grand kid and she did not want chemo to be started until after that. She said to [facility] that she wanted to feel the best with all the family that she has not seen in several years and have a good time. She will start treatment after she gets back. I will pass the info to Dr. Janese Banks but pt has the choice to do what she would like to do. Just a reminder that it does mean that the cancer can grow during this time away but totally understand that pt wasn't to enjoy her family on vacation and not have side effects from chemo.

## 2019-08-07 NOTE — Telephone Encounter (Signed)
Called and spoke to daughter [name] and told her that we could not get echo in a timely matter; so we switched to muga instead and it is 8/24 at 10:30 . They will still come to see GYN on 6/23 at 10:30 but the liquid bx kit I wanted her to come early on gyn day at 10 am to get the lab done. Dr. Janese Banks wants to see pt on Friday to go over muga scan and proceed with treatment plan. I told [name] if she will come in 10 am on wed. And get blood work then I will give her print out for the rest of that week and she is ok with this because when I called her she was watery flowers outside and did not have anything to write down, but she has my chart also

## 2019-08-07 NOTE — Progress Notes (Signed)
Called pt and she had another uti and cipro did not work and got a new one ane just finished it and she is feeling better.

## 2019-08-07 NOTE — Telephone Encounter (Signed)
[employer] CDx has been requested on specimen [id number], collected 06/18/2012, right tube and ovary. Specimen location: [facility] department of pathology and clinical laboratories, [place]. Copy of requisition sent to HIM for scanning.

## 2019-08-11 NOTE — Progress Notes (Signed)
[facility]  [phone] [phone]  Patient Care Team: Rusty Aus, MD as PCP - General (Internal Medicine) Clent Jacks, RN as Registered Nurse   Name of the patient: Natalie Stone  [id number]  1926-06-22   Date of visit: 08/12/2019   Gynecologic Oncology Interval Visit   Referring Provider: Dr. Janese Banks  Chief Concern: recurrent high grade ovarian cancer   Subjective:  Natalie Stone is a 86 y.o. female with advanced ovarian cancer treated in 2013 and recurrence diagnosed 5/20 now s/p 18 cycles of weekly chemotherapy with normalization of CA125, who returns to clinic today for follow-up.   Today, she returns with concern for recurrent disease.  Her CA125 drawn on 08/04/2019 is elevated at 96.7. Additionally, the CT scan shows new liver lesions, worrisome for metastases. She has dicussed both of these findings with Dr. Janese Banks, but desires to delay treatment until after her planned beach vacation.   CT C/A/P 08/04/2019 CT CHEST, ABDOMEN, AND PELVIS WITH CONTRAST IMPRESSION: 10 x 17 mm serosal implant along the right recto sigmoid mesocolon,  stable versus mildly improved.  New liver lesions measuring up to 16 mm in segment 5, worrisome for  metastases. Follow-up is suggested in 3-6 months.  Left double-pigtail ureteral stent in satisfactory position.  Trace right pleural effusion, new.    Reports bladder leakage. No significant new abdominal pain or GI issues.   Oncology History  Ovarian cancer- stage III per patient [no records available]. Status post neoadjuvant chemotherapy followed by surgery with Dr Eppie Gibson in [place] in 2013; followed by adjuvant chemotherapy. Clinically no concerns for recurrence at this time.  She said she had negative genetic testing.  Establish care at [facility] in 2017 since she moved back to this area to be near family.  She is grandmother of Dr. Benjaman Kindler.  CA125 3/18  =13.7  02/05/2017- CT C/A/P IMPRESSION: 1. 14 x 13 mm low-density oval mass in the right retrocrural location at the level of the aortic hiatus. This may reflect a mildly enlarged retrocrural lymph node. This could potentially reflect metastatic adenopathy. It could reflect a small lymphangiocele or chronic reactive lymph node. If there are any previous CT exams available for comparison, these could help exclude metastatic disease. If not, follow-up chest CT in 2-3 months to reassess for stability would be recommended. 2. No other findings to suggest metastatic disease. No evidence of locally recurrent ovarian carcinoma. Status post hysterectomy. 3. No acute findings. 4. Coronary artery calcifications.  Aortic atherosclerosis. 5. Mild increased colonic stool burden.  CA125 01/19 = 56.5  05/01/2017- CT C/A/P IMPRESSION: -Status post hysterectomy and suspected bilateral salpingo-oophorectomy. - No findings specific for recurrent or metastatic disease.  - Benign cystic lesion in the right retrocrural space is likely related to the thoracic duct. This is not considered suspicious for a lymph node. - 4 mm subpleural node in the posterior right middle lobe, new, nonspecific. This appearance is favored to be infectious/inflammatory. However, consider follow-up CT chest in 6 months.  CA125 05/19 = 174.9   06/27/17 CT C/A/P IMPRESSION: 1. There is a small amount of complex fluid within the pelvis with peripheral peritoneal enhancement. Findings are indeterminate but concerning for the possibility of recurrent disease. 2. Additionally within the pelvis there is a small hyperdense nodule which likely represents a hyperdense diverticulum as opposed to a peritoneal nodule as no definite additional peritoneal nodules are identified.  Recommend attention on follow-up.  3. Slight interval  decrease in size of right middle lobe nodule, likely resolving infectious/inflammatory process. Recommend attention on  follow-up.  Reviewed the May 2019 CT scan with radiology and there is nothing that is amenable to biopsy.   CA-125 06/19-  210.2  Mild SUI.  She had bladder repair many years ago for SUI after having children.  Has been treated with anticholenergics in past, but mouth was very dry and had problems talking so stopped the medication.  Now uses Mirabegron 50 mg qd (a B-agonist) for urge incontinence per Dr Buel Ream suggestion.  No symptoms since she started this medication.   Since 10/18 CA125 has risen progressively from 33 to >1300.  She has had multiple negative scans of the chest/abd/pelvis.  In 5/20 she was admitted to [facility] and diagnosed with pelvic recurrence of her cancer and decision made to treat with Carboplain/Taxol.     Last screening colonoscopy 2013.    In 5/19 the Invitae 83 gene panel was negative for pathogenic mutations. Had one VUS in the MET gene.   CT chest 10/30/17 showed no findings suspicious for metastatic disease. Solitary 34m RML nodule slightly decreased & though to be benign.   CT abd/pelvis 11/19 was negative for acute findings or recurrent tumor.   Seen by Dr. BFransisca Connorson 02/05/2018.  NED and asymptomatic at that time.  Normal CT scan and normal MRI of brain.  [facility] of [facility] pain, fatigue, nausea, poor appetite, and weight loss. She was found to have melena and admitted for concerns of recurrence of ovarian cancer. CA 125 was 1307 and CT demonstrated carcinomatosis and soft tissue pelvic mass, and hydronephrosis of left kidney. Biopsy was discussed but felt not to be feasible. Paracentesis was performed on 5/20 and cytology consistent with adenocarcinoma. She underwent left ureteral stent placement on 5/20. Given melena and diarrhea she also underwent flex sig and EGD which noted mild colonic edema, diverticulosis, and Schatski ring but no evidence of tumor burden or acute pathology. C diff, fecal leukocytes, stool O&P, stool  culture, fecal calprotectin, and h pylori were negative. Ultimately melena and diarrhea felt to be secondary to carcinomatosis. After extensive discussion of goals of care with patient and family, she elected to reinitiate chemotherapy.    She initiated weekly carbo AUC 2 on 07/28/2018 for recurrent disease.  For week 4, Taxol 60 mg/m was added.    CT scan 11/11/18-  1. Stable bilobed cystic lesion in the right pelvis. 2. Resolution of ascites. 3. Two small enhancing nodules in the left abdomen adjacent to the ascending colon could be small stable peritoneal nodules or diverticuli. 4. No omental or peritoneal surface lesions are identified elsewhere. 5. No findings for metastatic disease involving the chest. 6. Stable advanced atherosclerotic vascular calcifications in the chest, abdomen and pelvis. 7. Stable left-sided double-J ureteral stent with minimal residual left-sided hydronephrosis.  She completed 18 cycles of weekly dosing on 11/28/2018. D15, Cycle 6 was held due to fatigue and weakness. She also suffered anemia and leukopenia with treatment.  CA125 normalized.   Left ureteral stent changed by Dr PKandee Keen11/19/20 and retrograde showed persistent stricture of distal ureter.  Plan to change again in 4-5 months.   Recent UTI treated with antibiotics.    CT scan 01/21 Interval CT imaging on 03/04/19 showed interval decrease in size of mass in the low right hemipelvis adjacent to rectum measuring 2.3 x 1.0 (previously 3.4 x 1.8 cm on 11/11/2018 imaging) consistent with treatment response. No  evidence of residual peritoneal or metastatic disease elsewhere.   IMPRESSION: 1. Interval decrease in size of a mass in the low right hemipelvis adjacent to the rectum measuring approximately 2.3 x 1.0 cm, previously 3.4 x 1.8 cm when measured similarly (series 2, image 92). Findings are consistent with treatment response. 2. No evidence of residual peritoneal or metastatic disease in the abdomen or  pelvis. No evidence of metastatic disease in the chest. 3. Stable, low lying position of left-sided ureteral stent, with unchanged moderate left hydronephrosis and proximal hydroureter. 4. Large burden of stool throughout the colon. 5. Coronary artery disease.  Aortic Atherosclerosis (ICD10-I70.0).     Oncology History Overview Note  # NOV 2013-RIGHT OVARIAN CANCER-HIGH GRADE SEROUS CA 125-996 STAGE III [Dr[facility]phan[facility]Jan 2014- neo-adj chemo x3 M;April 2014 TAH & BSO ; chemo x 42M [finished C-6; July 2014]. Ca 125 -13; N  # GENETIC TESTING-"OncoGene Dx"-NEG  # May-June 2020- Recurrent adenocarcinoma [ascites s/p para; [facility]] ca-[phone].   # June 8th 2020- Carbo AUC 2  # may 2020- Left hydro-ureteral -n ephrosis [s/p stenting]; diarrhea-? malignancy s/p sigmoidscopy  [[facility]]  CT- showed severe left hydro ureteral nephrosis.  Sigmoid wall thickening differential includes carcinomatosis and colitis.  She did had a repeat scan on 07/09/2018 which showed interval increase in ascites mostly within the pelvis with associated enhancement of the peritoneum suggesting carcinomatosis.  Redemonstration of sigmoid wall thickening which may represent colitis versus carcinomatosis.  Delayed left nephrogram with severe left hydro-ureteronephrosis similar to prior.   Given findings of colitis as well as ongoing symptoms of melena and diarrhea patient underwent EGD as well as flexible sigmoidoscopy which showed Schatzki's ring and diverticulosis but no evidence of tumor.  Her symptoms are possibly secondary to carcinomatosis.  She also had other stool studies done including C. difficile and stool cultures which were negative.Patient did undergo a diagnostic tap of her ascites on 07/09/2018 which was consistent with adenocarcinoma  S/p pacemaker [Dr.Paraschos] --------------------------------------   DIAGNOSIS: recurrent ovarian cancer  STAGE: IV; GOALS: palliative  CURRENT/MOST  RECENT THERAPY: weekly carbo-Taxol     Malignant neoplasm of ovary ([place])  11/23/2015 Initial Diagnosis   Ovarian cancer, unspecified laterality ([place])   07/28/2018 -  Chemotherapy   The patient had palonosetron (ALOXI) injection 0.25 mg, 0.25 mg, Intravenous,  Once, 6 of 6 cycles Administration: 0.25 mg (07/28/2018), 0.25 mg (08/04/2018), 0.25 mg (08/11/2018), 0.25 mg (08/18/2018), 0.25 mg (08/25/2018), 0.25 mg (09/01/2018), 0.25 mg (09/15/2018), 0.25 mg (09/29/2018), 0.25 mg (10/13/2018), 0.25 mg (10/30/2018), 0.25 mg (11/14/2018), 0.25 mg (11/21/2018), 0.25 mg (12/05/2018), 0.25 mg (09/08/2018), 0.25 mg (10/06/2018), 0.25 mg (11/06/2018), 0.25 mg (11/28/2018) CARBOplatin (PARAPLATIN) 110 mg in sodium chloride 0.9 % 100 mL chemo infusion, 110 mg (100 % of original dose 113.4 mg), Intravenous,  Once, 6 of 6 cycles Dose modification:   (original dose 113.4 mg, Cycle 1) Administration: 110 mg (07/28/2018), 110 mg (08/04/2018), 110 mg (08/11/2018), 110 mg (08/18/2018), 110 mg (08/25/2018), 90 mg (09/01/2018), 90 mg (09/15/2018), 90 mg (09/29/2018), 90 mg (10/13/2018), 90 mg (10/30/2018), 80 mg (11/14/2018), 80 mg (11/21/2018), 80 mg (12/05/2018), 90 mg (09/08/2018), 90 mg (10/06/2018), 80 mg (11/06/2018), 80 mg (11/28/2018) PACLitaxel (TAXOL) 84 mg in sodium chloride 0.9 % 250 mL chemo infusion (</= 54m/m2), 60 mg/m2 = 84 mg (100 % of original dose 60 mg/m2), Intravenous,  Once, 4 of 4 cycles Dose modification: 60 mg/m2 (original dose 60 mg/m2, Cycle 3, Reason: Other (see comments), Comment: modify rate) Administration: 84  mg (09/08/2018), 84 mg (09/15/2018), 84 mg (09/29/2018), 84 mg (10/06/2018), 84 mg (10/13/2018), 84 mg (10/30/2018), 84 mg (11/14/2018), 84 mg (11/21/2018), 84 mg (12/05/2018), 84 mg (11/06/2018), 84 mg (11/28/2018) PACLitaxel (TAXOL) 84 mg in sodium chloride 0.9 % 250 mL chemo infusion (> 48m/m2), 60 mg/m2 = 84 mg (100 % of original dose 60 mg/m2), Intravenous,  Once, 2 of 2 cycles Dose modification: 60 mg/m2 (original dose 60  mg/m2, Cycle 1, Reason: Other (see comments), Comment: weekly) Administration: 84 mg (08/18/2018), 84 mg (08/25/2018), 84 mg (09/01/2018) fosaprepitant (EMEND) 150 mg, dexamethasone (DECADRON) 12 mg in sodium chloride 0.9 % 145 mL IVPB, , Intravenous,  Once, 4 of 4 cycles Administration:  (07/28/2018),  (08/04/2018),  (08/11/2018),  (08/18/2018),  (08/25/2018),  (09/01/2018),  (09/15/2018),  (09/29/2018),  (10/13/2018),  (09/08/2018),  (10/06/2018)  for chemotherapy treatment.      Problem List: Patient Active Problem List   Diagnosis Date Noted  . Chemotherapy induced neutropenia ([place] 09/01/2018  . Goals of care, counseling/discussion 07/21/2018  . CINV (chemotherapy-induced nausea and vomiting) 07/08/2018  . Diarrhea 07/06/2018  . Hydronephrosis of left kidney 07/06/2018  . Combined hyperlipidemia 02/07/2017  . Adult idiopathic generalized osteoporosis 11/05/2016  . Medicare annual wellness visit, initial 11/05/2016  . Malignant [place] of [place] ([place] 11/23/2015  . Acquired hypothyroidism 10/26/2015  . Aortic atherosclerosis ([place] 10/26/2015  . Cardiac pacemaker 10/26/2015  . Chemotherapy-induced neuropathy ([place] 10/26/2015  . Coronary artery disease involving native coronary artery of native heart without angina pectoris 10/26/2015  . Pacemaker pocket hematoma 10/26/2015  . Status post placement of cardiac pacemaker 10/26/2015  . Bradycardia 10/09/2015  . Third degree AV block ([place] 10/09/2015    Past Medical History: Past Medical History:  Diagnosis Date  . CAD (coronary artery disease)   . Chronic kidney disease    urinary incontinence  . Hyperthyroidism   . Hypothyroidism (acquired)   . [place]ian cancer ([place] 2014  . Shingles   . Third degree AV block ([place] 10/09/2015    Past Surgical History: Past Surgical History:  Procedure Laterality Date  . ABDOMINAL HYSTERECTOMY    . BLADDER REPAIR    . CORONARY ANGIOPLASTY WITH STENT PLACEMENT  2001   [place] .  CYSTOURETHROSCOPY, WITH INSERTION OF INDWELLING URETERAL STENT (EG,GIBBONS OR DOUBLE-J TYPE) (Left Ureter Left   . PACEMAKER INSERTION N/A 10/11/2015   Procedure: INSERTION PACEMAKER;  Surgeon: AIsaias Cowman MD;  Location: [facility] ORS;  Service: Cardiovascular;  Laterality: N/A;  . PARTIAL HYSTERECTOMY    . PORTA CATH INSERTION N/A 07/24/2018   Procedure: PORTA CATH INSERTION;  Surgeon: DAlgernon Huxley MD;  [facility];  Service: Cardiovascular;  Laterality: N/A;     Family History: Family History  Problem Relation Age of Onset  . Diabetes Brother     Social History: Social History   Socioeconomic History  . Marital status: Widowed    Spouse name: Not on file  . Number of children: Not on file  . Years of education: Not on file  . Highest education level: Not on file  Occupational History  . Not on file  Tobacco Use  . Smoking status: Never Smoker  . Smokeless tobacco: Never Used  Vaping Use  . Vaping Use: Never used  Substance and Sexual Activity  . Alcohol use: No  . Drug use: No  . Sexual activity: Never  Other Topics Concern  . 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 Difficulty of Paying Living Expenses:   Food Insecurity:   . Worried About Charity fundraiser in the Last Year:   . Arboriculturist in the Last Year:   Transportation Needs:   . Film/video editor (Medical):   Marland Kitchen Lack of Transportation (Non-Medical):   Physical Activity:   . Days of Exercise per Week:   . Minutes of Exercise per Session:   Stress:   . Feeling of Stress :   Social Connections:   . Frequency of Communication with Friends and Family:   . Frequency of Social Gatherings with Friends and Family:   . Attends Religious Services:   . Active Member of Clubs or Organizations:   . Attends Archivist Meetings:   Marland Kitchen  Marital Status:   Intimate Partner Violence:   . Fear of Current or Ex-Partner:   . Emotionally Abused:   Marland Kitchen Physically Abused:   . Sexually Abused:     Allergies: Allergies  Allergen Reactions  . Penicillins Anaphylaxis    Has patient had a PCN reaction causing immediate rash, facial/tongue/throat swelling, SOB or lightheadedness with hypotension: Yes Has patient had a PCN reaction causing severe rash involving mucus membranes or skin necrosis: No Has patient had a PCN reaction that required hospitalization No Has patient had a PCN reaction occurring within the last 10 years: No If all of the above answers are "NO", then may proceed with Cephalosporin use.    Current Medications: Current Outpatient Medications  Medication Sig Dispense Refill  . aspirin EC 81 MG tablet Take 81 mg by mouth daily.    Marland Kitchen atorvastatin (LIPITOR) 10 MG tablet Take 10 mg by mouth at bedtime.     Marland Kitchen b complex vitamins tablet Take 1 tablet by mouth daily. (Patient not taking: Reported on 08/07/2019)    . Bioflavonoid Products (ESTER C PO) Take 500 mg by mouth daily.     . Calcium Carbonate-Vitamin D (CALTRATE 600+D PO) Take 1 tablet by mouth daily.    . Cholecalciferol (VITAMIN D3) 1000 units CAPS Take 1,000 Units by mouth daily.     . Coenzyme Q10 (COQ10) 100 MG CAPS Take 100 mg by mouth daily.    . Digestive Enzymes (DIGESTIVE ENZYME PO) Take 1 Dose by mouth 3 (three) times daily before meals.    . Glucosamine-MSM-Hyaluronic Acd (JOINT HEALTH PO) Take 1 tablet by mouth 2 (two) times a day.     . hydrocortisone 2.5 % cream Apply 1 application topically daily as needed.     Marland Kitchen levothyroxine (SYNTHROID) 100 MCG tablet Take 100 mcg by mouth daily before breakfast.     . mirtazapine (REMERON) 7.5 MG tablet Take 7.5 mg by mouth at bedtime.    . ondansetron (ZOFRAN) 4 MG tablet TAKE 1 TABLET(4 MG) BY MOUTH EVERY 8 HOURS AS NEEDED FOR NAUSEA OR VOMITING (Patient not taking: Reported on 08/07/2019) 90 tablet 0   No  current facility-administered medications for this visit.   Review of Systems Review of Systems General: no complaints  HEENT: no complaints  Lungs: no complaints  Cardiac: no complaints  GI: Patient reports foreign body in umbilicus. She reports It has been present >6 months. She denies redness, irritation, pain, or drainage.  GU: no complaints  Musculoskeletal: no complaints  Extremities: no complaints  Skin: no complaints  Neuro: no complaints  Endocrine: no complaints  Psych: no complaints         Objective:  Physical Examination:  BP 124/61   Pulse 87   Temp 98.1 F (36.7 C) (Oral)   Resp 16   Ht 5' 1"  (1.549 m)   Wt 53.1 kg   BMI 22.11 kg/m    ECOG Performance Status: 2 - Symptomatic, <50% confined to bed  EXAM:  GENERAL: Patient is a well appearing female in no acute distress HEENT:  PERRL, neck supple with midline trachea. Thyroid without masses.  NODES:  No cervical, supraclavicular, axillary, or inguinal lymphadenopathy palpated.  LUNGS:  Clear to auscultation bilaterally.  No wheezes or rhonchi. HEART:  Regular rate and rhythm. No murmur appreciated. ABDOMEN:  Soft, nontender.  Positive, normoactive bowel sounds. Foreign body present in umbilicus. Unable to be removed.  MSK:  No focal spinal tenderness to palpation. Full range of motion bilaterally in the upper extremities. EXTREMITIES:  No peripheral edema.   SKIN:  Clear with no obvious rashes or skin changes. No nail dyscrasia. NEURO:  Nonfocal. Well oriented.  Appropriate affect.  PELVIC: EGBUS/Vagina atrophic, small skin fissure along right lower labia.  No infection.   Bimanual/RV: no masses.      Assessment:  Natalie Stone is a 86 y.o. female diagnosed with advanced ovarian cancer treated with neoadjuvant chemo and interval debulking in 2013 in [place].    Germline BRCA genetic testing normal.  In 5/19 the Invitae 83 gene panel was negative for pathogenic mutations.   Recurrent peritoneal  disease documented by paracentesis after CA-125 had been progressively rising for over a year with normal CT scans. Ureteral stent placed on left due to hydronephrosis. Started weekly carbo/taxol chemotherapy 6/20 and CA125 came down from 1300 to 13 in 10/20 after 6 cycles.  CT scan in January 2021 showed good response with residual 2 cm mass in pelvis and CA125 stable at 13. CA125 now 96 and CT scan shows new liver metastases. She is relatively asymptomatic.   Left ureteral stent changed by Dr Kandee Keen 01/08/19 and retrograde showed persistent stricture of distal ureter.  Plan to change again in 4-5 months.   She has had some occasional UTIs and bladder irritative symptoms.  CT scan 1/20 shows low lying stent.     No vulvar irritation at this time.   Urge incontinence s/p surgery for SUI in the past.  Now uses Mirabegron 50 mg qd (a B-agonist) for urge incontinence per Dr Buel Ream suggestion with good results and no symptoms.   Plan:   We will see her back for follow up the first week of August to discuss/start treatment with Dr. Janese Banks.  Considering Doxil at this time, as recommended by Dr. Janese Banks. Discussed that this is usually well tolerated and with minimal hair loss. Also discussed 10-20% incidence of rash and other skin changes.    She has negative germline genetic testing, but somatic testing will be done using tumor from her first surgery to see if she is a good candidate for a PARP inhibitor.    SWhittemore, Student FNP   I personally interviewed and examined the patient. Agreed with the above/below plan of care. I have directly contributed to assessment and plan of care of this patient and educated and discussed with patient and family.  Mellody Drown, MD

## 2019-08-11 NOTE — Progress Notes (Signed)
I connected with [place] on 08/11/19 at 11:30 AM EDT by video enabled telemedicine visit and verified that I am speaking with the correct person using two identifiers.   I discussed the limitations, risks, security and privacy concerns of performing an evaluation and management service by telemedicine and the availability of in-person appointments. I also discussed with the patient that there may be a patient responsible charge related to this service. The patient expressed understanding and agreed to proceed.  Other persons participating in the visit and their role in the encounter:  Patients daughter  Patient's location:  home Provider's location:  work  Risk analyst Complaint: Discuss CT scan results and further management  History of present illness: Patient is a86 year old female with a past medical history significant for stage III ovarian cancer that was treated in [place] and 2013. She underwent neoadjuvant chemotherapy followed by surgery with Dr. Beverly Milch followed by adjuvant chemotherapy. Genetic testing was negative. She then established care at [facility] in 2017. More recently patient was found to have a consistently increasing Ca1 25 level from 42.6 in December 20 18-781 in February 2020. Most recent Ca1 25 was 02/21/2005 on 07/06/2018. Scans were however negative for any recurrence. Patient had CT abdomen and pelvis with contrast done on 07/06/2018 which showed severe left hydro ureteral nephrosis. Sigmoid wall thickening differential includes carcinomatosis and colitis. She did had a repeat scan on 07/09/2018 which showed interval increase in ascites mostly within the pelvis with associated enhancement of the peritoneum suggesting carcinomatosis. Redemonstration of sigmoid wall thickening which may represent colitis versus carcinomatosis. Delayed left nephrogram with severe left hydro-ureteronephrosis similar to prior.  Given findings of colitis as well as ongoing symptoms of melena  and diarrhea patient underwent EGD as well as flexible sigmoidoscopy which showed Schatzki's ring and diverticulosis but no evidence of tumor. Her symptoms are possibly secondary to carcinomatosis. She also had other stool studies done including C. difficile and stool cultures which were negative.Patient did undergo a diagnostic tap of her ascites on 07/09/2018 which was consistent with adenocarcinoma  Patient started cycle 1 with single agent carboplatin on 07/28/2018 and finished 18cycles of weekly carbotaxol on 11/28/2018   Interval history patient has been getting on and off UTIs and has been seeing her urologist for the same.  Appetite and weight is stable.  Denies any abdominal pain or change in her bowel movements.   Review of Systems  Constitutional: Negative for chills, fever, malaise/fatigue and weight loss.  HENT: Negative for congestion, ear discharge and nosebleeds.   Eyes: Negative for blurred vision.  Respiratory: Negative for cough, hemoptysis, sputum production, shortness of breath and wheezing.   Cardiovascular: Negative for chest pain, palpitations, orthopnea and claudication.  Gastrointestinal: Negative for abdominal pain, blood in stool, constipation, diarrhea, heartburn, melena, nausea and vomiting.  Genitourinary: Negative for dysuria, flank pain, frequency, hematuria and urgency.  Musculoskeletal: Negative for back pain, joint pain and myalgias.  Skin: Negative for rash.  Neurological: Negative for dizziness, tingling, focal weakness, seizures, weakness and headaches.  Endo/Heme/Allergies: Does not bruise/bleed easily.  Psychiatric/Behavioral: Negative for depression and suicidal ideas. The patient does not have insomnia.     Allergies  Allergen Reactions  . Penicillins Anaphylaxis    Has patient had a PCN reaction causing immediate rash, facial/tongue/throat swelling, SOB or lightheadedness with hypotension: Yes Has patient had a PCN reaction causing severe rash  involving mucus membranes or skin necrosis: No Has patient had a PCN reaction that required hospitalization No Has patient had  a PCN reaction occurring within the last 10 years: No If all of the above answers are "NO", then may proceed with Cephalosporin use.    Past Medical History:  Diagnosis Date  . CAD (coronary artery disease)   . Chronic kidney disease    urinary incontinence  . Hyperthyroidism   . Hypothyroidism (acquired)   . Ovarian cancer ([place]) 2014  . Shingles   . Third degree AV block ([place]) 10/09/2015    Past Surgical History:  Procedure Laterality Date  . ABDOMINAL HYSTERECTOMY    . BLADDER REPAIR    . CORONARY ANGIOPLASTY WITH STENT PLACEMENT  2001   Dr. Isaiah Blakes in [place]  . CYSTOURETHROSCOPY, WITH INSERTION OF INDWELLING URETERAL STENT (EG,GIBBONS OR DOUBLE-J TYPE) (Left Ureter Left   . PACEMAKER INSERTION N/A 10/11/2015   Procedure: INSERTION PACEMAKER;  Surgeon: [name], MD;  Location: [facility];  Service: Cardiovascular;  Laterality: N/A;  . PARTIAL HYSTERECTOMY    . PORTA CATH INSERTION N/A 07/24/2018   Procedure: PORTA CATH INSERTION;  Surgeon: Algernon Huxley, MD;  Location: [facility];  Service: Cardiovascular;  Laterality: N/A;    Social History   Socioeconomic History  . Marital status: Widowed    Spouse name: Not on file  . Number of children: Not on file  . Years of education: Not on file  . Highest education level: Not on file  Occupational History  . Not on file  Tobacco Use  . Smoking status: Never Smoker  . Smokeless tobacco: Never Used  Vaping Use  . Vaping Use: Never used  Substance and Sexual Activity  . Alcohol use: No  . Drug use: No  . Sexual activity: Never  Other Topics Concern  . 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 Difficulty of Paying Living Expenses:   Food  Insecurity:   . Worried About Charity fundraiser in the Last Year:   . Arboriculturist in the Last Year:   Transportation Needs:   . Film/video editor (Medical):   Marland Kitchen Lack of Transportation (Non-Medical):   Physical Activity:   . Days of Exercise per Week:   . Minutes of Exercise per Session:   Stress:   . Feeling of Stress :   Social Connections:   . Frequency of Communication with Friends and Family:   . Frequency of Social Gatherings with Friends and Family:   . Attends Religious Services:   . Active Member of Clubs or Organizations:   . Attends Archivist Meetings:   Marland Kitchen Marital Status:   Intimate Partner Violence:   . Fear of Current or Ex-Partner:   . Emotionally Abused:   Marland Kitchen Physically Abused:   . Sexually Abused:     Family History  Problem Relation Age of Onset  . Diabetes Brother      Current Outpatient Medications:  .  aspirin EC 81 MG tablet, Take 81 mg by mouth daily., Disp: , Rfl:  .  atorvastatin (LIPITOR) 10 MG tablet, Take 10 mg by mouth at bedtime. , Disp: , Rfl:  .  Bioflavonoid Products (ESTER C PO), Take 500 mg by mouth daily. , Disp: , Rfl:  .  Calcium Carbonate-Vitamin D (CALTRATE 600+D PO), Take 1 tablet by mouth daily., Disp: , Rfl:  .  Cholecalciferol (VITAMIN D3) 1000 units  CAPS, Take 1,000 Units by mouth daily. , Disp: , Rfl:  .  Coenzyme Q10 (COQ10) 100 MG CAPS, Take 100 mg by mouth daily., Disp: , Rfl:  .  Digestive Enzymes (DIGESTIVE ENZYME PO), Take 1 Dose by mouth 3 (three) times daily before meals., Disp: , Rfl:  .  Glucosamine-MSM-Hyaluronic Acd (JOINT HEALTH PO), Take 1 tablet by mouth 2 (two) times a day. , Disp: , Rfl:  .  hydrocortisone 2.5 % cream, Apply 1 application topically daily as needed. , Disp: , Rfl:  .  levothyroxine (SYNTHROID) 100 MCG tablet, Take 100 mcg by mouth daily before breakfast. , Disp: , Rfl:  .  mirtazapine (REMERON) 7.5 MG tablet, Take 7.5 mg by mouth at bedtime., Disp: , Rfl:  .  b complex vitamins  tablet, Take 1 tablet by mouth daily. (Patient not taking: Reported on 08/07/2019), Disp: , Rfl:  .  ondansetron (ZOFRAN) 4 MG tablet, TAKE 1 TABLET(4 MG) BY MOUTH EVERY 8 HOURS AS NEEDED FOR NAUSEA OR VOMITING (Patient not taking: Reported on 08/07/2019), Disp: 90 tablet, Rfl: 0  CT Chest W Contrast  Result Date: 08/04/2019 CLINICAL DATA:  Recurrent ovarian cancer, status post chemotherapy. Prior hysterectomy. EXAM: CT CHEST, ABDOMEN, AND PELVIS WITH CONTRAST TECHNIQUE: Multidetector CT imaging of the chest, abdomen and pelvis was performed following the standard protocol during bolus administration of intravenous contrast. CONTRAST:  67mL OMNIPAQUE IOHEXOL 300 MG/ML  SOLN COMPARISON:  03/04/2019 FINDINGS: CT CHEST FINDINGS Cardiovascular: Heart is normal in size. No pericardial effusion. Left subclavian pacemaker. No evidence of thoracic aortic aneurysm. Atherosclerotic calcifications of the aortic arch. Three vessel coronary atherosclerosis. Right chest port terminates cavoatrial junction. Mediastinum/Nodes: No suspicious mediastinal, hilar, or axillary lymphadenopathy. Visualized thyroid is unremarkable. Lungs/Pleura: Trace right pleural effusion, new. Mild linear scarring/atelectasis in the right middle lobe and bilateral lower lobes. Mild biapical pleural-parenchymal scarring. No focal consolidation. No suspicious pulmonary nodules, noting evaluation of the lung parenchyma is constrained by respiratory motion. No pneumothorax. Musculoskeletal: Degenerative changes of the thoracic spine. CT ABDOMEN PELVIS FINDINGS Hepatobiliary: At least 4 new hypoenhancing hepatic lesions are suspected, including a dominant 13 x 16 mm lesion in segment 5 (series 2/image 63). Additional subcentimeter lesions are present in segment 2 (series 2/image 51), 4 (series 2/image 53) and 3 (series 2/image 65). These findings are worrisome for metastatic disease. Gallbladder is unremarkable. No intrahepatic or extrahepatic ductal  dilatation. Pancreas: Within normal limits. Spleen: Within normal limits. Adrenals/Urinary Tract: Adrenal glands are within normal limits. Kidneys are within normal limits. Left double-pigtail ureteral stent. Mild urothelial thickening along the left proximal collecting system/ureter (series 2/image 86), likely reactive. Bladder is mildly thick-walled but underdistended. Stomach/Bowel: Stomach is within normal limits. No evidence of bowel obstruction. Moderate colonic stool burden, suggesting constipation. Vascular/Lymphatic: No evidence of abdominal aortic aneurysm. Atherosclerotic calcifications of the abdominal aorta and branch vessels. No suspicious abdominopelvic lymphadenopathy. Reproductive: Status post hysterectomy. No adnexal masses. Serosal implant along the right rectosigmoid mesocolon measures 10 x 17 mm (series 2/image 89), previously 10 x 23 mm, similar versus mildly improved. Other: No abdominopelvic ascites. Musculoskeletal: Mild degenerative changes at L5-S1. IMPRESSION: 10 x 17 mm serosal implant along the right recto sigmoid mesocolon, stable versus mildly improved. New liver lesions measuring up to 16 mm in segment 5, worrisome for metastases. Follow-up is suggested in 3-6 months. Left double-pigtail ureteral stent in satisfactory position. Trace right pleural effusion, new. Electronically Signed   By: Julian Hy M.D.   On: 08/04/2019 10:55  CT ABDOMEN PELVIS W CONTRAST  Result Date: 08/04/2019 CLINICAL DATA:  Recurrent ovarian cancer, status post chemotherapy. Prior hysterectomy. EXAM: CT CHEST, ABDOMEN, AND PELVIS WITH CONTRAST TECHNIQUE: Multidetector CT imaging of the chest, abdomen and pelvis was performed following the standard protocol during bolus administration of intravenous contrast. CONTRAST:  50mL OMNIPAQUE IOHEXOL 300 MG/ML  SOLN COMPARISON:  03/04/2019 FINDINGS: CT CHEST FINDINGS Cardiovascular: Heart is normal in size. No pericardial effusion. Left subclavian  pacemaker. No evidence of thoracic aortic aneurysm. Atherosclerotic calcifications of the aortic arch. Three vessel coronary atherosclerosis. Right chest port terminates cavoatrial junction. Mediastinum/Nodes: No suspicious mediastinal, hilar, or axillary lymphadenopathy. Visualized thyroid is unremarkable. Lungs/Pleura: Trace right pleural effusion, new. Mild linear scarring/atelectasis in the right middle lobe and bilateral lower lobes. Mild biapical pleural-parenchymal scarring. No focal consolidation. No suspicious pulmonary nodules, noting evaluation of the lung parenchyma is constrained by respiratory motion. No pneumothorax. Musculoskeletal: Degenerative changes of the thoracic spine. CT ABDOMEN PELVIS FINDINGS Hepatobiliary: At least 4 new hypoenhancing hepatic lesions are suspected, including a dominant 13 x 16 mm lesion in segment 5 (series 2/image 63). Additional subcentimeter lesions are present in segment 2 (series 2/image 51), 4 (series 2/image 53) and 3 (series 2/image 65). These findings are worrisome for metastatic disease. Gallbladder is unremarkable. No intrahepatic or extrahepatic ductal dilatation. Pancreas: Within normal limits. Spleen: Within normal limits. Adrenals/Urinary Tract: Adrenal glands are within normal limits. Kidneys are within normal limits. Left double-pigtail ureteral stent. Mild urothelial thickening along the left proximal collecting system/ureter (series 2/image 86), likely reactive. Bladder is mildly thick-walled but underdistended. Stomach/Bowel: Stomach is within normal limits. No evidence of bowel obstruction. Moderate colonic stool burden, suggesting constipation. Vascular/Lymphatic: No evidence of abdominal aortic aneurysm. Atherosclerotic calcifications of the abdominal aorta and branch vessels. No suspicious abdominopelvic lymphadenopathy. Reproductive: Status post hysterectomy. No adnexal masses. Serosal implant along the right rectosigmoid mesocolon measures 10 x 17  mm (series 2/image 89), previously 10 x 23 mm, similar versus mildly improved. Other: No abdominopelvic ascites. Musculoskeletal: Mild degenerative changes at L5-S1. IMPRESSION: 10 x 17 mm serosal implant along the right recto sigmoid mesocolon, stable versus mildly improved. New liver lesions measuring up to 16 mm in segment 5, worrisome for metastases. Follow-up is suggested in 3-6 months. Left double-pigtail ureteral stent in satisfactory position. Trace right pleural effusion, new. Electronically Signed   By: Julian Hy M.D.   On: 08/04/2019 10:55    No images are attached to the encounter.   CMP Latest Ref Rng & Units 08/04/2019  Glucose 70 - 99 mg/dL 109(H)  BUN 8 - 23 mg/dL 20  Creatinine 0.44 - 1.00 mg/dL 0.85  Sodium 135 - 145 mmol/L 139  Potassium 3.5 - 5.1 mmol/L 4.2  Chloride 98 - 111 mmol/L 101  CO2 22 - 32 mmol/L 29  Calcium 8.9 - 10.3 mg/dL 9.4  Total Protein 6.5 - 8.1 g/dL 7.4  Total Bilirubin 0.3 - 1.2 mg/dL 0.6  Alkaline Phos 38 - 126 U/L 84  AST 15 - 41 U/L 24  ALT 0 - 44 U/L 17   CBC Latest Ref Rng & Units 08/04/2019  WBC 4.0 - 10.5 K/uL 7.0  Hemoglobin 12.0 - 15.0 g/dL 12.0  Hematocrit 36 - 46 % 37.2  Platelets 150 - 400 K/uL 238     Observation/objective: Appears in no acute distress over video visit today.  Breathing is nonlabored  Assessment and plan: Patient is a 86 year old female with history of recurrent ovarian carcinoma.  Most recently she  had 18 weekly cycles of carbotaxol which she completed in October 2020.  This is a visit to discuss CT scan results and further management  I have reviewed CT chest abdomen pelvis images independently and discussed findings with the patient and her daughter.  Patient now noted to have 4 hypoenhancing hepatic lesions including a dominant lesion that measures 13 x 16 mm which is concerning for liver metastases.  She had a chronic right rectosigmoid serosal implant which appears a little improved at this time.  No  other findings of progressive disease.  Patient CA-125 has been slowly trending up.  It was 13 in March 10 2112 in March 2021 and is now up to 96.7.  All this is concerning for recurrent/progressive disease.  Although it has been more than 6 months since the last time she received carbotaxol, her interval to progression has been faster as compared to her original diagnosis in 2017.  At her age I am concerned about hypersensitivity reactions to carboplatin especially with the weekly regimen in combination with Taxol.  So far she has not had any significant neuropathy with her prior chemotherapy.  I discussed various options with patient and her daughter. 1.  Option 1 continue surveillance but hold off on treatment at this time.  Consider treatment if she is symptomatic.  Not opting for any treatment and pursuing best supportive care/hospice is also a reasonable alternative 2.  Option 2: Proceed with second line chemotherapy and I would recommend single agent Doxil to start off with and if she tolerates it add carboplatin to the Doxil regimen every 4 weeks.  She will need a baseline echocardiogram which we will order.  Discussed risks and benefits of Doxil including all but not limited to nausea, vomiting, low blood counts, risk of infections and hospitalization.  Risk of cardiotoxicity associated with Doxil.  Treatment will be given with a palliative intent. If she tolerates this regimen well we could consider putting her on maintenance parp inhibitor after about 4 to 6 months of treatment. Her genetic testing was negative and we will proceed with NGS testing on tumor and peripheral blood at this time  Patient understands her options and would like to try chemotherapy.  She has meetings coming up with her grandchildren and great-grandchildren and would like to hold off on starting treatment until next month.  Follow-up instructions: I will see her back after echocardiogram is done in person to assess  how she is doing and her readiness to start chemotherapy.  Patient will also be seeing GYN oncology next week  I discussed the assessment and treatment plan with the patient. The patient was provided an opportunity to ask questions and all were answered. The patient agreed with the plan and demonstrated an understanding of the instructions.   The patient was advised to call back or seek an in-person evaluation if the symptoms worsen or if the condition fails to improve as anticipated.    Visit Diagnosis: 1. Malignant neoplasm of ovary, unspecified laterality ([place])   2. High risk medication use   3. Goals of care, counseling/discussion     Dr. Randa Evens, MD, MPH [facility] at [facility] Tel- [phone] 08/11/2019 12:04 PM

## 2019-08-12 ENCOUNTER — Other Ambulatory Visit: Payer: Self-pay

## 2019-08-12 ENCOUNTER — Inpatient Hospital Stay: Payer: Medicare HMO

## 2019-08-12 ENCOUNTER — Inpatient Hospital Stay (HOSPITAL_BASED_OUTPATIENT_CLINIC_OR_DEPARTMENT_OTHER): Payer: Medicare HMO | Admitting: Obstetrics and Gynecology

## 2019-08-12 VITALS — BP 124/61 | HR 87 | Temp 98.1°F | Resp 16 | Ht 61.0 in | Wt 117.0 lb

## 2019-08-12 DIAGNOSIS — C787 Secondary malignant neoplasm of liver and intrahepatic bile duct: Secondary | ICD-10-CM | POA: Diagnosis not present

## 2019-08-12 DIAGNOSIS — Z90722 Acquired absence of ovaries, bilateral: Secondary | ICD-10-CM | POA: Diagnosis not present

## 2019-08-12 DIAGNOSIS — C569 Malignant neoplasm of unspecified ovary: Secondary | ICD-10-CM | POA: Diagnosis not present

## 2019-08-12 DIAGNOSIS — J90 Pleural effusion, not elsewhere classified: Secondary | ICD-10-CM | POA: Diagnosis not present

## 2019-08-12 DIAGNOSIS — N3941 Urge incontinence: Secondary | ICD-10-CM | POA: Diagnosis not present

## 2019-08-12 DIAGNOSIS — Z9221 Personal history of antineoplastic chemotherapy: Secondary | ICD-10-CM | POA: Diagnosis not present

## 2019-08-12 DIAGNOSIS — R971 Elevated cancer antigen 125 [CA 125]: Secondary | ICD-10-CM | POA: Diagnosis not present

## 2019-08-12 DIAGNOSIS — I2510 Atherosclerotic heart disease of native coronary artery without angina pectoris: Secondary | ICD-10-CM | POA: Diagnosis not present

## 2019-08-12 DIAGNOSIS — Z90710 Acquired absence of both cervix and uterus: Secondary | ICD-10-CM | POA: Diagnosis not present

## 2019-08-12 NOTE — Progress Notes (Signed)
Pt states that she continues to have UTI- has the stent in. Also she has leakage of bladder and uses pads and cleans herself frequently

## 2019-08-13 ENCOUNTER — Other Ambulatory Visit: Payer: Self-pay

## 2019-08-13 ENCOUNTER — Encounter
Admission: RE | Admit: 2019-08-13 | Discharge: 2019-08-13 | Disposition: A | Discharge status: Home / Self Care | Payer: Medicare HMO | Source: Ambulatory Visit | Attending: Oncology | Admitting: Oncology

## 2019-08-13 DIAGNOSIS — C569 Malignant neoplasm of unspecified ovary: Secondary | ICD-10-CM | POA: Diagnosis not present

## 2019-08-13 DIAGNOSIS — Z79899 Other long term (current) drug therapy: Secondary | ICD-10-CM | POA: Insufficient documentation

## 2019-08-13 DIAGNOSIS — Z01818 Encounter for other preprocedural examination: Secondary | ICD-10-CM | POA: Insufficient documentation

## 2019-08-13 MED ORDER — TECHNETIUM TC 99M-LABELED RED BLOOD CELLS IV KIT
20.0000 | PACK | Freq: Once | INTRAVENOUS | Status: AC | PRN
  Administered 2019-08-13: 19.5 via INTRAVENOUS

## 2019-08-14 ENCOUNTER — Encounter: Payer: Self-pay | Admitting: Oncology

## 2019-08-14 ENCOUNTER — Inpatient Hospital Stay (HOSPITAL_BASED_OUTPATIENT_CLINIC_OR_DEPARTMENT_OTHER): Payer: Medicare HMO | Admitting: Oncology

## 2019-08-14 VITALS — BP 124/59 | HR 104 | Temp 98.6°F | Resp 16 | Wt 111.0 lb

## 2019-08-14 DIAGNOSIS — Z9221 Personal history of antineoplastic chemotherapy: Secondary | ICD-10-CM | POA: Diagnosis not present

## 2019-08-14 DIAGNOSIS — N3941 Urge incontinence: Secondary | ICD-10-CM | POA: Diagnosis not present

## 2019-08-14 DIAGNOSIS — R971 Elevated cancer antigen 125 [CA 125]: Secondary | ICD-10-CM | POA: Diagnosis not present

## 2019-08-14 DIAGNOSIS — J90 Pleural effusion, not elsewhere classified: Secondary | ICD-10-CM | POA: Diagnosis not present

## 2019-08-14 DIAGNOSIS — Z90722 Acquired absence of ovaries, bilateral: Secondary | ICD-10-CM | POA: Diagnosis not present

## 2019-08-14 DIAGNOSIS — C569 Malignant neoplasm of unspecified ovary: Secondary | ICD-10-CM | POA: Diagnosis not present

## 2019-08-14 DIAGNOSIS — I2510 Atherosclerotic heart disease of native coronary artery without angina pectoris: Secondary | ICD-10-CM | POA: Diagnosis not present

## 2019-08-14 DIAGNOSIS — Z7189 Other specified counseling: Secondary | ICD-10-CM

## 2019-08-14 DIAGNOSIS — Z90710 Acquired absence of both cervix and uterus: Secondary | ICD-10-CM | POA: Diagnosis not present

## 2019-08-14 DIAGNOSIS — C787 Secondary malignant neoplasm of liver and intrahepatic bile duct: Secondary | ICD-10-CM | POA: Diagnosis not present

## 2019-08-16 DIAGNOSIS — C569 Malignant neoplasm of unspecified ovary: Secondary | ICD-10-CM | POA: Diagnosis not present

## 2019-08-16 NOTE — Progress Notes (Signed)
Hematology/Oncology Consult note [facility]  Telephone:([phone] [phone]  Patient Care Team: Rusty Aus, MD as PCP - General (Internal Medicine) Clent Jacks, RN as Registered Nurse   Name of the patient: Natalie Stone  [id number]  1926-05-26   Date of visit: 08/16/19  Diagnosis-recurrent ovarian carcinoma  Chief complaint/ Reason for visit-discuss echocardiogram results and further management  Heme/Onc history: Patient is 86 year old female with a past medical history significant for stage III ovarian cancer that was treated in [place] and 2013. She underwent neoadjuvant chemotherapy followed by surgery with Dr. Beverly Milch followed by adjuvant chemotherapy. Genetic testing was negative. She then established care at [facility] in 2017. More recently patient was found to have a consistently increasing Ca1 25 level from 42.6 in December 20 18-781 in February 2020. Most recent Ca1 25 was 02/21/2005 on 07/06/2018. Scans were however negative for any recurrence. Patient had CT abdomen and pelvis with contrast done on 07/06/2018 which showed severe left hydro ureteral nephrosis. Sigmoid wall thickening differential includes carcinomatosis and colitis. She did had a repeat scan on 07/09/2018 which showed interval increase in ascites mostly within the pelvis with associated enhancement of the peritoneum suggesting carcinomatosis. Redemonstration of sigmoid wall thickening which may represent colitis versus carcinomatosis. Delayed left nephrogram with severe left hydro-ureteronephrosis similar to prior.  Given findings of colitis as well as ongoing symptoms of melena and diarrhea patient underwent EGD as well as flexible sigmoidoscopy which showed Schatzki's ring and diverticulosis but no evidence of tumor. Her symptoms are possibly secondary to carcinomatosis. She also had other stool studies done including C. difficile and stool cultures which  were negative.Patient did undergo a diagnostic tap of her ascites on 07/09/2018 which was consistent with adenocarcinoma  Patient started cycle 1 with single agent carboplatin on 07/28/2018 and finished 18cycles of weekly carbotaxol on 11/28/2018  Scans in June 2021 showed rising tumor markers and liver metastases.  Plan is to switch her to second line Doxil and add carboplatin if tolerated.  Interval history-patient is doing well for her 86 years and remains independent of her ADLs.  She has been having some recurrent episodes of UTI.  She does report feeling more fatigued now as compared to how she was before she started her carbotaxol chemotherapy 86 in June 2020.  ECOG PS- 1 Pain scale- 0   Review of systems- Review of Systems  Constitutional: Positive for malaise/fatigue. Negative for chills, fever and weight loss.  HENT: Negative for congestion, ear discharge and nosebleeds.   Eyes: Negative for blurred vision.  Respiratory: Negative for cough, hemoptysis, sputum production, shortness of breath and wheezing.   Cardiovascular: Negative for chest pain, palpitations, orthopnea and claudication.  Gastrointestinal: Negative for abdominal pain, blood in stool, constipation, diarrhea, heartburn, melena, nausea and vomiting.  Genitourinary: Negative for dysuria, flank pain, frequency, hematuria and urgency.  Musculoskeletal: Negative for back pain, joint pain and myalgias.  Skin: Negative for rash.  Neurological: Negative for dizziness, tingling, focal weakness, seizures, weakness and headaches.  Endo/Heme/Allergies: Does not bruise/bleed easily.  Psychiatric/Behavioral: Negative for depression and suicidal ideas. The patient does not have insomnia.       Allergies  Allergen Reactions  . Penicillins Anaphylaxis    Has patient had a PCN reaction causing immediate rash, facial/tongue/throat swelling, SOB or lightheadedness with hypotension: Yes Has patient had a PCN reaction causing severe rash  involving mucus membranes or skin necrosis: No Has patient had a PCN reaction that required hospitalization No Has  patient had a PCN reaction occurring within the last 10 years: No If all of the above answers are "NO", then may proceed with Cephalosporin use.     Past Medical History:  Diagnosis Date  . CAD (coronary artery disease)   . Chronic kidney disease    urinary incontinence  . Hyperthyroidism   . Hypothyroidism (acquired)   . Ovarian cancer ([place]) 2014  . Shingles   . Third degree AV block ([place]) 10/09/2015     Past Surgical History:  Procedure Laterality Date  . ABDOMINAL HYSTERECTOMY    . BLADDER REPAIR    . CORONARY ANGIOPLASTY WITH STENT PLACEMENT  2001   Dr. Isaiah Blakes in [place]  . CYSTOURETHROSCOPY, WITH INSERTION OF INDWELLING URETERAL STENT (EG,GIBBONS OR DOUBLE-J TYPE) (Left Ureter Left   . PACEMAKER INSERTION N/A 10/11/2015   Procedure: INSERTION PACEMAKER;  Surgeon: [name], MD;  Location: [facility] ORS;  Service: Cardiovascular;  Laterality: N/A;  . PARTIAL HYSTERECTOMY    . PORTA CATH INSERTION N/A 07/24/2018   Procedure: PORTA CATH INSERTION;  Surgeon: Algernon Huxley, MD;  Location: [place] [facility];  Service: Cardiovascular;  Laterality: N/A;    Social History   Socioeconomic History  . Marital status: Widowed    Spouse name: Not on file  . Number of children: Not on file  . Years of education: Not on file  . Highest education level: Not on file  Occupational History  . Not on file  Tobacco Use  . Smoking status: Never Smoker  . Smokeless tobacco: Never Used  Vaping Use  . Vaping Use: Never used  Substance and Sexual Activity  . Alcohol use: No  . Drug use: No  . Sexual activity: Never  Other Topics Concern  . 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 Difficulty of Paying Living Expenses:   Food  Insecurity:   . Worried About Charity fundraiser in the Last Year:   . Arboriculturist in the Last Year:   Transportation Needs:   . Film/video editor (Medical):   Marland Kitchen Lack of Transportation (Non-Medical):   Physical Activity:   . Days of Exercise per Week:   . Minutes of Exercise per Session:   Stress:   . Feeling of Stress :   Social Connections:   . Frequency of Communication with Friends and Family:   . Frequency of Social Gatherings with Friends and Family:   . Attends Religious Services:   . Active Member of Clubs or Organizations:   . Attends Archivist Meetings:   Marland Kitchen Marital Status:   Intimate Partner Violence:   . Fear of Current or Ex-Partner:   . Emotionally Abused:   Marland Kitchen Physically Abused:   . Sexually Abused:     Family History  Problem Relation Age of Onset  . Diabetes Brother      Current Outpatient Medications:  .  aspirin EC 81 MG tablet, Take 81 mg by mouth daily., Disp: , Rfl:  .  atorvastatin (LIPITOR) 10 MG tablet, Take 10 mg by mouth at bedtime. , Disp: , Rfl:  .  Bioflavonoid Products (ESTER C PO), Take 500 mg by mouth daily. , Disp: , Rfl:  .  Calcium Carbonate-Vitamin D (CALTRATE 600+D PO), Take 1 tablet by mouth daily., Disp: , Rfl:  .  Cholecalciferol (  VITAMIN D3) 1000 units CAPS, Take 1,000 Units by mouth daily. , Disp: , Rfl:  .  Coenzyme Q10 (COQ10) 100 MG CAPS, Take 100 mg by mouth daily. , Disp: , Rfl:  .  Digestive Enzymes (DIGESTIVE ENZYME PO), Take 1 Dose by mouth 3 (three) times daily before meals., Disp: , Rfl:  .  Glucosamine-MSM-Hyaluronic Acd (JOINT HEALTH PO), Take 1 tablet by mouth 2 (two) times a day. , Disp: , Rfl:  .  hydrocortisone 2.5 % cream, Apply 1 application topically daily as needed. , Disp: , Rfl:  .  levothyroxine (SYNTHROID) 100 MCG tablet, Take 100 mcg by mouth daily before breakfast. , Disp: , Rfl:  .  mirtazapine (REMERON) 7.5 MG tablet, Take 7.5 mg by mouth at bedtime., Disp: , Rfl:  .  b complex  vitamins tablet, Take 1 tablet by mouth daily. (Patient not taking: Reported on 08/07/2019), Disp: , Rfl:  .  ondansetron (ZOFRAN) 4 MG tablet, TAKE 1 TABLET(4 MG) BY MOUTH EVERY 8 HOURS AS NEEDED FOR NAUSEA OR VOMITING (Patient not taking: Reported on 08/07/2019), Disp: 90 tablet, Rfl: 0  Physical exam:  Vitals:   08/14/19 1000  BP: (!) 124/59  Pulse: (!) 104  Resp: 16  Temp: 98.6 F (37 C)  TempSrc: Tympanic  SpO2: 100%  Weight: 111 lb (50.3 kg)   Physical Exam Constitutional:      Comments: Thin elderly female in no acute distress  Cardiovascular:     Rate and Rhythm: Normal rate and regular rhythm.     Heart sounds: Normal heart sounds.  Pulmonary:     Effort: Pulmonary effort is normal.     Breath sounds: Normal breath sounds.  Abdominal:     General: Bowel sounds are normal.     Palpations: Abdomen is soft.  Skin:    General: Skin is warm and dry.  Neurological:     Mental Status: She is alert and oriented to person, place, and time.      CMP Latest Ref Rng & Units 08/04/2019  Glucose 70 - 99 mg/dL 109(H)  BUN 8 - 23 mg/dL 20  Creatinine 0.44 - 1.00 mg/dL 0.85  Sodium 135 - 145 mmol/L 139  Potassium 3.5 - 5.1 mmol/L 4.2  Chloride 98 - 111 mmol/L 101  CO2 22 - 32 mmol/L 29  Calcium 8.9 - 10.3 mg/dL 9.4  Total Protein 6.5 - 8.1 g/dL 7.4  Total Bilirubin 0.3 - 1.2 mg/dL 0.6  Alkaline Phos 38 - 126 U/L 84  AST 15 - 41 U/L 24  ALT 0 - 44 U/L 17   CBC Latest Ref Rng & Units 08/04/2019  WBC 4.0 - 10.5 K/uL 7.0  Hemoglobin 12.0 - 15.0 g/dL 12.0  Hematocrit 36 - 46 % 37.2  Platelets 150 - 400 K/uL 238    No images are attached to the encounter.  CT Chest W Contrast  Result Date: 08/04/2019 CLINICAL DATA:  Recurrent ovarian cancer, status post chemotherapy. Prior hysterectomy. EXAM: CT CHEST, ABDOMEN, AND PELVIS WITH CONTRAST TECHNIQUE: Multidetector CT imaging of the chest, abdomen and pelvis was performed following the standard protocol during bolus  administration of intravenous contrast. CONTRAST:  37mL OMNIPAQUE IOHEXOL 300 MG/ML  SOLN COMPARISON:  03/04/2019 FINDINGS: CT CHEST FINDINGS Cardiovascular: Heart is normal in size. No pericardial effusion. Left subclavian pacemaker. No evidence of thoracic aortic aneurysm. Atherosclerotic calcifications of the aortic arch. Three vessel coronary atherosclerosis. Right chest port terminates cavoatrial junction. Mediastinum/Nodes: No suspicious mediastinal, hilar, or axillary lymphadenopathy.  Visualized thyroid is unremarkable. Lungs/Pleura: Trace right pleural effusion, new. Mild linear scarring/atelectasis in the right middle lobe and bilateral lower lobes. Mild biapical pleural-parenchymal scarring. No focal consolidation. No suspicious pulmonary nodules, noting evaluation of the lung parenchyma is constrained by respiratory motion. No pneumothorax. Musculoskeletal: Degenerative changes of the thoracic spine. CT ABDOMEN PELVIS FINDINGS Hepatobiliary: At least 4 new hypoenhancing hepatic lesions are suspected, including a dominant 13 x 16 mm lesion in segment 5 (series 2/image 63). Additional subcentimeter lesions are present in segment 2 (series 2/image 51), 4 (series 2/image 53) and 3 (series 2/image 65). These findings are worrisome for metastatic disease. Gallbladder is unremarkable. No intrahepatic or extrahepatic ductal dilatation. Pancreas: Within normal limits. Spleen: Within normal limits. Adrenals/Urinary Tract: Adrenal glands are within normal limits. Kidneys are within normal limits. Left double-pigtail ureteral stent. Mild urothelial thickening along the left proximal collecting system/ureter (series 2/image 86), likely reactive. Bladder is mildly thick-walled but underdistended. Stomach/Bowel: Stomach is within normal limits. No evidence of bowel obstruction. Moderate colonic stool burden, suggesting constipation. Vascular/Lymphatic: No evidence of abdominal aortic aneurysm. Atherosclerotic  calcifications of the abdominal aorta and branch vessels. No suspicious abdominopelvic lymphadenopathy. Reproductive: Status post hysterectomy. No adnexal masses. Serosal implant along the right rectosigmoid mesocolon measures 10 x 17 mm (series 2/image 89), previously 10 x 23 mm, similar versus mildly improved. Other: No abdominopelvic ascites. Musculoskeletal: Mild degenerative changes at L5-S1. IMPRESSION: 10 x 17 mm serosal implant along the right recto sigmoid mesocolon, stable versus mildly improved. New liver lesions measuring up to 16 mm in segment 5, worrisome for metastases. Follow-up is suggested in 3-6 months. Left double-pigtail ureteral stent in satisfactory position. Trace right pleural effusion, new. Electronically Signed   By: Julian Hy M.D.   On: 08/04/2019 10:55   NM Cardiac Muga Rest  Result Date: 08/13/2019 CLINICAL DATA:  Ovarian cancer, pre high risk cardiotoxic chemotherapy EXAM: NUCLEAR MEDICINE CARDIAC BLOOD POOL IMAGING (MUGA) TECHNIQUE: Cardiac multi-gated acquisition was performed at rest following intravenous injection of Tc-66m labeled red blood cells. RADIOPHARMACEUTICALS:  19.5 mCi Tc-51m pertechnetate in-vitro labeled red blood cells IV COMPARISON:  None FINDINGS: Calculated LEFT ventricular ejection fraction is 62%, normal. Study was obtained at a cardiac rate of 81 bpm. Patient was rhythmic during imaging. Cine analysis of the LEFT ventricle in 3 projections demonstrates normal LEFT ventricular wall motion. IMPRESSION: Normal LEFT ventricular ejection fraction of 62%. Normal LV wall motion. Electronically Signed   By: Lavonia Dana M.D.   On: 08/13/2019 13:48   CT ABDOMEN PELVIS W CONTRAST  Result Date: 08/04/2019 CLINICAL DATA:  Recurrent ovarian cancer, status post chemotherapy. Prior hysterectomy. EXAM: CT CHEST, ABDOMEN, AND PELVIS WITH CONTRAST TECHNIQUE: Multidetector CT imaging of the chest, abdomen and pelvis was performed following the standard protocol  during bolus administration of intravenous contrast. CONTRAST:  57mL OMNIPAQUE IOHEXOL 300 MG/ML  SOLN COMPARISON:  03/04/2019 FINDINGS: CT CHEST FINDINGS Cardiovascular: Heart is normal in size. No pericardial effusion. Left subclavian pacemaker. No evidence of thoracic aortic aneurysm. Atherosclerotic calcifications of the aortic arch. Three vessel coronary atherosclerosis. Right chest port terminates cavoatrial junction. Mediastinum/Nodes: No suspicious mediastinal, hilar, or axillary lymphadenopathy. Visualized thyroid is unremarkable. Lungs/Pleura: Trace right pleural effusion, new. Mild linear scarring/atelectasis in the right middle lobe and bilateral lower lobes. Mild biapical pleural-parenchymal scarring. No focal consolidation. No suspicious pulmonary nodules, noting evaluation of the lung parenchyma is constrained by respiratory motion. No pneumothorax. Musculoskeletal: Degenerative changes of the thoracic spine. CT ABDOMEN PELVIS FINDINGS Hepatobiliary: At least  4 new hypoenhancing hepatic lesions are suspected, including a dominant 13 x 16 mm lesion in segment 5 (series 2/image 63). Additional subcentimeter lesions are present in segment 2 (series 2/image 51), 4 (series 2/image 53) and 3 (series 2/image 65). These findings are worrisome for metastatic disease. Gallbladder is unremarkable. No intrahepatic or extrahepatic ductal dilatation. Pancreas: Within normal limits. Spleen: Within normal limits. Adrenals/Urinary Tract: Adrenal glands are within normal limits. Kidneys are within normal limits. Left double-pigtail ureteral stent. Mild urothelial thickening along the left proximal collecting system/ureter (series 2/image 86), likely reactive. Bladder is mildly thick-walled but underdistended. Stomach/Bowel: Stomach is within normal limits. No evidence of bowel obstruction. Moderate colonic stool burden, suggesting constipation. Vascular/Lymphatic: No evidence of abdominal aortic aneurysm.  Atherosclerotic calcifications of the abdominal aorta and branch vessels. No suspicious abdominopelvic lymphadenopathy. Reproductive: Status post hysterectomy. No adnexal masses. Serosal implant along the right rectosigmoid mesocolon measures 10 x 17 mm (series 2/image 89), previously 10 x 23 mm, similar versus mildly improved. Other: No abdominopelvic ascites. Musculoskeletal: Mild degenerative changes at L5-S1. IMPRESSION: 10 x 17 mm serosal implant along the right recto sigmoid mesocolon, stable versus mildly improved. New liver lesions measuring up to 16 mm in segment 5, worrisome for metastases. Follow-up is suggested in 3-6 months. Left double-pigtail ureteral stent in satisfactory position. Trace right pleural effusion, new. Electronically Signed   By: Julian Hy M.D.   On: 08/04/2019 10:55     Assessment and plan- Patient is a 86 y.o. female with recurrent ovarian carcinoma here to discuss echocardiogram results and further management  Plan at this time is to proceed with second line Doxil chemotherapy.  I will add carboplatin if she is able to tolerate it without any significant side effects and that her counts can hold off.  Patient has some plans coming up to meet her family and I will therefore be starting doxil on 10/01/2019.  Baseline echocardiogram was reviewed and shows a normal EF of 62%.  Again discussed risks and benefits of Doxil including all but not limited to nausea, vomiting, low blood counts, risk of infections and hospitalizations as well as risk of cardiotoxicity.  Treatment will be given with a palliative intent.  Patient understands and agrees to proceed.  NGS testing on tumor specimen as well as liquid biopsy and peripheral blood is currently pending.   Visit Diagnosis 1. Goals of care, counseling/discussion   2. Malignant neoplasm of ovary, unspecified laterality ([place])      Dr. Randa Evens, MD, MPH [facility] [phone] 08/16/2019 7:52 AM

## 2019-08-16 NOTE — Progress Notes (Signed)
DISCONTINUE ON PATHWAY REGIMEN - Ovarian     A cycle is every 21 days:     Paclitaxel      Carboplatin   **Always confirm dose/schedule in your pharmacy ordering system**  REASON: Disease Progression PRIOR TREATMENT: [id number]: Carboplatin AUC=6 + Paclitaxel 175 mg/m2 q21 Days; Stop Treatment After 6 Cycles If Complete Response, Otherwise Continue Treatment Until Progression or Unacceptable Toxicity TREATMENT RESPONSE: Progressive Disease (PD)  START ON PATHWAY REGIMEN - Ovarian     A cycle is every 28 days:     Carboplatin      Liposomal doxorubicin   **Always confirm dose/schedule in your pharmacy ordering system**  Patient Characteristics: Recurrent or Progressive Disease, Second Line, Platinum Sensitive and ? 6 Months Since Last Therapy, Not a Candidate for Secondary Debulking Surgery Therapeutic Status: Recurrent or Progressive Disease BRCA Mutation Status: Awaiting Test Results Line of Therapy: Second Line  Intent of Therapy: Non-Curative / Palliative Intent, Discussed with Patient

## 2019-08-20 ENCOUNTER — Inpatient Hospital Stay: Payer: Medicare HMO | Admitting: Oncology

## 2019-08-20 ENCOUNTER — Inpatient Hospital Stay: Payer: Medicare HMO

## 2019-08-20 DIAGNOSIS — E782 Mixed hyperlipidemia: Secondary | ICD-10-CM | POA: Diagnosis not present

## 2019-08-20 DIAGNOSIS — I2510 Atherosclerotic heart disease of native coronary artery without angina pectoris: Secondary | ICD-10-CM | POA: Diagnosis not present

## 2019-08-20 DIAGNOSIS — T451X5A Adverse effect of antineoplastic and immunosuppressive drugs, initial encounter: Secondary | ICD-10-CM | POA: Diagnosis not present

## 2019-08-20 DIAGNOSIS — Z950 Presence of cardiac pacemaker: Secondary | ICD-10-CM | POA: Diagnosis not present

## 2019-08-20 DIAGNOSIS — I700 Atherosclerosis of aorta: Secondary | ICD-10-CM | POA: Diagnosis not present

## 2019-08-20 DIAGNOSIS — G620 Drug-induced polyneuropathy: Secondary | ICD-10-CM | POA: Diagnosis not present

## 2019-08-21 ENCOUNTER — Encounter: Payer: Self-pay | Admitting: Oncology

## 2019-08-22 DIAGNOSIS — C569 Malignant neoplasm of unspecified ovary: Secondary | ICD-10-CM | POA: Diagnosis not present

## 2019-08-26 ENCOUNTER — Encounter: Payer: Self-pay | Admitting: Oncology

## 2019-09-01 DIAGNOSIS — R300 Dysuria: Secondary | ICD-10-CM | POA: Diagnosis not present

## 2019-09-08 DIAGNOSIS — R300 Dysuria: Secondary | ICD-10-CM | POA: Diagnosis not present

## 2019-09-09 ENCOUNTER — Ambulatory Visit: Payer: Medicare HMO | Admitting: Dermatology

## 2019-09-09 DIAGNOSIS — Z87440 Personal history of urinary (tract) infections: Secondary | ICD-10-CM | POA: Diagnosis not present

## 2019-09-09 DIAGNOSIS — J300 Vasomotor rhinitis: Secondary | ICD-10-CM | POA: Diagnosis not present

## 2019-09-09 DIAGNOSIS — N3946 Mixed incontinence: Secondary | ICD-10-CM | POA: Diagnosis not present

## 2019-09-09 DIAGNOSIS — E440 Moderate protein-calorie malnutrition: Secondary | ICD-10-CM | POA: Diagnosis not present

## 2019-09-09 DIAGNOSIS — E039 Hypothyroidism, unspecified: Secondary | ICD-10-CM | POA: Diagnosis not present

## 2019-09-09 DIAGNOSIS — R5383 Other fatigue: Secondary | ICD-10-CM | POA: Diagnosis not present

## 2019-09-09 DIAGNOSIS — C569 Malignant neoplasm of unspecified ovary: Secondary | ICD-10-CM | POA: Diagnosis not present

## 2019-09-14 ENCOUNTER — Telehealth: Payer: Self-pay

## 2019-09-14 NOTE — Telephone Encounter (Signed)
Call placed to [place] regarding report for CDx. PD-L1 has been received. They reported a delay and the need for repeat testing. They are expecting results around 8/2.

## 2019-09-24 NOTE — Progress Notes (Signed)
Pharmacist Chemotherapy Monitoring - Initial Assessment    Anticipated start date: 10/01/19  Regimen:  . Are orders appropriate based on the patient's diagnosis, regimen, and cycle? Yes . Does the plan date match the patient's scheduled date? Yes . Is the sequencing of drugs appropriate? Yes . Are the premedications appropriate for the patient's regimen? Checking with md on premeds . Prior Authorization for treatment is: Approved o If applicable, is the correct biosimilar selected based on the patient's insurance? not applicable  Organ Function and Labs: Marland Kitchen Are dose adjustments needed based on the patient's renal function, hepatic function, or hematologic function? No . Are appropriate labs ordered prior to the start of patient's treatment? Yes . Other organ system assessment, if indicated: doxorubicin liposomal: Echo/ MUGA . The following baseline labs, if indicated, have been ordered: N/A  Dose Assessment: . Are the drug doses appropriate? Yes . Are the following correct: o Drug concentrations Yes o IV fluid compatible with drug Yes o Administration routes Yes o Timing of therapy Yes . If applicable, does the patient have documented access for treatment and/or plans for port-a-cath placement? yes . If applicable, have lifetime cumulative doses been properly documented and assessed? yes Lifetime Dose Tracking  . Carboplatin: 1,580 mg = 0.01 % of the maximum lifetime dose of 999,999,999 mg  o   Toxicity Monitoring/Prevention: . The patient has the following take home antiemetics prescribed: Ondansetron, Prochlorperazine, Dexamethasone and Lorazepam . The patient has the following take home medications prescribed: N/A . Medication allergies and previous infusion related reactions, if applicable, have been reviewed and addressed. Yes . The patient's current medication list has been assessed for drug-drug interactions with their chemotherapy regimen. no significant drug-drug  interactions were identified on review.  Order Review: . Are the treatment plan orders signed? No . Is the patient scheduled to see a provider prior to their treatment? Yes  I verify that I have reviewed each item in the above checklist and answered each question accordingly.  Adelina Mings 09/24/2019 8:35 AM

## 2019-10-01 ENCOUNTER — Inpatient Hospital Stay: Payer: Medicare HMO | Attending: Oncology

## 2019-10-01 ENCOUNTER — Other Ambulatory Visit: Payer: Self-pay

## 2019-10-01 ENCOUNTER — Encounter: Payer: Self-pay | Admitting: Oncology

## 2019-10-01 ENCOUNTER — Inpatient Hospital Stay: Payer: Medicare HMO

## 2019-10-01 ENCOUNTER — Inpatient Hospital Stay (HOSPITAL_BASED_OUTPATIENT_CLINIC_OR_DEPARTMENT_OTHER): Payer: Medicare HMO | Admitting: Oncology

## 2019-10-01 VITALS — BP 109/57 | HR 98 | Temp 98.6°F | Resp 16 | Ht 61.0 in | Wt 110.0 lb

## 2019-10-01 DIAGNOSIS — N189 Chronic kidney disease, unspecified: Secondary | ICD-10-CM | POA: Diagnosis not present

## 2019-10-01 DIAGNOSIS — R531 Weakness: Secondary | ICD-10-CM | POA: Insufficient documentation

## 2019-10-01 DIAGNOSIS — C786 Secondary malignant neoplasm of retroperitoneum and peritoneum: Secondary | ICD-10-CM | POA: Insufficient documentation

## 2019-10-01 DIAGNOSIS — C569 Malignant neoplasm of unspecified ovary: Secondary | ICD-10-CM | POA: Insufficient documentation

## 2019-10-01 DIAGNOSIS — Z7952 Long term (current) use of systemic steroids: Secondary | ICD-10-CM | POA: Insufficient documentation

## 2019-10-01 DIAGNOSIS — Z7982 Long term (current) use of aspirin: Secondary | ICD-10-CM | POA: Diagnosis not present

## 2019-10-01 DIAGNOSIS — Z5111 Encounter for antineoplastic chemotherapy: Secondary | ICD-10-CM | POA: Insufficient documentation

## 2019-10-01 DIAGNOSIS — E039 Hypothyroidism, unspecified: Secondary | ICD-10-CM | POA: Diagnosis not present

## 2019-10-01 DIAGNOSIS — I2510 Atherosclerotic heart disease of native coronary artery without angina pectoris: Secondary | ICD-10-CM | POA: Diagnosis not present

## 2019-10-01 DIAGNOSIS — C787 Secondary malignant neoplasm of liver and intrahepatic bile duct: Secondary | ICD-10-CM | POA: Diagnosis not present

## 2019-10-01 DIAGNOSIS — R5383 Other fatigue: Secondary | ICD-10-CM | POA: Insufficient documentation

## 2019-10-01 DIAGNOSIS — Z5189 Encounter for other specified aftercare: Secondary | ICD-10-CM | POA: Insufficient documentation

## 2019-10-01 DIAGNOSIS — Z79899 Other long term (current) drug therapy: Secondary | ICD-10-CM | POA: Insufficient documentation

## 2019-10-01 LAB — CBC WITH DIFFERENTIAL/PLATELET
Abs Immature Granulocytes: 0.01 10*3/uL (ref 0.00–0.07)
Basophils Absolute: 0 10*3/uL (ref 0.0–0.1)
Basophils Relative: 1 %
Eosinophils Absolute: 0.2 10*3/uL (ref 0.0–0.5)
Eosinophils Relative: 2 %
HCT: 38.6 % (ref 36.0–46.0)
Hemoglobin: 12.8 g/dL (ref 12.0–15.0)
Immature Granulocytes: 0 %
Lymphocytes Relative: 25 %
Lymphs Abs: 1.8 10*3/uL (ref 0.7–4.0)
MCH: 29.7 pg (ref 26.0–34.0)
MCHC: 33.2 g/dL (ref 30.0–36.0)
MCV: 89.6 fL (ref 80.0–100.0)
Monocytes Absolute: 0.7 10*3/uL (ref 0.1–1.0)
Monocytes Relative: 9 %
Neutro Abs: 4.8 10*3/uL (ref 1.7–7.7)
Neutrophils Relative %: 63 %
Platelets: 268 10*3/uL (ref 150–400)
RBC: 4.31 MIL/uL (ref 3.87–5.11)
RDW: 13.5 % (ref 11.5–15.5)
WBC: 7.5 10*3/uL (ref 4.0–10.5)
nRBC: 0 % (ref 0.0–0.2)

## 2019-10-01 LAB — COMPREHENSIVE METABOLIC PANEL
ALT: 14 U/L (ref 0–44)
AST: 25 U/L (ref 15–41)
Albumin: 3.6 g/dL (ref 3.5–5.0)
Alkaline Phosphatase: 71 U/L (ref 38–126)
Anion gap: 10 (ref 5–15)
BUN: 20 mg/dL (ref 8–23)
CO2: 27 mmol/L (ref 22–32)
Calcium: 9.2 mg/dL (ref 8.9–10.3)
Chloride: 100 mmol/L (ref 98–111)
Creatinine, Ser: 0.79 mg/dL (ref 0.44–1.00)
GFR calc Af Amer: >60 mL/min (ref >60)
GFR calc non Af Amer: >60 mL/min (ref >60)
Glucose, Bld: 144 mg/dL — ABNORMAL HIGH (ref 70–99)
Potassium: 4 mmol/L (ref 3.5–5.1)
Sodium: 137 mmol/L (ref 135–145)
Total Bilirubin: 0.5 mg/dL (ref 0.3–1.2)
Total Protein: 7.3 g/dL (ref 6.5–8.1)

## 2019-10-01 MED ORDER — SODIUM CHLORIDE 0.9 % IV SOLN
150.0000 mg | Freq: Once | INTRAVENOUS | Status: AC
  Administered 2019-10-01: 150 mg via INTRAVENOUS
  Filled 2019-10-01: qty 150

## 2019-10-01 MED ORDER — SODIUM CHLORIDE 0.9% FLUSH
10.0000 mL | INTRAVENOUS | Status: DC | PRN
  Administered 2019-10-01: 10 mL via INTRAVENOUS
  Filled 2019-10-01: qty 10

## 2019-10-01 MED ORDER — DEXAMETHASONE 4 MG PO TABS: ORAL_TABLET | ORAL | 1 refills | Status: AC

## 2019-10-01 MED ORDER — DEXTROSE 5 % IV SOLN
Freq: Once | INTRAVENOUS | Status: AC
  Filled 2019-10-01: qty 250

## 2019-10-01 MED ORDER — HEPARIN SOD (PORK) LOCK FLUSH 100 UNIT/ML IV SOLN
INTRAVENOUS | Status: AC
  Filled 2019-10-01: qty 5

## 2019-10-01 MED ORDER — SODIUM CHLORIDE 0.9 % IV SOLN
10.0000 mg | Freq: Once | INTRAVENOUS | Status: AC
  Administered 2019-10-01: 10 mg via INTRAVENOUS
  Filled 2019-10-01: qty 1

## 2019-10-01 MED ORDER — ONDANSETRON HCL 8 MG PO TABS: 8.0000 mg | ORAL_TABLET | Freq: Two times a day (BID) | ORAL | 1 refills | Status: AC | PRN

## 2019-10-01 MED ORDER — DOXORUBICIN HCL LIPOSOMAL CHEMO INJECTION 2 MG/ML
30.0000 mg/m2 | Freq: Once | INTRAVENOUS | Status: AC
  Administered 2019-10-01: 44 mg via INTRAVENOUS
  Filled 2019-10-01: qty 22

## 2019-10-01 MED ORDER — PROCHLORPERAZINE MALEATE 10 MG PO TABS: 10.0000 mg | ORAL_TABLET | Freq: Four times a day (QID) | ORAL | 1 refills | Status: AC | PRN

## 2019-10-01 MED ORDER — HEPARIN SOD (PORK) LOCK FLUSH 100 UNIT/ML IV SOLN
500.0000 [IU] | Freq: Once | INTRAVENOUS | Status: AC
  Administered 2019-10-01: 500 [IU] via INTRAVENOUS
  Filled 2019-10-01: qty 5

## 2019-10-01 MED ORDER — PEGFILGRASTIM 6 MG/0.6ML ~~LOC~~ PSKT
6.0000 mg | PREFILLED_SYRINGE | Freq: Once | SUBCUTANEOUS | Status: AC
  Administered 2019-10-01: 6 mg via SUBCUTANEOUS
  Filled 2019-10-01: qty 0.6

## 2019-10-01 NOTE — Progress Notes (Signed)
Pt feels like her appetite is not good- does not feel hungry, does drink liq and has good BM. She sleeps good almost every night

## 2019-10-01 NOTE — Progress Notes (Signed)
Hematology/Oncology Consult note [facility]  [phone] [phone]  Patient Care Team: Rusty Aus, MD as PCP - General (Internal Medicine) Clent Jacks, RN as Registered Nurse   Name of the patient: Natalie Stone  [id number]  1926-09-19   Date of visit: 10/01/19  Diagnosis-recurrent ovarian carcinoma  Chief complaint/ Reason for visit-on treatment assessment prior to cycle 1 of Doxil chemotherapy  Heme/Onc history: Patient is a38 year old female with a past medical history significant for stage III ovarian cancer that was treated in [place] and 2013. She underwent neoadjuvant chemotherapy followed by surgery with Dr. Beverly Milch followed by adjuvant chemotherapy. Genetic testing was negative. She then established care at [facility] in 2017. More recently patient was found to have a consistently increasing Ca1 25 level from 42.6 in December 20 18-781 in February 2020. Most recent Ca1 25 was 02/21/2005 on 07/06/2018. Scans were however negative for any recurrence. Patient had CT abdomen and pelvis with contrast done on 07/06/2018 which showed severe left hydro ureteral nephrosis. Sigmoid wall thickening differential includes carcinomatosis and colitis. She did had a repeat scan on 07/09/2018 which showed interval increase in ascites mostly within the pelvis with associated enhancement of the peritoneum suggesting carcinomatosis. Redemonstration of sigmoid wall thickening which may represent colitis versus carcinomatosis. Delayed left nephrogram with severe left hydro-ureteronephrosis similar to prior.  Given findings of colitis as well as ongoing symptoms of melena and diarrhea patient underwent EGD as well as flexible sigmoidoscopy which showed Schatzki's ring and diverticulosis but no evidence of tumor. Her symptoms are possibly secondary to carcinomatosis. She also had other stool studies done including C. difficile and stool cultures  which were negative.Patient did undergo a diagnostic tap of her ascites on 07/09/2018 which was consistent with adenocarcinoma  Patient started cycle 1 with single agent carboplatin on 07/28/2018 and finished 18cycles of weekly carbotaxol on 11/28/2018  Scans in June 2021 showed rising tumor markers and liver metastases.  Plan is to switch her to second line Doxil and add carboplatin if tolerated. Foundation 1 liquid biopsy did not show any evidence of actionable mutations. PD-L1 0%  Interval history-patient reports ongoing fatigue but denies other complaints at this time. Bowel movements are regular  ECOG PS- 1 Pain scale- 0   Review of systems- Review of Systems  Constitutional: Positive for malaise/fatigue. Negative for chills, fever and weight loss.  HENT: Negative for congestion, ear discharge and nosebleeds.   Eyes: Negative for blurred vision.  Respiratory: Negative for cough, hemoptysis, sputum production, shortness of breath and wheezing.   Cardiovascular: Negative for chest pain, palpitations, orthopnea and claudication.  Gastrointestinal: Negative for abdominal pain, blood in stool, constipation, diarrhea, heartburn, melena, nausea and vomiting.  Genitourinary: Negative for dysuria, flank pain, frequency, hematuria and urgency.  Musculoskeletal: Negative for back pain, joint pain and myalgias.  Skin: Negative for rash.  Neurological: Negative for dizziness, tingling, focal weakness, seizures, weakness and headaches.  Endo/Heme/Allergies: Does not bruise/bleed easily.  Psychiatric/Behavioral: Negative for depression and suicidal ideas. The patient does not have insomnia.       Allergies  Allergen Reactions  . Penicillins Anaphylaxis    Has patient had a PCN reaction causing immediate rash, facial/tongue/throat swelling, SOB or lightheadedness with hypotension: Yes Has patient had a PCN reaction causing severe rash involving mucus membranes or skin necrosis: No Has patient  had a PCN reaction that required hospitalization No Has patient had a PCN reaction occurring within the last 10 years: No If  all of the above answers are "NO", then may proceed with Cephalosporin use.     Past Medical History:  Diagnosis Date  . CAD (coronary artery disease)   . Chronic kidney disease    urinary incontinence  . Hyperthyroidism   . Hypothyroidism (acquired)   . Ovarian cancer ([place]) 2014  . Shingles   . Third degree AV block ([place]) 10/09/2015     Past Surgical History:  Procedure Laterality Date  . ABDOMINAL HYSTERECTOMY    . BLADDER REPAIR    . CORONARY ANGIOPLASTY WITH STENT PLACEMENT  2001   Dr. Isaiah Blakes in [place]  . CYSTOURETHROSCOPY, WITH INSERTION OF INDWELLING URETERAL STENT (EG,GIBBONS OR DOUBLE-J TYPE) (Left Ureter Left   . PACEMAKER INSERTION N/A 10/11/2015   Procedure: INSERTION PACEMAKER;  Surgeon: [name], MD;  Location: [facility] ORS;  Service: Cardiovascular;  Laterality: N/A;  . PARTIAL HYSTERECTOMY    . PORTA CATH INSERTION N/A 07/24/2018   Procedure: PORTA CATH INSERTION;  Surgeon: Algernon Huxley, MD;  Location: [facility];  Service: Cardiovascular;  Laterality: N/A;    Social History   Socioeconomic History  . Marital status: Widowed    Spouse name: Not on file  . Number of children: Not on file  . Years of education: Not on file  . Highest education level: Not on file  Occupational History  . Not on file  Tobacco Use  . Smoking status: Never Smoker  . Smokeless tobacco: Never Used  Vaping Use  . Vaping Use: Never used  Substance and Sexual Activity  . Alcohol use: No  . Drug use: No  . Sexual activity: Never  Other Topics Concern  . 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 Difficulty of Paying Living Expenses:   Food Insecurity:   . Worried About Charity fundraiser in the Last  Year:   . Arboriculturist in the Last Year:   Transportation Needs:   . Film/video editor (Medical):   Marland Kitchen Lack of Transportation (Non-Medical):   Physical Activity:   . Days of Exercise per Week:   . Minutes of Exercise per Session:   Stress:   . Feeling of Stress :   Social Connections:   . Frequency of Communication with Friends and Family:   . Frequency of Social Gatherings with Friends and Family:   . Attends Religious Services:   . Active Member of Clubs or Organizations:   . Attends Archivist Meetings:   Marland Kitchen Marital Status:   Intimate Partner Violence:   . Fear of Current or Ex-Partner:   . Emotionally Abused:   Marland Kitchen Physically Abused:   . Sexually Abused:     Family History  Problem Relation Age of Onset  . Diabetes Brother      Current Outpatient Medications:  .  AMERICAN GINSENG PO, Take 2 tablets by mouth daily. 500 mg tab and she takes 2 a day, Disp: , Rfl:  .  aspirin EC 81 MG tablet, Take 81 mg by mouth daily., Disp: , Rfl:  .  atorvastatin (LIPITOR) 10 MG tablet, Take 10 mg by mouth at bedtime. , Disp: , Rfl:  .  Bioflavonoid Products (ESTER C PO), Take 500 mg by mouth daily. , Disp: , Rfl:  .  Calcium Carbonate-Vitamin D (CALTRATE 600+D PO), Take 1  tablet by mouth daily., Disp: , Rfl:  .  Cholecalciferol (VITAMIN D3) 1000 units CAPS, Take 1,000 Units by mouth daily. , Disp: , Rfl:  .  Coenzyme Q10 (COQ10) 100 MG CAPS, Take 100 mg by mouth daily. , Disp: , Rfl:  .  Cyanocobalamin (VITAMIN B-12) 1000 MCG/15ML LIQD, Take 1 mL by mouth daily., Disp: , Rfl:  .  D-MANNOSE PO, Take 1 tablet by mouth daily., Disp: , Rfl:  .  Digestive Enzymes (DIGESTIVE ENZYME PO), Take 1 Dose by mouth 3 (three) times daily before meals., Disp: , Rfl:  .  Glucosamine-MSM-Hyaluronic Acd (JOINT HEALTH PO), Take 1 tablet by mouth 2 (two) times a day. , Disp: , Rfl:  .  hydrocortisone 2.5 % cream, Apply 1 application topically daily as needed. , Disp: , Rfl:  .  levothyroxine  (SYNTHROID) 100 MCG tablet, Take 100 mcg by mouth daily before breakfast. , Disp: , Rfl:  .  mirtazapine (REMERON) 7.5 MG tablet, Take 7.5 mg by mouth at bedtime., Disp: , Rfl:  .  Zinc 25 MG TABS, Take 1 tablet by mouth., Disp: , Rfl:  .  dexamethasone (DECADRON) 4 MG tablet, Take 2 tablets by mouth once a day for 3 days after chemo. Take with food., Disp: 30 tablet, Rfl: 1 .  ondansetron (ZOFRAN) 4 MG tablet, TAKE 1 TABLET(4 MG) BY MOUTH EVERY 8 HOURS AS NEEDED FOR NAUSEA OR VOMITING (Patient not taking: Reported on 08/07/2019), Disp: 90 tablet, Rfl: 0 .  ondansetron (ZOFRAN) 8 MG tablet, Take 1 tablet (8 mg total) by mouth 2 (two) times daily as needed. Start on the third day after chemotherapy., Disp: 30 tablet, Rfl: 1 .  prochlorperazine (COMPAZINE) 10 MG tablet, Take 1 tablet (10 mg total) by mouth every 6 (six) hours as needed (Nausea or vomiting)., Disp: 30 tablet, Rfl: 1 No current facility-administered medications for this visit.  Facility-Administered Medications Ordered in Other Visits:  .  heparin lock flush 100 unit/mL, 500 Units, Intravenous, Once, Randa Evens C, MD .  pegfilgrastim (NEULASTA ONPRO KIT) injection 6 mg, 6 mg, Subcutaneous, Once, Randa Evens C, MD .  sodium chloride flush (NS) 0.9 % injection 10 mL, 10 mL, Intravenous, PRN, Sindy Guadeloupe, MD, 10 mL at 10/01/19 0901  Physical exam:  Vitals:   10/01/19 0921  BP: (!) 109/57  Pulse: 98  Resp: 16  Temp: 98.6 F (37 C)  Weight: 110 lb (49.9 kg)  Height: _0  (1.549 m)   Physical Exam Constitutional:      General: She is not in acute distress. Cardiovascular:     Rate and Rhythm: Normal rate and regular rhythm.     Heart sounds: Normal heart sounds.  Pulmonary:     Effort: Pulmonary effort is normal.     Breath sounds: Normal breath sounds.  Abdominal:     General: Bowel sounds are normal.     Palpations: Abdomen is soft.  Skin:    General: Skin is warm and dry.  Neurological:     Mental Status: She  is alert and oriented to person, place, and time.      CMP Latest Ref Rng & Units 10/01/2019  Glucose 70 - 99 mg/dL 144(H)  BUN 8 - 23 mg/dL 20  Creatinine 0.44 - 1.00 mg/dL 0.79  Sodium 135 - 145 mmol/L 137  Potassium 3.5 - 5.1 mmol/L 4.0  Chloride 98 - 111 mmol/L 100  CO2 22 - 32 mmol/L 27  Calcium 8.9 - 10.3 mg/dL  9.2  Total Protein 6.5 - 8.1 g/dL 7.3  Total Bilirubin 0.3 - 1.2 mg/dL 0.5  Alkaline Phos 38 - 126 U/L 71  AST 15 - 41 U/L 25  ALT 0 - 44 U/L 14   CBC Latest Ref Rng & Units 10/01/2019  WBC 4.0 - 10.5 K/uL 7.5  Hemoglobin 12.0 - 15.0 g/dL 12.8  Hematocrit 36 - 46 % 38.6  Platelets 150 - 400 K/uL 268     Assessment and plan- Patient is a 86 y.o. female with recurrent ovarian carcinoma here for on treatment assessment prior to cycle 1 of Doxil.  Scans in January 2021 showed evidence of new hepatic metastases and along with rising CA-125 this was consistent with recurrent disease. Plan is to proceed with second line Doxil chemotherapy and consider adding carboplatin if she can tolerate it.  Counts okay to proceed with Doxil chemotherapy today with ongoing Neulasta support. I have reduced the dose of Doxil to 30 mg per metered square. Baseline echocardiogram was normal. Discussed risks and benefits of Doxil including all but not limited to fatigue, nausea, vomiting, low blood counts, risk of infections and hospitalizations. Treatment will be given with a palliative intent. Patient understands and agrees to proceed this plan.  I will see her back in 2 weeks with CBC with differential and CMP to see how she is tolerating her chemotherapy and for possible fluids. She will be getting Doxil every 4 weeks. If her counts hold up I will consider adding carboplatin after 4 cycles   Visit Diagnosis 1. Malignant neoplasm of ovary, unspecified laterality ([place])   2. Encounter for antineoplastic chemotherapy      Dr. Randa Evens, MD, MPH [facility] [phone] 10/01/2019 1:15 PM

## 2019-10-02 LAB — CA 125: Cancer Antigen (CA) 125: 135 U/mL — ABNORMAL HIGH (ref 0.0–38.1)

## 2019-10-05 ENCOUNTER — Telehealth: Payer: Self-pay

## 2019-10-05 NOTE — Telephone Encounter (Signed)
Continuing to follow up on [facility] CDx results. They did not report around 8/2 as they anticipated. Called again today and they currently do not know why they have not reported. They are contacting the lab today and should call me back with a reason/response regarding these results.

## 2019-10-07 ENCOUNTER — Encounter: Payer: Self-pay | Admitting: Obstetrics and Gynecology

## 2019-10-07 ENCOUNTER — Other Ambulatory Visit: Payer: Self-pay

## 2019-10-07 ENCOUNTER — Inpatient Hospital Stay (HOSPITAL_BASED_OUTPATIENT_CLINIC_OR_DEPARTMENT_OTHER): Payer: Medicare HMO | Admitting: Obstetrics and Gynecology

## 2019-10-07 VITALS — BP 122/58 | HR 102 | Temp 98.4°F | Resp 16 | Ht 61.0 in | Wt 109.9 lb

## 2019-10-07 DIAGNOSIS — C569 Malignant neoplasm of unspecified ovary: Secondary | ICD-10-CM

## 2019-10-07 NOTE — Progress Notes (Signed)
Gynecologic Oncology Interval Visit   Patient Care Team: Rusty Aus, MD as PCP - General (Internal Medicine) Clent Jacks, RN as Registered Nurse   Name of the patient: Natalie Stone  [id number]  January 18, 1927   Date of visit: 10/07/2019  Referring Provider: Dr. Janese Banks  Chief Concern: recurrent high grade ovarian cancer   Subjective:  Natalie Stone is a 86 y.o. female with advanced ovarian cancer treated in 2013 and recurrence diagnosed 5/20 now s/p 18 cycles of weekly chemotherapy with normalization of CA 125 with recurrent disease now s/p cycle 1 of second line doxil who returns to clinic today for follow-up and pelvic exam.   She initiated cycle 1 of doxil. Foundation 1 liquid biopsy did not show any evidence of actionable mutations. PD-L1 0%  Today, she returns for pelvic exam.    Gynecologic Oncology History:   Ovarian cancer- stage III per patient [no records available]. Status post neoadjuvant chemotherapy followed by surgery with Dr Eppie Gibson in [place] in 2013; followed by adjuvant chemotherapy. Clinically no concerns for recurrence at this time.  She said she had negative genetic testing.  Establish care at [facility] in 2017 since she moved back to this area to be near family.  She is grandmother of Dr. Benjaman Kindler.  CA125 3/18 =13.7  02/05/2017- CT C/A/P IMPRESSION: 1. 14 x 13 mm low-density oval mass in the right retrocrural location at the level of the aortic hiatus. This may reflect a mildly enlarged retrocrural lymph node. This could potentially reflect metastatic adenopathy. It could reflect a small lymphangiocele or chronic reactive lymph node. If there are any previous CT exams available for comparison, these could help exclude metastatic disease. If not, follow-up chest CT in 2-3 months to reassess for stability would be recommended. 2. No other findings to suggest metastatic disease. No evidence of locally recurrent ovarian carcinoma. Status post hysterectomy. 3.  No acute findings. 4. Coronary artery calcifications.  Aortic atherosclerosis. 5. Mild increased colonic stool burden.  CA125 01/19 = 56.5  05/01/2017- CT C/A/P IMPRESSION: -Status post hysterectomy and suspected bilateral salpingo-oophorectomy. - No findings specific for recurrent or metastatic disease.  - Benign cystic lesion in the right retrocrural space is likely related to the thoracic duct. This is not considered suspicious for a lymph node. - 4 mm subpleural node in the posterior right middle lobe, new, nonspecific. This appearance is favored to be infectious/inflammatory. However, consider follow-up CT chest in 6 months.  CA125 05/19 = 174.9   06/27/17 CT C/A/P IMPRESSION: 1. There is a small amount of complex fluid within the pelvis with peripheral peritoneal enhancement. Findings are indeterminate but concerning for the possibility of recurrent disease. 2. Additionally within the pelvis there is a small hyperdense nodule which likely represents a hyperdense diverticulum as opposed to a peritoneal nodule as no definite additional peritoneal nodules are identified.  Recommend attention on follow-up.  3. Slight interval decrease in size of right middle lobe nodule, likely resolving infectious/inflammatory process. Recommend attention on follow-up.  Reviewed the May 2019 CT scan with radiology and there is nothing that is amenable to biopsy.   CA-125 06/19-  210.2  Mild SUI.  She had bladder repair many years ago for SUI after having children.  Has been treated with anticholenergics in past, but mouth was very dry and had problems talking so stopped the medication.  Now uses Mirabegron 50 mg qd (a B-agonist) for urge incontinence per Dr Buel Ream suggestion.  No symptoms since she started this medication.  Since 10/18 CA125 has risen progressively from 33 to >1300.  She has had multiple negative scans of the chest/abd/pelvis.  In 5/20 she was admitted to [facility] and diagnosed with  pelvic recurrence of her cancer and decision made to treat with Carboplain/Taxol.     Last screening colonoscopy 2013.    In 5/19 the Invitae 83 gene panel was negative for pathogenic mutations. Had one VUS in the MET gene.   CT chest 10/30/17 showed no findings suspicious for metastatic disease. Solitary 24mm RML nodule slightly decreased & though to be benign.   CT abd/pelvis 11/19 was negative for acute findings or recurrent tumor.   Seen by Dr. Johnnette Litter on 02/05/2018.  NED and asymptomatic at that time.  Normal CT scan and normal MRI of brain.  She presented to [facility] ER with complaints of abdominal pain, fatigue, nausea, poor appetite, and weight loss. She was found to have melena and admitted for concerns of recurrence of ovarian cancer. CA 125 was 1307 and CT demonstrated carcinomatosis and soft tissue pelvic mass, and hydronephrosis of left kidney. Biopsy was discussed but felt not to be feasible. Paracentesis was performed on 5/20 and cytology consistent with adenocarcinoma. She underwent left ureteral stent placement on 5/20. Given melena and diarrhea she also underwent flex sig and EGD which noted mild colonic edema, diverticulosis, and Schatski ring but no evidence of tumor burden or acute pathology. C diff, fecal leukocytes, stool O&P, stool culture, fecal calprotectin, and h pylori were negative. Ultimately melena and diarrhea felt to be secondary to carcinomatosis. After extensive discussion of goals of care with patient and family, she elected to reinitiate chemotherapy.    She initiated weekly carbo AUC 2 on 07/28/2018 for recurrent disease.  For week 4, Taxol 60 mg/m was added.    CT scan 11/11/18-  1. Stable bilobed cystic lesion in the right pelvis. 2. Resolution of ascites. 3. Two small enhancing nodules in the left abdomen adjacent to the ascending colon could be small stable peritoneal nodules or diverticuli. 4. No omental or peritoneal surface lesions are identified  elsewhere. 5. No findings for metastatic disease involving the chest. 6. Stable advanced atherosclerotic vascular calcifications in the chest, abdomen and pelvis. 7. Stable left-sided double-J ureteral stent with minimal residual left-sided hydronephrosis.  She completed 18 cycles of weekly dosing on 11/28/2018. D15, Cycle 6 was held due to fatigue and weakness. She also suffered anemia and leukopenia with treatment.  CA125 normalized.   Left ureteral stent changed by Dr Bryson Ha 01/08/19 and retrograde showed persistent stricture of distal ureter.  Plan to change again in 4-5 months.   Recent UTI treated with antibiotics.    CT scan 01/21 Interval CT imaging on 03/04/19 showed interval decrease in size of mass in the low right hemipelvis adjacent to rectum measuring 2.3 x 1.0 (previously 3.4 x 1.8 cm on 11/11/2018 imaging) consistent with treatment response. No evidence of residual peritoneal or metastatic disease elsewhere.   IMPRESSION: 1. Interval decrease in size of a mass in the low right hemipelvis adjacent to the rectum measuring approximately 2.3 x 1.0 cm, previously 3.4 x 1.8 cm when measured similarly (series 2, image 92). Findings are consistent with treatment response. 2. No evidence of residual peritoneal or metastatic disease in the abdomen or pelvis. No evidence of metastatic disease in the chest. 3. Stable, low lying position of left-sided ureteral stent, with unchanged moderate left hydronephrosis and proximal hydroureter. 4. Large burden of stool throughout the colon. 5. Coronary artery disease.  Aortic Atherosclerosis (ICD10-I70.0).  CT C/A/P 08/04/2019 CT CHEST, ABDOMEN, AND PELVIS WITH CONTRAST IMPRESSION: 10 x 17 mm serosal implant along the right recto sigmoid mesocolon, stable versus mildly improved. New liver lesions measuring up to 16 mm in segment 5, worrisome for metastases. Follow-up is suggested in 3-6 months. Left double-pigtail ureteral stent in satisfactory  position. Trace right pleural effusion, new.   Her CA125 drawn on 08/04/2019 is elevated at 96.7. Additionally, the CT scan shows new liver lesions, worrisome for metastases.   Genetic Testing: Germline BRCA genetic testing normal.  In 5/19 the Invitae 83 gene panel was negative for pathogenic mutations.     Oncology History Overview Note  # NOV 2013-RIGHT OVARIAN CANCER-HIGH GRADE SEROUS CA 125-996 STAGE III [[facility]Stephan[facility]Jan 2014- neo-adj chemo x3 M;April 2014 TAH & BSO ; chemo x 30M [finished C-6; July 2014]. Ca 125 -13; N  # GENETIC TESTING-"OncoGene Dx"-NEG  # May-June 2020- Recurrent adenocarcinoma [ascites s/p para; [facility]] [id number].   # June 8th 2020- Carbo AUC 2  # may 2020- Left hydro-ureteral -n ephrosis [s/p stenting]; diarrhea-? malignancy s/p sigmoidscopy  [[facility]]  CT- showed severe left hydro ureteral nephrosis.  Sigmoid wall thickening differential includes carcinomatosis and colitis.  She did had a repeat scan on 07/09/2018 which showed interval increase in ascites mostly within the pelvis with associated enhancement of the peritoneum suggesting carcinomatosis.  Redemonstration of sigmoid wall thickening which may represent colitis versus carcinomatosis.  Delayed left nephrogram with severe left hydro-ureteronephrosis similar to prior.   Given findings of colitis as well as ongoing symptoms of melena and diarrhea patient underwent EGD as well as flexible sigmoidoscopy which showed Schatzki's ring and diverticulosis but no evidence of tumor.  Her symptoms are possibly secondary to carcinomatosis.  She also had other stool studies done including C. difficile and stool cultures which were negative.Patient did undergo a diagnostic tap of her ascites on 07/09/2018 which was consistent with adenocarcinoma  S/p pacemaker [Dr.Paraschos] --------------------------------------   DIAGNOSIS: recurrent ovarian cancer  STAGE: IV; GOALS: palliative  CURRENT/MOST  RECENT THERAPY: weekly carbo-Taxol     Malignant neoplasm of ovary ([place])  11/23/2015 Initial Diagnosis   Ovarian cancer, unspecified laterality ([place])   07/28/2018 -  Chemotherapy   The patient had palonosetron (ALOXI) injection 0.25 mg, 0.25 mg, Intravenous,  Once, 6 of 6 cycles Administration: 0.25 mg (07/28/2018), 0.25 mg (08/04/2018), 0.25 mg (08/11/2018), 0.25 mg (08/18/2018), 0.25 mg (08/25/2018), 0.25 mg (09/01/2018), 0.25 mg (09/15/2018), 0.25 mg (09/29/2018), 0.25 mg (10/13/2018), 0.25 mg (10/30/2018), 0.25 mg (11/14/2018), 0.25 mg (11/21/2018), 0.25 mg (12/05/2018), 0.25 mg (09/08/2018), 0.25 mg (10/06/2018), 0.25 mg (11/06/2018), 0.25 mg (11/28/2018) CARBOplatin (PARAPLATIN) 110 mg in sodium chloride 0.9 % 100 mL chemo infusion, 110 mg (100 % of original dose 113.4 mg), Intravenous,  Once, 6 of 6 cycles Dose modification:   (original dose 113.4 mg, Cycle 1) Administration: 110 mg (07/28/2018), 110 mg (08/04/2018), 110 mg (08/11/2018), 110 mg (08/18/2018), 110 mg (08/25/2018), 90 mg (09/01/2018), 90 mg (09/15/2018), 90 mg (09/29/2018), 90 mg (10/13/2018), 90 mg (10/30/2018), 80 mg (11/14/2018), 80 mg (11/21/2018), 80 mg (12/05/2018), 90 mg (09/08/2018), 90 mg (10/06/2018), 80 mg (11/06/2018), 80 mg (11/28/2018) PACLitaxel (TAXOL) 84 mg in sodium chloride 0.9 % 250 mL chemo infusion (</= $RemoveBefor'80mg'UzcAmDBdzQSc$ /m2), 60 mg/m2 = 84 mg (100 % of original dose 60 mg/m2), Intravenous,  Once, 4 of 4 cycles Dose modification: 60 mg/m2 (original dose 60 mg/m2, Cycle 3, Reason: Other (see comments), Comment: modify rate) Administration: 84  mg (09/08/2018), 84 mg (09/15/2018), 84 mg (09/29/2018), 84 mg (10/06/2018), 84 mg (10/13/2018), 84 mg (10/30/2018), 84 mg (11/14/2018), 84 mg (11/21/2018), 84 mg (12/05/2018), 84 mg (11/06/2018), 84 mg (11/28/2018) PACLitaxel (TAXOL) 84 mg in sodium chloride 0.9 % 250 mL chemo infusion (> 48m/m2), 60 mg/m2 = 84 mg (100 % of original dose 60 mg/m2), Intravenous,  Once, 2 of 2 cycles Dose modification: 60 mg/m2 (original dose 60  mg/m2, Cycle 1, Reason: Other (see comments), Comment: weekly) Administration: 84 mg (08/18/2018), 84 mg (08/25/2018), 84 mg (09/01/2018) fosaprepitant (EMEND) 150 mg, dexamethasone (DECADRON) 12 mg in sodium chloride 0.9 % 145 mL IVPB, , Intravenous,  Once, 4 of 4 cycles Administration:  (07/28/2018),  (08/04/2018),  (08/11/2018),  (08/18/2018),  (08/25/2018),  (09/01/2018),  (09/15/2018),  (09/29/2018),  (10/13/2018),  (09/08/2018),  (10/06/2018)  for chemotherapy treatment.      Problem List: Patient Active Problem List   Diagnosis Date Noted  . Chemotherapy induced neutropenia ([place] 09/01/2018  . Goals of care, counseling/discussion 07/21/2018  . CINV (chemotherapy-induced nausea and vomiting) 07/08/2018  . Diarrhea 07/06/2018  . Hydronephrosis of left kidney 07/06/2018  . Combined hyperlipidemia 02/07/2017  . Adult idiopathic generalized osteoporosis 11/05/2016  . Medicare annual wellness visit, initial 11/05/2016  . Malignant [place] of [place] ([place] 11/23/2015  . Acquired hypothyroidism 10/26/2015  . Aortic atherosclerosis ([place] 10/26/2015  . Cardiac pacemaker 10/26/2015  . Chemotherapy-induced neuropathy ([place] 10/26/2015  . Coronary artery disease involving native coronary artery of native heart without angina pectoris 10/26/2015  . Pacemaker pocket hematoma 10/26/2015  . Status post placement of cardiac pacemaker 10/26/2015  . Bradycardia 10/09/2015  . Third degree AV block ([place] 10/09/2015    Past Medical History: Past Medical History:  Diagnosis Date  . CAD (coronary artery disease)   . Chronic kidney disease    urinary incontinence  . Hyperthyroidism   . Hypothyroidism (acquired)   . [place]ian cancer ([place] 2014  . Shingles   . Third degree AV block ([place] 10/09/2015    Past Surgical History: Past Surgical History:  Procedure Laterality Date  . ABDOMINAL HYSTERECTOMY    . BLADDER REPAIR    . CORONARY ANGIOPLASTY WITH STENT PLACEMENT  2001   [place] .  CYSTOURETHROSCOPY, WITH INSERTION OF INDWELLING URETERAL STENT (EG,GIBBONS OR DOUBLE-J TYPE) (Left Ureter Left   . PACEMAKER INSERTION N/A 10/11/2015   Procedure: INSERTION PACEMAKER;  Surgeon: AIsaias Cowman MD;  Location: [facility] ORS;  Service: Cardiovascular;  Laterality: N/A;  . PARTIAL HYSTERECTOMY    . PORTA CATH INSERTION N/A 07/24/2018   Procedure: PORTA CATH INSERTION;  Surgeon: DAlgernon Huxley MD;  [facility];  Service: Cardiovascular;  Laterality: N/A;     Family History: Family History  Problem Relation Age of Onset  . Diabetes Brother     Social History: Social History   Socioeconomic History  . Marital status: Widowed    Spouse name: Not on file  . Number of children: Not on file  . Years of education: Not on file  . Highest education level: Not on file  Occupational History  . Not on file  Tobacco Use  . Smoking status: Never Smoker  . Smokeless tobacco: Never Used  Vaping Use  . Vaping Use: Never used  Substance and Sexual Activity  . Alcohol use: No  . Drug use: No  . Sexual activity: Never  Other Topics Concern  . 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 Difficulty of Paying Living Expenses:   Food Insecurity:   . Worried About Charity fundraiser in the Last Year:   . Arboriculturist in the Last Year:   Transportation Needs:   . Film/video editor (Medical):   Marland Kitchen Lack of Transportation (Non-Medical):   Physical Activity:   . Days of Exercise per Week:   . Minutes of Exercise per Session:   Stress:   . Feeling of Stress :   Social Connections:   . Frequency of Communication with Friends and Family:   . Frequency of Social Gatherings with Friends and Family:   . Attends Religious Services:   . Active Member of Clubs or Organizations:   . Attends Archivist Meetings:   Marland Kitchen  Marital Status:   Intimate Partner Violence:   . Fear of Current or Ex-Partner:   . Emotionally Abused:   Marland Kitchen Physically Abused:   . Sexually Abused:     Allergies: Allergies  Allergen Reactions  . Penicillins Anaphylaxis    Has patient had a PCN reaction causing immediate rash, facial/tongue/throat swelling, SOB or lightheadedness with hypotension: Yes Has patient had a PCN reaction causing severe rash involving mucus membranes or skin necrosis: No Has patient had a PCN reaction that required hospitalization No Has patient had a PCN reaction occurring within the last 10 years: No If all of the above answers are "NO", then may proceed with Cephalosporin use.    Current Medications: Current Outpatient Medications  Medication Sig Dispense Refill  . AMERICAN GINSENG PO Take 2 tablets by mouth daily. 500 mg tab and she takes 2 a day    . aspirin EC 81 MG tablet Take 81 mg by mouth daily.    Marland Kitchen atorvastatin (LIPITOR) 10 MG tablet Take 10 mg by mouth at bedtime.     Marland Kitchen Bioflavonoid Products (ESTER C PO) Take 500 mg by mouth daily.     . Calcium Carbonate-Vitamin D (CALTRATE 600+D PO) Take 1 tablet by mouth daily.    . Cholecalciferol (VITAMIN D3) 1000 units CAPS Take 1,000 Units by mouth daily.     . Coenzyme Q10 (COQ10) 100 MG CAPS Take 100 mg by mouth daily.     . Cyanocobalamin (VITAMIN B-12) 1000 MCG/15ML LIQD Take 1 mL by mouth daily.    . D-MANNOSE PO Take 1 tablet by mouth daily.    Marland Kitchen dexamethasone (DECADRON) 4 MG tablet Take 2 tablets by mouth once a day for 3 days after chemo. Take with food. 30 tablet 1  . Digestive Enzymes (DIGESTIVE ENZYME PO) Take 1 Dose by mouth 3 (three) times daily before meals.    . Glucosamine-MSM-Hyaluronic Acd (JOINT HEALTH PO) Take 1 tablet by mouth 2 (two) times a day.     . hydrocortisone 2.5 % cream Apply 1 application topically daily as needed.     Marland Kitchen levothyroxine (SYNTHROID) 100 MCG tablet Take 100 mcg by mouth daily before breakfast.     .  mirtazapine (REMERON) 7.5 MG tablet Take 7.5 mg by mouth at bedtime.    . ondansetron (ZOFRAN) 8 MG tablet Take 1 tablet (8 mg total) by mouth 2 (two) times daily as needed. Start on the third day after chemotherapy. 30 tablet 1  . prochlorperazine (COMPAZINE) 10 MG tablet Take 1 tablet (10 mg total) by mouth every 6 (six) hours  as needed (Nausea or vomiting). 30 tablet 1  . Zinc 25 MG TABS Take 1 tablet by mouth.    . ondansetron (ZOFRAN) 4 MG tablet TAKE 1 TABLET(4 MG) BY MOUTH EVERY 8 HOURS AS NEEDED FOR NAUSEA OR VOMITING (Patient not taking: Reported on 08/07/2019) 90 tablet 0   No current facility-administered medications for this visit.   Review of Systems General: fatigue, weakness Skin: no complaints Eyes: no complaints HEENT: no complaints Breasts: no complaints Pulmonary: no complaints Cardiac: no complaints Gastrointestinal: abdominal bloating Genitourinary/Sexual:  Incontinence- frequent Ob/Gyn: vulvar irritation Musculoskeletal: back pain Hematology: no complaints Neurologic/Psych: no complaints   Objective:  Physical Examination:  BP (!) 122/58   Pulse (!) 102   Temp 98.4 F (36.9 C) (Oral)   Resp 16   Ht 5' 1" (1.549 m)   Wt 109 lb 14.4 oz (49.9 kg)   BMI 20.77 kg/m    ECOG Performance Status: 2 - Symptomatic, <50% confined to bed  GENERAL: thin built, elderly female. Fatigued appearing.  HEENT:  Sclera clear. Anicteric.  NODES:  Negative axillary, supraclavicular, inguinal lymph node survery LUNGS:  Clear to auscultation bilaterally HEART:  Regular rate and rhythm ABDOMEN:  Soft, nontender.  No hernias, incisions well healed. No masses or ascites EXTREMITIES:  No peripheral edema. Atraumatic. No cyanosis SKIN:  Clear with no obvious rashes or skin changes NEURO:  Nonfocal. Well oriented.  Appropriate affect  PELVIC: Exam chaperoned by CMA EGBUS/Vagina atrophic, small skin fissure along right lower labia.  No infection.   Bimanual/RV: no masses.       Assessment:  Natalie Stone is a 86 y.o. female diagnosed with advanced ovarian cancer treated with neoadjuvant chemo and interval debulking in 2013 in [place].    Germline BRCA genetic testing normal.  In 5/19 the Invitae 83 gene panel was negative for pathogenic mutations.   Recurrent peritoneal disease documented by paracentesis after CA-125 had been progressively rising for over a year with normal CT scans. Ureteral stent placed on left due to hydronephrosis. Started weekly carbo/taxol chemotherapy 6/20 and CA125 came down from 1300 to 13 in 10/20 after 6 cycles.  CT scan in January 2021 showed good response with residual 2 cm mass in pelvis and CA125 stable at 13. CA125 now 96 and CT scan shows new liver metastases.   Left ureteral stent changed by Dr Kandee Keen 01/08/19 and retrograde showed persistent stricture of distal ureter.  Plan to change again in 4-5 months.   She has had some occasional UTIs and bladder irritative symptoms.  CT scan 1/20 shows low lying stent.     No vulvar irritation at this time.   Urge incontinence s/p surgery for SUI in the past.  Now uses Mirabegron 50 mg qd (a B-agonist) for urge incontinence per Dr Buel Ream suggestion with good results and no symptoms.   Plan:   Continue planned second line doxil until progression of disease or unacceptable toxicity per Dr. Janese Banks.   Awaiting results of somatic testing to see if she is a good candidate for a PARP inhibitor. Germline testing was negative.   Return to clinic in 3 months for repeat pelvic exam.   Beckey Rutter, DNP, AGNP-C [place] [facility] [phone] (clinic)  I personally interviewed and examined the patient. Agreed with the above/below plan of care. I have directly contributed to assessment and plan of care of this patient and educated and discussed with patient and family.  Mellody Drown, MD

## 2019-10-07 NOTE — Progress Notes (Signed)
Pt having side effects of chemo-fatigue, tired , weak are her words. She is eating and drinking good. She continues to have urinary issues and uses 8-10 pads a day-part of it leakage and then urinate on the way to bathroom.. her labia is getting irritated also

## 2019-10-08 ENCOUNTER — Telehealth: Payer: Self-pay | Admitting: *Deleted

## 2019-10-08 NOTE — Telephone Encounter (Signed)
Natalie Stone called to ask about if pt can take the fosfomycin tromethamine along with her doxil tx. I spoke to rao and we put both meds in for possible interaction and there is no interactions known and she can take it while taking doxil

## 2019-10-09 ENCOUNTER — Telehealth: Payer: Self-pay

## 2019-10-09 NOTE — Telephone Encounter (Signed)
Received call back from [facility]. After multiple failed testing attempts on original specimen from [place], they are sending official notification of the failed testing. Order sent on 08/07/2019 for specimen collected 06/18/2012, right tube and ovary, [id number]. Patient will not be billed for these services.

## 2019-10-12 NOTE — Telephone Encounter (Signed)
Spoke to [place] and pt had a hysterectomy and she will try getting another sample to run foundation on.

## 2019-10-13 ENCOUNTER — Telehealth: Payer: Self-pay

## 2019-10-13 NOTE — Telephone Encounter (Signed)
Received formal notification from [facility] that previous testing on [id number], collected 06/18/2012 failted. At the rewuest of Dr. Janese Banks, new order sent to [facility] to obtain alternate specimen for testing.

## 2019-10-14 DIAGNOSIS — N390 Urinary tract infection, site not specified: Secondary | ICD-10-CM | POA: Diagnosis not present

## 2019-10-14 DIAGNOSIS — N3941 Urge incontinence: Secondary | ICD-10-CM | POA: Diagnosis not present

## 2019-10-14 DIAGNOSIS — N3281 Overactive bladder: Secondary | ICD-10-CM | POA: Diagnosis not present

## 2019-10-14 DIAGNOSIS — B373 Candidiasis of vulva and vagina: Secondary | ICD-10-CM | POA: Diagnosis not present

## 2019-10-14 DIAGNOSIS — R8290 Unspecified abnormal findings in urine: Secondary | ICD-10-CM | POA: Diagnosis not present

## 2019-10-16 ENCOUNTER — Inpatient Hospital Stay: Payer: Medicare HMO

## 2019-10-16 ENCOUNTER — Inpatient Hospital Stay (HOSPITAL_BASED_OUTPATIENT_CLINIC_OR_DEPARTMENT_OTHER): Payer: Medicare HMO | Admitting: Oncology

## 2019-10-16 ENCOUNTER — Other Ambulatory Visit: Payer: Self-pay

## 2019-10-16 ENCOUNTER — Encounter: Payer: Self-pay | Admitting: Oncology

## 2019-10-16 VITALS — BP 118/54 | HR 109 | Temp 97.5°F | Resp 16 | Wt 108.9 lb

## 2019-10-16 DIAGNOSIS — R5383 Other fatigue: Secondary | ICD-10-CM | POA: Diagnosis not present

## 2019-10-16 DIAGNOSIS — Z5111 Encounter for antineoplastic chemotherapy: Secondary | ICD-10-CM | POA: Diagnosis not present

## 2019-10-16 DIAGNOSIS — E039 Hypothyroidism, unspecified: Secondary | ICD-10-CM | POA: Diagnosis not present

## 2019-10-16 DIAGNOSIS — C561 Malignant neoplasm of right ovary: Secondary | ICD-10-CM | POA: Diagnosis not present

## 2019-10-16 DIAGNOSIS — C786 Secondary malignant neoplasm of retroperitoneum and peritoneum: Secondary | ICD-10-CM | POA: Diagnosis not present

## 2019-10-16 DIAGNOSIS — Z5189 Encounter for other specified aftercare: Secondary | ICD-10-CM | POA: Diagnosis not present

## 2019-10-16 DIAGNOSIS — C569 Malignant neoplasm of unspecified ovary: Secondary | ICD-10-CM

## 2019-10-16 DIAGNOSIS — E860 Dehydration: Secondary | ICD-10-CM

## 2019-10-16 DIAGNOSIS — C787 Secondary malignant neoplasm of liver and intrahepatic bile duct: Secondary | ICD-10-CM | POA: Diagnosis not present

## 2019-10-16 DIAGNOSIS — C563 Malignant neoplasm of bilateral ovaries: Secondary | ICD-10-CM

## 2019-10-16 DIAGNOSIS — Z95828 Presence of other vascular implants and grafts: Secondary | ICD-10-CM

## 2019-10-16 DIAGNOSIS — C562 Malignant neoplasm of left ovary: Secondary | ICD-10-CM

## 2019-10-16 DIAGNOSIS — I2510 Atherosclerotic heart disease of native coronary artery without angina pectoris: Secondary | ICD-10-CM | POA: Diagnosis not present

## 2019-10-16 DIAGNOSIS — R531 Weakness: Secondary | ICD-10-CM | POA: Diagnosis not present

## 2019-10-16 LAB — COMPREHENSIVE METABOLIC PANEL
ALT: 21 U/L (ref 0–44)
AST: 25 U/L (ref 15–41)
Albumin: 3.7 g/dL (ref 3.5–5.0)
Alkaline Phosphatase: 108 U/L (ref 38–126)
Anion gap: 11 (ref 5–15)
BUN: 27 mg/dL — ABNORMAL HIGH (ref 8–23)
CO2: 26 mmol/L (ref 22–32)
Calcium: 9.1 mg/dL (ref 8.9–10.3)
Chloride: 102 mmol/L (ref 98–111)
Creatinine, Ser: 0.97 mg/dL (ref 0.44–1.00)
GFR calc Af Amer: 60 mL/min — ABNORMAL LOW (ref >60)
GFR calc non Af Amer: 51 mL/min — ABNORMAL LOW (ref >60)
Glucose, Bld: 183 mg/dL — ABNORMAL HIGH (ref 70–99)
Potassium: 4 mmol/L (ref 3.5–5.1)
Sodium: 139 mmol/L (ref 135–145)
Total Bilirubin: 0.4 mg/dL (ref 0.3–1.2)
Total Protein: 7.2 g/dL (ref 6.5–8.1)

## 2019-10-16 LAB — CBC WITH DIFFERENTIAL/PLATELET
Abs Immature Granulocytes: 0.07 10*3/uL (ref 0.00–0.07)
Basophils Absolute: 0.1 10*3/uL (ref 0.0–0.1)
Basophils Relative: 1 %
Eosinophils Absolute: 0.1 10*3/uL (ref 0.0–0.5)
Eosinophils Relative: 2 %
HCT: 37.6 % (ref 36.0–46.0)
Hemoglobin: 12.5 g/dL (ref 12.0–15.0)
Immature Granulocytes: 1 %
Lymphocytes Relative: 21 %
Lymphs Abs: 1.5 10*3/uL (ref 0.7–4.0)
MCH: 30.1 pg (ref 26.0–34.0)
MCHC: 33.2 g/dL (ref 30.0–36.0)
MCV: 90.6 fL (ref 80.0–100.0)
Monocytes Absolute: 0.3 10*3/uL (ref 0.1–1.0)
Monocytes Relative: 4 %
Neutro Abs: 5 10*3/uL (ref 1.7–7.7)
Neutrophils Relative %: 71 %
Platelets: 315 10*3/uL (ref 150–400)
RBC: 4.15 MIL/uL (ref 3.87–5.11)
RDW: 13.7 % (ref 11.5–15.5)
WBC: 7.1 10*3/uL (ref 4.0–10.5)
nRBC: 0 % (ref 0.0–0.2)

## 2019-10-16 MED ORDER — HEPARIN SOD (PORK) LOCK FLUSH 100 UNIT/ML IV SOLN
500.0000 [IU] | Freq: Once | INTRAVENOUS | Status: AC
  Administered 2019-10-16: 500 [IU] via INTRAVENOUS
  Filled 2019-10-16: qty 5

## 2019-10-16 MED ORDER — SODIUM CHLORIDE 0.9 % IV SOLN
INTRAVENOUS | Status: AC
  Filled 2019-10-16: qty 250

## 2019-10-16 MED ORDER — SODIUM CHLORIDE 0.9% FLUSH
10.0000 mL | INTRAVENOUS | Status: DC | PRN
  Administered 2019-10-16: 10 mL via INTRAVENOUS
  Filled 2019-10-16: qty 10

## 2019-10-16 MED ORDER — HEPARIN SOD (PORK) LOCK FLUSH 100 UNIT/ML IV SOLN
INTRAVENOUS | Status: AC
  Filled 2019-10-16: qty 5

## 2019-10-16 NOTE — Progress Notes (Signed)
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 She underwent neoadjuvant chemotherapy followed by surgery with Dr. Beverly Milch followed by adjuvant chemotherapy. Genetic testing was negative. She then established care at [facility] in 2017. More recently patient was found to have a consistently increasing Ca1 25 level from 42.6 in December 20 18-781 in February 2020. Most recent Ca1 25 was 02/21/2005 on 07/06/2018. Scans were however negative for any recurrence. Patient had CT abdomen and pelvis with contrast done on 07/06/2018 which showed severe left hydro ureteral nephrosis. Sigmoid wall thickening differential includes carcinomatosis and colitis. She did had a repeat scan on 07/09/2018 which showed interval increase in ascites mostly within the pelvis with associated enhancement of the peritoneum suggesting carcinomatosis. Redemonstration of sigmoid wall thickening which may represent colitis versus carcinomatosis. Delayed left nephrogram with severe left hydro-ureteronephrosis similar to prior.  Given findings of colitis as well as ongoing symptoms of melena and diarrhea patient underwent EGD as well as flexible sigmoidoscopy which showed Schatzki's ring and diverticulosis but no evidence of tumor. Her symptoms are possibly secondary to carcinomatosis. She also had other stool studies done including C.  difficile and stool cultures which were negative.Patient did undergo a diagnostic tap of her ascites on 07/09/2018 which was consistent with adenocarcinoma  Patient started cycle 1 with single agent carboplatin on 07/28/2018 and finished 18cycles of weekly carbotaxol on 11/28/2018  Scans in June 2021 showed rising tumor markers and liver metastases. Plan is to switch her to second line Doxil and addcarboplatin if tolerated. Foundation 1 liquid biopsy did not show any evidence of actionable mutations. PD-L1 0%  Interval history-patient reports tolerating chemotherapy well.  Did not have any significant nausea or vomiting.  Oral intake has been down a little bit.  She continues to have problems of overflow incontinence.  She was seen by urogynecology at [facility] and prescribed Myrbetriq in addition to topical antifungals.  ECOG PS- 2 Pain scale- 0   Review of systems- Review of Systems  Constitutional: Positive for malaise/fatigue. Negative for chills, fever and weight loss.  HENT: Negative for congestion, ear discharge and nosebleeds.   Eyes: Negative for blurred vision.  Respiratory: Negative for cough, hemoptysis, sputum production, shortness of breath and wheezing.   Cardiovascular: Negative for chest pain, palpitations, orthopnea and claudication.  Gastrointestinal: Negative for abdominal pain, blood in stool, constipation, diarrhea, heartburn, melena, nausea and vomiting.  Genitourinary: Negative for dysuria, flank pain, frequency, hematuria and urgency.       Incontinence  Musculoskeletal: Negative for back pain, joint pain and myalgias.  Skin: Negative for rash.  Neurological: Negative for dizziness, tingling, focal weakness, seizures, weakness and headaches.  Endo/Heme/Allergies: Does not bruise/bleed easily.  Psychiatric/Behavioral: Negative for depression and suicidal ideas. The patient does not have insomnia.        Allergies  Allergen Reactions  . Penicillins Anaphylaxis     Has patient had a PCN reaction causing immediate rash, facial/tongue/throat swelling, SOB or lightheadedness with  hypotension: Yes Has patient had a PCN reaction causing severe rash involving mucus membranes or skin necrosis: No Has patient had a PCN reaction that required hospitalization No Has patient had a PCN reaction occurring within the last 10 years: No If all of the above answers are "NO", then may proceed with Cephalosporin use.     Past Medical History:  Diagnosis Date  . CAD (coronary artery disease)   . Chronic kidney disease    urinary incontinence  . Hyperthyroidism   . Hypothyroidism (acquired)   . Ovarian cancer ([place]) 2014  . Shingles   . Third degree AV block ([place]) 10/09/2015     Past Surgical History:  Procedure Laterality Date  . ABDOMINAL HYSTERECTOMY    . BLADDER REPAIR    . CORONARY ANGIOPLASTY WITH STENT PLACEMENT  2001   Dr. Isaiah Blakes in [place]  . CYSTOURETHROSCOPY, WITH INSERTION OF INDWELLING URETERAL STENT (EG,GIBBONS OR DOUBLE-J TYPE) (Left Ureter Left   . PACEMAKER INSERTION N/A 10/11/2015   Procedure: INSERTION PACEMAKER;  Surgeon: [name], MD;  Location: [facility] ORS;  Service: Cardiovascular;  Laterality: N/A;  . PARTIAL HYSTERECTOMY    . PORTA CATH INSERTION N/A 07/24/2018   Procedure: PORTA CATH INSERTION;  Surgeon: Algernon Huxley, MD;  Location: [place] [facility];  Service: Cardiovascular;  Laterality: N/A;    Social History   Socioeconomic History  . Marital status: Widowed    Spouse name: Not on file  . Number of children: Not on file  . Years of education: Not on file  . Highest education level: Not on file  Occupational History  . Not on file  Tobacco Use  . Smoking status: Never Smoker  . Smokeless tobacco: Never Used  Vaping Use  . Vaping Use: Never used  Substance and Sexual Activity  . Alcohol use: No  . Drug use: No  . Sexual activity: Never  Other Topics Concern  . 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 Difficulty of Paying Living Expenses: Not on file  Food Insecurity:   . Worried About Charity fundraiser in the Last Year: Not on file  . Ran Out of Food in the Last Year: Not on file  Transportation Needs:   . Lack of Transportation (Medical): Not on file  . Lack of Transportation (Non-Medical): Not on file  Physical Activity:   . Days of Exercise per Week: Not on file  . Minutes of Exercise per Session: Not on file  Stress:   . Feeling of Stress : Not on file  Social Connections:   . Frequency of Communication with Friends and Family: Not on file  . Frequency of Social Gatherings with Friends and Family: Not on file  . Attends Religious Services: Not on file  . Active Member of Clubs or Organizations: Not on file  . Attends Archivist Meetings: Not on file  . Marital Status: Not on file  Intimate Partner Violence:   . Fear of Current or Ex-Partner: Not on file  . Emotionally Abused: Not on file  . Physically Abused: Not on file  . Sexually Abused: Not on file    Family History  Problem Relation Age of Onset  . Diabetes Brother      Current Outpatient Medications:  .  AMERICAN GINSENG PO, Take 2 tablets by mouth  daily. 500 mg tab and she takes 2 a day, Disp: , Rfl:  .  aspirin EC 81 MG tablet, Take 81 mg by mouth daily., Disp: , Rfl:  .  atorvastatin (LIPITOR) 10 MG tablet, Take 10 mg by mouth at bedtime. , Disp: , Rfl:  .  Bioflavonoid Products (ESTER C PO), Take 500 mg by mouth daily. , Disp: , Rfl:  .  Calcium Carbonate-Vitamin D (CALTRATE 600+D PO), Take 1 tablet by mouth daily., Disp: , Rfl:  .  Cholecalciferol (VITAMIN D3) 1000 units CAPS, Take 1,000 Units by mouth daily. , Disp: , Rfl:  .  Coenzyme Q10 (COQ10) 100 MG CAPS, Take 100 mg by mouth daily. , Disp: , Rfl:  .  Cyanocobalamin (VITAMIN B-12) 1000 MCG/15ML LIQD, Take 1  mL by mouth daily., Disp: , Rfl:  .  D-MANNOSE PO, Take 1 tablet by mouth daily., Disp: , Rfl:  .  dexamethasone (DECADRON) 4 MG tablet, Take 2 tablets by mouth once a day for 3 days after chemo. Take with food., Disp: 30 tablet, Rfl: 1 .  Digestive Enzymes (DIGESTIVE ENZYME PO), Take 1 Dose by mouth 3 (three) times daily before meals., Disp: , Rfl:  .  Glucosamine-MSM-Hyaluronic Acd (JOINT HEALTH PO), Take 1 tablet by mouth 2 (two) times a day. , Disp: , Rfl:  .  hydrocortisone 2.5 % cream, Apply 1 application topically daily as needed. , Disp: , Rfl:  .  levothyroxine (SYNTHROID) 100 MCG tablet, Take 100 mcg by mouth daily before breakfast. , Disp: , Rfl:  .  mirtazapine (REMERON) 7.5 MG tablet, Take 7.5 mg by mouth at bedtime., Disp: , Rfl:  .  ondansetron (ZOFRAN) 4 MG tablet, TAKE 1 TABLET(4 MG) BY MOUTH EVERY 8 HOURS AS NEEDED FOR NAUSEA OR VOMITING (Patient not taking: Reported on 08/07/2019), Disp: 90 tablet, Rfl: 0 .  ondansetron (ZOFRAN) 8 MG tablet, Take 1 tablet (8 mg total) by mouth 2 (two) times daily as needed. Start on the third day after chemotherapy., Disp: 30 tablet, Rfl: 1 .  prochlorperazine (COMPAZINE) 10 MG tablet, Take 1 tablet (10 mg total) by mouth every 6 (six) hours as needed (Nausea or vomiting)., Disp: 30 tablet, Rfl: 1 .  Zinc 25 MG TABS, Take 1 tablet by mouth., Disp: , Rfl:  No current facility-administered medications for this visit.  Facility-Administered Medications Ordered in Other Visits:  .  sodium chloride flush (NS) 0.9 % injection 10 mL, 10 mL, Intravenous, PRN, Sindy Guadeloupe, MD  Physical exam:  Vitals:   10/16/19 0917  BP: (!) 118/54  Pulse: (!) 109  Resp: 16  Temp: (!) 97.5 F (36.4 C)  TempSrc: Tympanic  SpO2: 99%  Weight: 108 lb 14.4 oz (49.4 kg)   Physical Exam Constitutional:      General: She is not in acute distress. Pulmonary:     Effort: Pulmonary effort is normal.  Abdominal:     General: Bowel sounds are normal.      Palpations: Abdomen is soft.  Musculoskeletal:     Right lower leg: No edema.     Left lower leg: No edema.  Skin:    General: Skin is warm and dry.  Neurological:     Mental Status: She is alert and oriented to person, place, and time.      CMP Latest Ref Rng & Units 10/16/2019  Glucose 70 - 99 mg/dL 183(H)  BUN 8 - 23 mg/dL 27(H)  Creatinine 0.44 - 1.00 mg/dL 0.97  Sodium 135 - 145 mmol/L 139  Potassium 3.5 - 5.1 mmol/L 4.0  Chloride 98 - 111 mmol/L 102  CO2 22 - 32 mmol/L 26  Calcium 8.9 - 10.3 mg/dL 9.1  Total Protein 6.5 - 8.1 g/dL 7.2  Total Bilirubin 0.3 - 1.2 mg/dL 0.4  Alkaline Phos 38 - 126 U/L 108  AST 15 - 41 U/L 25  ALT 0 - 44 U/L 21   CBC Latest Ref Rng & Units 10/16/2019  WBC 4.0 - 10.5 K/uL 7.1  Hemoglobin 12.0 - 15.0 g/dL 12.5  Hematocrit 36 - 46 % 37.6  Platelets 150 - 400 K/uL 315    Assessment and plan- Patient is a 86 y.o. 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  She is s/p 1 cycle of Doxil and here for post chemo toxicity check  2 weeks after chemotherapy her counts remained stable and no significant cytopenias.  She did receive Neulasta with chemotherapy as well and received doxorubicin at a reduced dose of 30 mg per metered square.  She will receive 1 L of IV fluids today.  I will see her back on 10/29/2019 with CBC with differential, CMP and CA-125 for cycle 2 of Doxil chemotherapy  Urinary incontinence: On myobetriq and topical antifungals Visit Diagnosis 1. Encounter for antineoplastic chemotherapy   2. Malignant neoplasm of both ovaries ([place])      Dr. Randa Evens, MD, MPH [facility] at [facility] [phone] 10/16/2019 9:42 AM

## 2019-10-20 ENCOUNTER — Telehealth: Payer: Self-pay

## 2019-10-21 DIAGNOSIS — N131 Hydronephrosis with ureteral stricture, not elsewhere classified: Secondary | ICD-10-CM | POA: Diagnosis not present

## 2019-10-21 NOTE — Telephone Encounter (Signed)
Message was left on voicemail, stating the need for documentation of patients medical status, concerns was addressed with Dr. Janese Banks and a letter has been generated for patient's daughter to pick up or mail out

## 2019-10-29 ENCOUNTER — Encounter: Payer: Self-pay | Admitting: *Deleted

## 2019-10-29 ENCOUNTER — Other Ambulatory Visit: Payer: Self-pay

## 2019-10-29 ENCOUNTER — Inpatient Hospital Stay (HOSPITAL_BASED_OUTPATIENT_CLINIC_OR_DEPARTMENT_OTHER): Payer: Medicare HMO | Admitting: Oncology

## 2019-10-29 ENCOUNTER — Inpatient Hospital Stay: Payer: Medicare HMO

## 2019-10-29 ENCOUNTER — Inpatient Hospital Stay: Payer: Medicare HMO | Attending: Oncology

## 2019-10-29 ENCOUNTER — Other Ambulatory Visit: Payer: Self-pay | Admitting: *Deleted

## 2019-10-29 ENCOUNTER — Encounter: Payer: Self-pay | Admitting: Oncology

## 2019-10-29 VITALS — HR 90

## 2019-10-29 VITALS — BP 101/50 | HR 113 | Temp 98.6°F | Resp 16 | Ht 61.0 in | Wt 107.1 lb

## 2019-10-29 DIAGNOSIS — C562 Malignant neoplasm of left ovary: Secondary | ICD-10-CM

## 2019-10-29 DIAGNOSIS — R110 Nausea: Secondary | ICD-10-CM | POA: Insufficient documentation

## 2019-10-29 DIAGNOSIS — R630 Anorexia: Secondary | ICD-10-CM

## 2019-10-29 DIAGNOSIS — E876 Hypokalemia: Secondary | ICD-10-CM | POA: Diagnosis not present

## 2019-10-29 DIAGNOSIS — Z79899 Other long term (current) drug therapy: Secondary | ICD-10-CM | POA: Diagnosis not present

## 2019-10-29 DIAGNOSIS — Z5189 Encounter for other specified aftercare: Secondary | ICD-10-CM | POA: Insufficient documentation

## 2019-10-29 DIAGNOSIS — E860 Dehydration: Secondary | ICD-10-CM | POA: Diagnosis not present

## 2019-10-29 DIAGNOSIS — A0472 Enterocolitis due to Clostridium difficile, not specified as recurrent: Secondary | ICD-10-CM | POA: Diagnosis not present

## 2019-10-29 DIAGNOSIS — C569 Malignant neoplasm of unspecified ovary: Secondary | ICD-10-CM | POA: Insufficient documentation

## 2019-10-29 DIAGNOSIS — I2510 Atherosclerotic heart disease of native coronary artery without angina pectoris: Secondary | ICD-10-CM | POA: Insufficient documentation

## 2019-10-29 DIAGNOSIS — Z7689 Persons encountering health services in other specified circumstances: Secondary | ICD-10-CM | POA: Insufficient documentation

## 2019-10-29 DIAGNOSIS — C561 Malignant neoplasm of right ovary: Secondary | ICD-10-CM

## 2019-10-29 DIAGNOSIS — Z5111 Encounter for antineoplastic chemotherapy: Secondary | ICD-10-CM | POA: Diagnosis not present

## 2019-10-29 DIAGNOSIS — E46 Unspecified protein-calorie malnutrition: Secondary | ICD-10-CM | POA: Insufficient documentation

## 2019-10-29 DIAGNOSIS — I442 Atrioventricular block, complete: Secondary | ICD-10-CM | POA: Insufficient documentation

## 2019-10-29 DIAGNOSIS — N189 Chronic kidney disease, unspecified: Secondary | ICD-10-CM | POA: Diagnosis not present

## 2019-10-29 DIAGNOSIS — Z87440 Personal history of urinary (tract) infections: Secondary | ICD-10-CM | POA: Insufficient documentation

## 2019-10-29 DIAGNOSIS — R197 Diarrhea, unspecified: Secondary | ICD-10-CM | POA: Diagnosis not present

## 2019-10-29 DIAGNOSIS — C787 Secondary malignant neoplasm of liver and intrahepatic bile duct: Secondary | ICD-10-CM | POA: Insufficient documentation

## 2019-10-29 DIAGNOSIS — Z950 Presence of cardiac pacemaker: Secondary | ICD-10-CM | POA: Diagnosis not present

## 2019-10-29 DIAGNOSIS — E039 Hypothyroidism, unspecified: Secondary | ICD-10-CM | POA: Diagnosis not present

## 2019-10-29 DIAGNOSIS — C786 Secondary malignant neoplasm of retroperitoneum and peritoneum: Secondary | ICD-10-CM | POA: Diagnosis not present

## 2019-10-29 DIAGNOSIS — I951 Orthostatic hypotension: Secondary | ICD-10-CM | POA: Diagnosis not present

## 2019-10-29 DIAGNOSIS — R5383 Other fatigue: Secondary | ICD-10-CM | POA: Insufficient documentation

## 2019-10-29 DIAGNOSIS — C563 Malignant neoplasm of bilateral ovaries: Secondary | ICD-10-CM

## 2019-10-29 LAB — COMPREHENSIVE METABOLIC PANEL
ALT: 16 U/L (ref 0–44)
AST: 23 U/L (ref 15–41)
Albumin: 3.5 g/dL (ref 3.5–5.0)
Alkaline Phosphatase: 84 U/L (ref 38–126)
Anion gap: 13 (ref 5–15)
BUN: 30 mg/dL — ABNORMAL HIGH (ref 8–23)
CO2: 25 mmol/L (ref 22–32)
Calcium: 9.2 mg/dL (ref 8.9–10.3)
Chloride: 100 mmol/L (ref 98–111)
Creatinine, Ser: 1.08 mg/dL — ABNORMAL HIGH (ref 0.44–1.00)
GFR calc Af Amer: 52 mL/min — ABNORMAL LOW (ref >60)
GFR calc non Af Amer: 45 mL/min — ABNORMAL LOW (ref >60)
Glucose, Bld: 219 mg/dL — ABNORMAL HIGH (ref 70–99)
Potassium: 4 mmol/L (ref 3.5–5.1)
Sodium: 138 mmol/L (ref 135–145)
Total Bilirubin: 0.5 mg/dL (ref 0.3–1.2)
Total Protein: 7.1 g/dL (ref 6.5–8.1)

## 2019-10-29 LAB — CBC WITH DIFFERENTIAL/PLATELET
Abs Immature Granulocytes: 0.06 10*3/uL (ref 0.00–0.07)
Basophils Absolute: 0.1 10*3/uL (ref 0.0–0.1)
Basophils Relative: 1 %
Eosinophils Absolute: 0.1 10*3/uL (ref 0.0–0.5)
Eosinophils Relative: 1 %
HCT: 39.3 % (ref 36.0–46.0)
Hemoglobin: 13 g/dL (ref 12.0–15.0)
Immature Granulocytes: 1 %
Lymphocytes Relative: 13 %
Lymphs Abs: 1.5 10*3/uL (ref 0.7–4.0)
MCH: 29.8 pg (ref 26.0–34.0)
MCHC: 33.1 g/dL (ref 30.0–36.0)
MCV: 90.1 fL (ref 80.0–100.0)
Monocytes Absolute: 1.1 10*3/uL — ABNORMAL HIGH (ref 0.1–1.0)
Monocytes Relative: 9 %
Neutro Abs: 8.7 10*3/uL — ABNORMAL HIGH (ref 1.7–7.7)
Neutrophils Relative %: 75 %
Platelets: 352 10*3/uL (ref 150–400)
RBC: 4.36 MIL/uL (ref 3.87–5.11)
RDW: 14.3 % (ref 11.5–15.5)
WBC: 11.6 10*3/uL — ABNORMAL HIGH (ref 4.0–10.5)
nRBC: 0 % (ref 0.0–0.2)

## 2019-10-29 MED ORDER — DOXORUBICIN HCL LIPOSOMAL CHEMO INJECTION 2 MG/ML
30.0000 mg/m2 | Freq: Once | INTRAVENOUS | Status: AC
  Administered 2019-10-29: 44 mg via INTRAVENOUS
  Filled 2019-10-29: qty 22

## 2019-10-29 MED ORDER — HEPARIN SOD (PORK) LOCK FLUSH 100 UNIT/ML IV SOLN
500.0000 [IU] | Freq: Once | INTRAVENOUS | Status: AC | PRN
  Administered 2019-10-29: 500 [IU]
  Filled 2019-10-29: qty 5

## 2019-10-29 MED ORDER — SODIUM CHLORIDE 0.9 % IV SOLN
Freq: Once | INTRAVENOUS | Status: AC
  Filled 2019-10-29: qty 250

## 2019-10-29 MED ORDER — SODIUM CHLORIDE 0.9 % IV SOLN
10.0000 mg | Freq: Once | INTRAVENOUS | Status: AC
  Administered 2019-10-29: 10 mg via INTRAVENOUS
  Filled 2019-10-29: qty 10

## 2019-10-29 MED ORDER — SODIUM CHLORIDE 0.9 % IV SOLN
150.0000 mg | Freq: Once | INTRAVENOUS | Status: AC
  Administered 2019-10-29: 150 mg via INTRAVENOUS
  Filled 2019-10-29: qty 150

## 2019-10-29 MED ORDER — DEXTROSE 5 % IV SOLN
Freq: Once | INTRAVENOUS | Status: AC
  Filled 2019-10-29: qty 250

## 2019-10-29 MED ORDER — SODIUM CHLORIDE 0.9% FLUSH
10.0000 mL | Freq: Once | INTRAVENOUS | Status: AC
  Administered 2019-10-29: 10 mL via INTRAVENOUS
  Filled 2019-10-29: qty 10

## 2019-10-29 MED ORDER — PEGFILGRASTIM 6 MG/0.6ML ~~LOC~~ PSKT
6.0000 mg | PREFILLED_SYRINGE | Freq: Once | SUBCUTANEOUS | Status: AC
  Administered 2019-10-29: 6 mg via SUBCUTANEOUS
  Filled 2019-10-29: qty 0.6

## 2019-10-29 MED ORDER — SODIUM CHLORIDE 0.9 % IV SOLN
INTRAVENOUS | Status: AC
  Filled 2019-10-29: qty 250

## 2019-10-29 MED ORDER — OLANZAPINE 10 MG PO TABS: 10.0000 mg | ORAL_TABLET | Freq: Every day | ORAL | 0 refills | Status: DC

## 2019-10-29 NOTE — Progress Notes (Signed)
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 She underwent neoadjuvant chemotherapy followed by surgery with Dr. Beverly Milch followed by adjuvant chemotherapy. Genetic testing was negative. She then established care at [facility] in 2017. More recently patient was found to have a consistently increasing Ca1 25 level from 42.6 in December 20 18-781 in February 2020. Most recent Ca1 25 was 02/21/2005 on 07/06/2018. Scans were however negative for any recurrence. Patient had CT abdomen and pelvis with contrast done on 07/06/2018 which showed severe left hydro ureteral nephrosis. Sigmoid wall thickening differential includes carcinomatosis and colitis. She did had a repeat scan on 07/09/2018 which showed interval increase in ascites mostly within the pelvis with associated enhancement of the peritoneum suggesting carcinomatosis. Redemonstration of sigmoid wall thickening which may represent colitis versus carcinomatosis. Delayed left nephrogram with severe left hydro-ureteronephrosis similar to prior.  Given findings of colitis as well as ongoing symptoms of melena and diarrhea patient underwent EGD as well as flexible sigmoidoscopy which showed Schatzki's ring and diverticulosis but no evidence of tumor. Her symptoms are possibly secondary to carcinomatosis. She also had other stool studies done  including C. difficile and stool cultures which were negative.Patient did undergo a diagnostic tap of her ascites on 07/09/2018 which was consistent with adenocarcinoma  Patient started cycle 1 with single agent carboplatin on 07/28/2018 and finished 18cycles of weekly carbotaxol on 11/28/2018  Scans in June 2021 showed rising tumor markers and liver metastases. Plan is to switch her to second line Doxil and addcarboplatin if tolerated.Foundation 1 liquid biopsy did not show any evidence of actionable mutations.PD-L1 0%   Interval history-reports feeling fatigued.  She just recovered from another episode of UTI.  Appetite is down and reports occasional nausea.  Denies any pain  ECOG PS- 2 Pain scale- 0   Review of systems- Review of Systems  Constitutional: Positive for malaise/fatigue. Negative for chills, fever and weight loss.       Lack of appetite  HENT: Negative for congestion, ear discharge and nosebleeds.   Eyes: Negative for blurred vision.  Respiratory: Negative for cough, hemoptysis, sputum production, shortness of breath and wheezing.   Cardiovascular: Negative for chest pain, palpitations, orthopnea and claudication.  Gastrointestinal: Negative for abdominal pain, blood in stool, constipation, diarrhea, heartburn, melena, nausea and vomiting.  Genitourinary: Negative for dysuria, flank pain, frequency, hematuria and urgency.  Musculoskeletal: Negative for back pain, joint pain and myalgias.  Skin: Negative for rash.  Neurological: Negative for dizziness, tingling, focal weakness, seizures, weakness and headaches.  Endo/Heme/Allergies: Does not bruise/bleed easily.  Psychiatric/Behavioral: Negative for depression and suicidal ideas. The patient does not have insomnia.       Allergies  Allergen Reactions  . Penicillins Anaphylaxis    Has patient had a PCN reaction causing immediate rash, facial/tongue/throat swelling, SOB or lightheadedness with hypotension: Yes Has  patient had a PCN reaction causing severe rash involving mucus membranes or skin necrosis: No Has patient had  a PCN reaction that required hospitalization No Has patient had a PCN reaction occurring within the last 10 years: No If all of the above answers are "NO", then may proceed with Cephalosporin use.     Past Medical History:  Diagnosis Date  . CAD (coronary artery disease)   . Chronic kidney disease    urinary incontinence  . Hyperthyroidism   . Hypothyroidism (acquired)   . Ovarian cancer ([place]) 2014  . Shingles   . Third degree AV block ([place]) 10/09/2015  . UTI (urinary tract infection)      Past Surgical History:  Procedure Laterality Date  . ABDOMINAL HYSTERECTOMY    . BLADDER REPAIR    . CORONARY ANGIOPLASTY WITH STENT PLACEMENT  2001   Dr. Isaiah Blakes in [place]  . CYSTOURETHROSCOPY, WITH INSERTION OF INDWELLING URETERAL STENT (EG,GIBBONS OR DOUBLE-J TYPE) (Left Ureter Left   . PACEMAKER INSERTION N/A 10/11/2015   Procedure: INSERTION PACEMAKER;  Surgeon: [name], MD;  Location: [facility] ORS;  Service: Cardiovascular;  Laterality: N/A;  . PARTIAL HYSTERECTOMY    . PORTA CATH INSERTION N/A 07/24/2018   Procedure: PORTA CATH INSERTION;  Surgeon: Algernon Huxley, MD;  Location: [place] [facility];  Service: Cardiovascular;  Laterality: N/A;    Social History   Socioeconomic History  . Marital status: Widowed    Spouse name: Not on file  . Number of children: Not on file  . Years of education: Not on file  . Highest education level: Not on file  Occupational History  . Not on file  Tobacco Use  . Smoking status: Never Smoker  . Smokeless tobacco: Never Used  Vaping Use  . Vaping Use: Never used  Substance and Sexual Activity  . Alcohol use: No  . Drug use: No  . Sexual activity: Never  Other Topics Concern  . 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 Difficulty of Paying Living Expenses: Not on file  Food Insecurity:   . Worried About Charity fundraiser in the Last Year: Not on file  . Ran Out of Food in the Last Year: Not on file  Transportation Needs:   . Lack of Transportation (Medical): Not on file  . Lack of Transportation (Non-Medical): Not on file  Physical Activity:   . Days of Exercise per Week: Not on file  . Minutes of Exercise per Session: Not on file  Stress:   . Feeling of Stress : Not on file  Social Connections:   . Frequency of Communication with Friends and Family: Not on file  . Frequency of Social Gatherings with Friends and Family: Not on file  . Attends Religious Services: Not on file  . Active Member of Clubs or Organizations: Not on file  . Attends Archivist Meetings: Not on file  . Marital Status: Not on file  Intimate Partner Violence:   . Fear of Current or Ex-Partner: Not on file  . Emotionally Abused: Not on file  . Physically Abused: Not on file  . Sexually Abused: Not on file    Family History  Problem Relation Age of Onset  . Diabetes Brother      Current Outpatient Medications:  .  AMERICAN GINSENG PO, Take 2 tablets by mouth daily. 500 mg tab and she takes 2 a day, Disp: , Rfl:  .  aspirin EC 81 MG tablet, Take 81 mg by mouth daily., Disp: , Rfl:  .  atorvastatin (LIPITOR) 10 MG tablet, Take 10 mg by mouth at bedtime. , Disp: , Rfl:  .  Bioflavonoid Products (ESTER C PO), Take 500 mg by mouth daily. , Disp: , Rfl:  .  Calcium Carbonate-Vitamin D (CALTRATE 600+D PO), Take 1 tablet by mouth daily. , Disp: , Rfl:  .  Cholecalciferol (VITAMIN D3) 1000 units CAPS, Take 1,000 Units by mouth daily. , Disp: , Rfl:  .  Coenzyme Q10 (COQ10) 100 MG CAPS, Take 100 mg by mouth daily. , Disp: , Rfl:  .  Cyanocobalamin (VITAMIN B-12) 1000 MCG/15ML LIQD, Take 1 mL by mouth daily., Disp: , Rfl:  .  D-MANNOSE PO, Take 1 tablet by mouth daily., Disp: , Rfl:   .  dexamethasone (DECADRON) 4 MG tablet, Take 2 tablets by mouth once a day for 3 days after chemo. Take with food., Disp: 30 tablet, Rfl: 1 .  Digestive Enzymes (DIGESTIVE ENZYME PO), Take 1 Dose by mouth 3 (three) times daily before meals., Disp: , Rfl:  .  Glucosamine-MSM-Hyaluronic Acd (JOINT HEALTH PO), Take 1 tablet by mouth 2 (two) times a day. , Disp: , Rfl:  .  hydrocortisone 2.5 % cream, Apply 1 application topically daily as needed. , Disp: , Rfl:  .  levothyroxine (SYNTHROID) 100 MCG tablet, Take 100 mcg by mouth daily before breakfast. , Disp: , Rfl:  .  loratadine (CLARITIN) 10 MG tablet, Take 10 mg by mouth as directed. Around the days of neulasta, Disp: , Rfl:  .  mirabegron ER (MYRBETRIQ) 50 MG TB24 tablet, Take 50 mg by mouth daily. , Disp: , Rfl:  .  mirtazapine (REMERON) 7.5 MG tablet, Take 7.5 mg by mouth at bedtime., Disp: , Rfl:  .  Zinc 25 MG TABS, Take 1 tablet by mouth., Disp: , Rfl:  .  ondansetron (ZOFRAN) 4 MG tablet, TAKE 1 TABLET(4 MG) BY MOUTH EVERY 8 HOURS AS NEEDED FOR NAUSEA OR VOMITING (Patient not taking: Reported on 08/07/2019), Disp: 90 tablet, Rfl: 0 .  ondansetron (ZOFRAN) 8 MG tablet, Take 1 tablet (8 mg total) by mouth 2 (two) times daily as needed. Start on the third day after chemotherapy. (Patient not taking: Reported on 10/16/2019), Disp: 30 tablet, Rfl: 1 .  prochlorperazine (COMPAZINE) 10 MG tablet, Take 1 tablet (10 mg total) by mouth every 6 (six) hours as needed (Nausea or vomiting). (Patient not taking: Reported on 10/16/2019), Disp: 30 tablet, Rfl: 1 No current facility-administered medications for this visit.  Facility-Administered Medications Ordered in Other Visits:  .  DOXOrubicin HCL LIPOSOMAL (DOXIL) 44 mg in dextrose 5 % 250 mL chemo infusion, 30 mg/m2 (Treatment Plan Recorded), Intravenous, Once, Sindy Guadeloupe, MD, Last Rate: 272 mL/hr at 10/29/19 1242, 44 mg at 10/29/19 1242 .  heparin lock flush 100 unit/mL, 500 Units, Intracatheter,  Once PRN, Sindy Guadeloupe, MD .  pegfilgrastim (NEULASTA ONPRO KIT) injection 6 mg, 6 mg, Subcutaneous, Once, Sindy Guadeloupe, MD  Physical exam:  Vitals:   10/29/19 0935  BP: (!) 101/50  Pulse: (!) 113  Resp: 16  Temp: 98.6 F (37 C)  TempSrc: Oral  Weight: 107 lb 1.6 oz (48.6 kg)  Height: $Remove'5\' 1"'YNqCNXh$  (1.549 m)   Physical Exam Constitutional:      Comments: Thin frail elderly woman sitting in a wheelchair.  Appears in no acute distress  Cardiovascular:     Rate and Rhythm:  Regular rhythm. Tachycardia present.     Heart sounds: Normal heart sounds.  Pulmonary:     Effort: Pulmonary effort is normal.     Breath sounds: Normal breath sounds.  Abdominal:     General: Bowel sounds are normal.     Palpations: Abdomen is soft.  Skin:    General: Skin is warm and dry.  Neurological:     Mental Status: She is alert and oriented to person, place, and time.      CMP Latest Ref Rng & Units 10/29/2019  Glucose 70 - 99 mg/dL 219(H)  BUN 8 - 23 mg/dL 30(H)  Creatinine 0.44 - 1.00 mg/dL 1.08(H)  Sodium 135 - 145 mmol/L 138  Potassium 3.5 - 5.1 mmol/L 4.0  Chloride 98 - 111 mmol/L 100  CO2 22 - 32 mmol/L 25  Calcium 8.9 - 10.3 mg/dL 9.2  Total Protein 6.5 - 8.1 g/dL 7.1  Total Bilirubin 0.3 - 1.2 mg/dL 0.5  Alkaline Phos 38 - 126 U/L 84  AST 15 - 41 U/L 23  ALT 0 - 44 U/L 16   CBC Latest Ref Rng & Units 10/29/2019  WBC 4.0 - 10.5 K/uL 11.6(H)  Hemoglobin 12.0 - 15.0 g/dL 13.0  Hematocrit 36 - 46 % 39.3  Platelets 150 - 400 K/uL 352      Assessment and plan- Patient is a 86 y.o. 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  She is here for on treatment assessment prior to cycle 2 of Doxil  Counts okay to proceed with cycle 2 of Doxil with on pro-Neulasta support.  She has just recovered from a urinary tract infection and feels fatigued.  She is tachycardic today.  I will give her 1 L of IV fluids today.  She will call us if she feels further  fatigued/dehydrated over the next 4 weeks.  Otherwise I will see her back in 4 weeks for cycle 3 of Doxil.  Lack of appetite/nausea: Remeron is not helping and I will repeat her to Zyprexa and see if it helps her.  If it still does not help her appetite I will consider adding Megace   Visit Diagnosis 1. Malignant neoplasm of both ovaries ([place])   2. Encounter for antineoplastic chemotherapy   3. Lack of appetite   4. Dehydration      Dr. Randa Evens, MD, MPH [facility] [phone] 10/29/2019 1:37 PM

## 2019-10-29 NOTE — Progress Notes (Signed)
Pt says she has not been good since last tx- she had a UTI and went and saw a gyn uro doctor and she had a fungus on one side of her labia and was given antifungal cream, atb and mybetriq. She has completed the atb. Her heart rate is 113 today and she has not been sleeping good for about 1 week and her appetite has not been good. She states that sometimes after she eats -she feels nauseated. I told her to try to take nausea med 1 hour before meals

## 2019-10-29 NOTE — Progress Notes (Signed)
HR 113, Per Dr. Janese Banks okay to proceed with Doxil treatment, pt to also receive 1 Liter NS over 1 hour per Dr. Janese Banks.  8421: pt tolerated infusion well. No s/s of distress or reaction noted. Pt and VS stable at discharge.

## 2019-10-29 NOTE — Progress Notes (Signed)
HR 113, ok to proceed per MD.  Also continue on emend and dex with doxil only.

## 2019-10-30 LAB — CA 125: Cancer Antigen (CA) 125: 263 U/mL — ABNORMAL HIGH (ref 0.0–38.1)

## 2019-11-02 DIAGNOSIS — N3090 Cystitis, unspecified without hematuria: Secondary | ICD-10-CM | POA: Diagnosis not present

## 2019-11-03 ENCOUNTER — Encounter: Payer: Self-pay | Admitting: Oncology

## 2019-11-03 DIAGNOSIS — C569 Malignant neoplasm of unspecified ovary: Secondary | ICD-10-CM | POA: Diagnosis not present

## 2019-11-06 ENCOUNTER — Other Ambulatory Visit: Payer: Self-pay | Admitting: *Deleted

## 2019-11-06 ENCOUNTER — Telehealth: Payer: Self-pay

## 2019-11-06 ENCOUNTER — Other Ambulatory Visit: Payer: Self-pay | Admitting: Oncology

## 2019-11-06 DIAGNOSIS — C569 Malignant neoplasm of unspecified ovary: Secondary | ICD-10-CM

## 2019-11-06 DIAGNOSIS — R197 Diarrhea, unspecified: Secondary | ICD-10-CM

## 2019-11-06 NOTE — Telephone Encounter (Signed)
Received call from [facility] reporting that Ms. Natalie Stone is having 2-3 loose stools daily. Last chemo was 8 days ago. Per Dr. Janese Banks she can take Imodium and that with her antibiotic use she would like to do stool study for c.diff. [name] is very familiar with cdiff as she has had a previous infection with it. They were instructed to come by the cancer center and pick up stool kit at the front desk.

## 2019-11-10 ENCOUNTER — Telehealth: Payer: Self-pay

## 2019-11-10 NOTE — Telephone Encounter (Signed)
Received call from daughter, [name], to make [place] aware that she did not come by and get stool sample kit. She stated that after we spoke on 9/17, Ms. Minotti did not have any further loose stools.

## 2019-11-13 ENCOUNTER — Telehealth: Payer: Self-pay | Admitting: *Deleted

## 2019-11-13 ENCOUNTER — Inpatient Hospital Stay (HOSPITAL_BASED_OUTPATIENT_CLINIC_OR_DEPARTMENT_OTHER): Payer: Medicare HMO | Admitting: Nurse Practitioner

## 2019-11-13 ENCOUNTER — Inpatient Hospital Stay: Payer: Medicare HMO

## 2019-11-13 ENCOUNTER — Telehealth: Payer: Self-pay

## 2019-11-13 ENCOUNTER — Other Ambulatory Visit: Payer: Self-pay | Admitting: *Deleted

## 2019-11-13 ENCOUNTER — Other Ambulatory Visit: Payer: Self-pay

## 2019-11-13 VITALS — BP 138/59 | HR 98 | Temp 98.8°F | Resp 17

## 2019-11-13 DIAGNOSIS — Z5111 Encounter for antineoplastic chemotherapy: Secondary | ICD-10-CM | POA: Diagnosis not present

## 2019-11-13 DIAGNOSIS — E46 Unspecified protein-calorie malnutrition: Secondary | ICD-10-CM | POA: Diagnosis not present

## 2019-11-13 DIAGNOSIS — A0472 Enterocolitis due to Clostridium difficile, not specified as recurrent: Secondary | ICD-10-CM | POA: Diagnosis not present

## 2019-11-13 DIAGNOSIS — R197 Diarrhea, unspecified: Secondary | ICD-10-CM

## 2019-11-13 DIAGNOSIS — C787 Secondary malignant neoplasm of liver and intrahepatic bile duct: Secondary | ICD-10-CM | POA: Diagnosis not present

## 2019-11-13 DIAGNOSIS — C569 Malignant neoplasm of unspecified ovary: Secondary | ICD-10-CM

## 2019-11-13 DIAGNOSIS — Z5189 Encounter for other specified aftercare: Secondary | ICD-10-CM | POA: Diagnosis not present

## 2019-11-13 DIAGNOSIS — C786 Secondary malignant neoplasm of retroperitoneum and peritoneum: Secondary | ICD-10-CM | POA: Diagnosis not present

## 2019-11-13 DIAGNOSIS — Z7689 Persons encountering health services in other specified circumstances: Secondary | ICD-10-CM | POA: Diagnosis not present

## 2019-11-13 DIAGNOSIS — E860 Dehydration: Secondary | ICD-10-CM

## 2019-11-13 DIAGNOSIS — I951 Orthostatic hypotension: Secondary | ICD-10-CM | POA: Diagnosis not present

## 2019-11-13 LAB — CBC WITH DIFFERENTIAL/PLATELET
Abs Immature Granulocytes: 0.05 10*3/uL (ref 0.00–0.07)
Basophils Absolute: 0.1 10*3/uL (ref 0.0–0.1)
Basophils Relative: 1 %
Eosinophils Absolute: 0.3 10*3/uL (ref 0.0–0.5)
Eosinophils Relative: 7 %
HCT: 34.4 % — ABNORMAL LOW (ref 36.0–46.0)
Hemoglobin: 11.8 g/dL — ABNORMAL LOW (ref 12.0–15.0)
Immature Granulocytes: 1 %
Lymphocytes Relative: 18 %
Lymphs Abs: 1 10*3/uL (ref 0.7–4.0)
MCH: 30.6 pg (ref 26.0–34.0)
MCHC: 34.3 g/dL (ref 30.0–36.0)
MCV: 89.1 fL (ref 80.0–100.0)
Monocytes Absolute: 0.4 10*3/uL (ref 0.1–1.0)
Monocytes Relative: 7 %
Neutro Abs: 3.5 10*3/uL (ref 1.7–7.7)
Neutrophils Relative %: 66 %
Platelets: 268 10*3/uL (ref 150–400)
RBC: 3.86 MIL/uL — ABNORMAL LOW (ref 3.87–5.11)
RDW: 14.8 % (ref 11.5–15.5)
WBC: 5.2 10*3/uL (ref 4.0–10.5)
nRBC: 0 % (ref 0.0–0.2)

## 2019-11-13 LAB — COMPREHENSIVE METABOLIC PANEL
ALT: 15 U/L (ref 0–44)
AST: 21 U/L (ref 15–41)
Albumin: 3.3 g/dL — ABNORMAL LOW (ref 3.5–5.0)
Alkaline Phosphatase: 82 U/L (ref 38–126)
Anion gap: 10 (ref 5–15)
BUN: 15 mg/dL (ref 8–23)
CO2: 25 mmol/L (ref 22–32)
Calcium: 8.6 mg/dL — ABNORMAL LOW (ref 8.9–10.3)
Chloride: 100 mmol/L (ref 98–111)
Creatinine, Ser: 0.87 mg/dL (ref 0.44–1.00)
GFR calc Af Amer: >60 mL/min (ref >60)
GFR calc non Af Amer: 58 mL/min — ABNORMAL LOW (ref >60)
Glucose, Bld: 150 mg/dL — ABNORMAL HIGH (ref 70–99)
Potassium: 3.4 mmol/L — ABNORMAL LOW (ref 3.5–5.1)
Sodium: 135 mmol/L (ref 135–145)
Total Bilirubin: 0.7 mg/dL (ref 0.3–1.2)
Total Protein: 6.7 g/dL (ref 6.5–8.1)

## 2019-11-13 LAB — MAGNESIUM: Magnesium: 1.7 mg/dL (ref 1.7–2.4)

## 2019-11-13 MED ORDER — HEPARIN SOD (PORK) LOCK FLUSH 100 UNIT/ML IV SOLN
500.0000 [IU] | Freq: Once | INTRAVENOUS | Status: AC
  Administered 2019-11-13: 500 [IU] via INTRAVENOUS
  Filled 2019-11-13: qty 5

## 2019-11-13 MED ORDER — SODIUM CHLORIDE 0.9% FLUSH
10.0000 mL | INTRAVENOUS | Status: DC | PRN
  Administered 2019-11-13: 10 mL via INTRAVENOUS
  Filled 2019-11-13: qty 10

## 2019-11-13 MED ORDER — SODIUM CHLORIDE 0.9 % IV SOLN
Freq: Once | INTRAVENOUS | Status: AC
  Filled 2019-11-13: qty 250

## 2019-11-13 NOTE — Telephone Encounter (Signed)
Received call from daughter, [name]. She would like to come today for IVF. Ms. Mcdaniel is not eating or drinking very much. She is feeling shaky and weaker. Low grade temp of 99.1 last evening. She reports her diarrhea has also returned. Notified Dr. Elroy Channel team and a [facility] visit has been arranged.

## 2019-11-13 NOTE — Progress Notes (Signed)
Pt had a bout of diarrhea last week on Friday. Unable to get specimen as diarrhea quit. Diarrhea returned yest am. 2 episodes of sandy stools. 1 stool this morning. No abdominal pain.Pt has been hesitent to eat and drink. Requesting ginger ale in exam room. Pt alert. VSS. Receiving 1 liter of NS at present. No meds taken( except for thyroid pill) yest and today.

## 2019-11-13 NOTE — Progress Notes (Signed)
[facility]  [phone] [phone]  Patient Care Team: Rusty Aus, MD as PCP - General (Internal Medicine) Clent Jacks, RN as Registered Nurse   Name of the patient: Natalie Stone  [id number]  30-Jan-1927   Date of visit: 11/15/19  Diagnosis- Recurrent Ovarian Cancer  Chief complaint/ Reason for visit- Diarrhea and Weakness  Heme/Onc history:  Oncology History Overview Note  # NOV 2013-RIGHT OVARIAN CANCER-HIGH GRADE SEROUS CA 125-996 STAGE III [[facility]Stephanie [facility]Jan 2014- neo-adj chemo x3 M;April 2014 TAH & BSO ; chemo x 43M [finished C-6; July 2014]. Ca 125 -13; N  # GENETIC TESTING-"OncoGene Dx"-NEG  # May-June 2020- Recurrent adenocarcinoma [ascites s/p para; [facility]] ca-[phone].   # June 8th 2020- Carbo AUC 2  # may 2020- Left hydro-ureteral -n ephrosis [s/p stenting]; diarrhea-? malignancy s/p sigmoidscopy  [[facility]]  CT- showed severe left hydro ureteral nephrosis.  Sigmoid wall thickening differential includes carcinomatosis and colitis.  She did had a repeat scan on 07/09/2018 which showed interval increase in ascites mostly within the pelvis with associated enhancement of the peritoneum suggesting carcinomatosis.  Redemonstration of sigmoid wall thickening which may represent colitis versus carcinomatosis.  Delayed left nephrogram with severe left hydro-ureteronephrosis similar to prior.   Given findings of colitis as well as ongoing symptoms of melena and diarrhea patient underwent EGD as well as flexible sigmoidoscopy which showed Schatzki's ring and diverticulosis but no evidence of tumor.  Her symptoms are possibly secondary to carcinomatosis.  She also had other stool studies done including C. difficile and stool cultures which were negative.Patient did undergo a diagnostic tap of her ascites on 07/09/2018 which was consistent with adenocarcinoma  S/p pacemaker  [Dr.Paraschos] --------------------------------------   DIAGNOSIS: recurrent ovarian cancer  STAGE: IV; GOALS: palliative  CURRENT/MOST RECENT THERAPY: weekly carbo-Taxol     Malignant neoplasm of ovary ([place])  11/23/2015 Initial Diagnosis   Ovarian cancer, unspecified laterality ([place])   07/28/2018 - 12/05/2018 Chemotherapy   The patient had dexamethasone (DECADRON) 4 MG tablet, 8 mg, Oral, Daily, 1 of 1 cycle, Start date: --, End date: -- palonosetron (ALOXI) injection 0.25 mg, 0.25 mg, Intravenous,  Once, 6 of 6 cycles Administration: 0.25 mg (07/28/2018), 0.25 mg (08/04/2018), 0.25 mg (08/11/2018), 0.25 mg (08/18/2018), 0.25 mg (08/25/2018), 0.25 mg (09/01/2018), 0.25 mg (09/15/2018), 0.25 mg (09/29/2018), 0.25 mg (10/13/2018), 0.25 mg (10/30/2018), 0.25 mg (11/14/2018), 0.25 mg (11/21/2018), 0.25 mg (12/05/2018), 0.25 mg (09/08/2018), 0.25 mg (10/06/2018), 0.25 mg (11/06/2018), 0.25 mg (11/28/2018) CARBOplatin (PARAPLATIN) 110 mg in sodium chloride 0.9 % 100 mL chemo infusion, 110 mg (100 % of original dose 113.4 mg), Intravenous,  Once, 6 of 6 cycles Dose modification:   (original dose 113.4 mg, Cycle 1) Administration: 110 mg (07/28/2018), 110 mg (08/04/2018), 110 mg (08/11/2018), 110 mg (08/18/2018), 110 mg (08/25/2018), 90 mg (09/01/2018), 90 mg (09/15/2018), 90 mg (09/29/2018), 90 mg (10/13/2018), 90 mg (10/30/2018), 80 mg (11/14/2018), 80 mg (11/21/2018), 80 mg (12/05/2018), 90 mg (09/08/2018), 90 mg (10/06/2018), 80 mg (11/06/2018), 80 mg (11/28/2018) PACLitaxel (TAXOL) 84 mg in sodium chloride 0.9 % 250 mL chemo infusion (</= 49m/m2), 60 mg/m2 = 84 mg (100 % of original dose 60 mg/m2), Intravenous,  Once, 4 of 4 cycles Dose modification: 60 mg/m2 (original dose 60 mg/m2, Cycle 3, Reason: Other (see comments), Comment: modify rate) Administration: 84 mg (09/08/2018), 84 mg (09/15/2018), 84 mg (09/29/2018), 84 mg (10/06/2018), 84 mg (10/13/2018), 84 mg (10/30/2018), 84 mg (11/14/2018), 84 mg (11/21/2018),  84 mg (12/05/2018), 84  mg (11/06/2018), 84 mg (11/28/2018) PACLitaxel (TAXOL) 84 mg in sodium chloride 0.9 % 250 mL chemo infusion (> 48m/m2), 60 mg/m2 = 84 mg (100 % of original dose 60 mg/m2), Intravenous,  Once, 2 of 2 cycles Dose modification: 60 mg/m2 (original dose 60 mg/m2, Cycle 1, Reason: Other (see comments), Comment: weekly) Administration: 84 mg (08/18/2018), 84 mg (08/25/2018), 84 mg (09/01/2018) fosaprepitant (EMEND) 150 mg, dexamethasone (DECADRON) 12 mg in sodium chloride 0.9 % 145 mL IVPB, , Intravenous,  Once, 4 of 4 cycles Administration:  (07/28/2018),  (08/04/2018),  (08/11/2018),  (08/18/2018),  (08/25/2018),  (09/01/2018),  (09/15/2018),  (09/29/2018),  (10/13/2018),  (09/08/2018),  (10/06/2018)  for chemotherapy treatment.    10/01/2019 -  Chemotherapy   The patient had dexamethasone (DECADRON) 4 MG tablet, 1 of 1 cycle, Start date: 10/01/2019, End date: -- pegfilgrastim (NEULASTA ONPRO KIT) injection 6 mg, 6 mg, Subcutaneous, Once, 2 of 6 cycles Administration: 6 mg (10/01/2019), 6 mg (10/29/2019) CARBOplatin (PARAPLATIN) in sodium chloride 0.9 % 100 mL chemo infusion, , Intravenous,  Once, 0 of 4 cycles DOXOrubicin HCL LIPOSOMAL (DOXIL) 44 mg in dextrose 5 % 250 mL chemo infusion, 30 mg/m2 = 44 mg, Intravenous,  Once, 2 of 6 cycles Administration: 44 mg (10/01/2019), 44 mg (10/29/2019) fosaprepitant (EMEND) 150 mg in sodium chloride 0.9 % 145 mL IVPB, 150 mg, Intravenous,  Once, 2 of 6 cycles Administration: 150 mg (10/01/2019), 150 mg (10/29/2019)  for chemotherapy treatment.      Interval history- [facility] for reports of diarrhea which started yesterday. Reports watery bowel movements x 2 yesterday and once today. No abdominal pain, nausea or vomiting. History of c diff. Not malodorous at this time. Not eating well. Feels weak and shaky. Tired. Concerned she has lost weight. No fevers or chills. No rectal bleeding or recent constipation. Questions if tumor marker has started coming  down. Questions if she had somatic testing performed and results. Accompanied by daughter KJuliann Pulsewho contributes to history.   ECOG FS:2 - Symptomatic, <50% confined to bed  Review of systems- Review of Systems  Constitutional: Positive for malaise/fatigue and weight loss. Negative for chills and fever.  HENT: Negative for hearing loss, nosebleeds, sore throat and tinnitus.   Eyes: Negative for blurred vision and double vision.  Respiratory: Negative for cough, hemoptysis, shortness of breath and wheezing.   Cardiovascular: Negative for chest pain, palpitations and leg swelling.  Gastrointestinal: Positive for diarrhea. Negative for abdominal pain, blood in stool, constipation, melena, nausea and vomiting.  Genitourinary: Negative for dysuria and urgency.  Musculoskeletal: Negative for back pain, falls, joint pain and myalgias.  Skin: Negative for itching and rash.  Neurological: Positive for weakness. Negative for dizziness, tingling, sensory change, loss of consciousness and headaches.  Endo/Heme/Allergies: Negative for environmental allergies. Does not bruise/bleed easily.  Psychiatric/Behavioral: Negative for depression. The patient is not nervous/anxious and does not have insomnia.      Current treatment- doxil, cycle 2 on 10/29/19 with neulasta support  Allergies  Allergen Reactions  . Penicillins Anaphylaxis    Has patient had a PCN reaction causing immediate rash, facial/tongue/throat swelling, SOB or lightheadedness with hypotension: Yes Has patient had a PCN reaction causing severe rash involving mucus membranes or skin necrosis: No Has patient had a PCN reaction that required hospitalization No Has patient had a PCN reaction occurring within the last 10 years: No If all of the above answers are "NO", then may proceed with Cephalosporin  use.    Past Medical History:  Diagnosis Date  . CAD (coronary artery disease)   . Chronic kidney disease    urinary incontinence  .  Hyperthyroidism   . Hypothyroidism (acquired)   . Ovarian cancer ([place]) 2014  . Shingles   . Third degree AV block ([place]) 10/09/2015  . UTI (urinary tract infection)     Past Surgical History:  Procedure Laterality Date  . ABDOMINAL HYSTERECTOMY    . BLADDER REPAIR    . CORONARY ANGIOPLASTY WITH STENT PLACEMENT  2001   Dr. Isaiah Blakes in [place]  . CYSTOURETHROSCOPY, WITH INSERTION OF INDWELLING URETERAL STENT (EG,GIBBONS OR DOUBLE-J TYPE) (Left Ureter Left   . PACEMAKER INSERTION N/A 10/11/2015   Procedure: INSERTION PACEMAKER;  Surgeon: [name], MD;  Location: [facility] ORS;  Service: Cardiovascular;  Laterality: N/A;  . PARTIAL HYSTERECTOMY    . PORTA CATH INSERTION N/A 07/24/2018   Procedure: PORTA CATH INSERTION;  Surgeon: Algernon Huxley, MD;  Location: [facility];  Service: Cardiovascular;  Laterality: N/A;    Social History   Socioeconomic History  . Marital status: Widowed    Spouse name: Not on file  . Number of children: Not on file  . Years of education: Not on file  . Highest education level: Not on file  Occupational History  . Not on file  Tobacco Use  . Smoking status: Never Smoker  . Smokeless tobacco: Never Used  Vaping Use  . Vaping Use: Never used  Substance and Sexual Activity  . Alcohol use: No  . Drug use: No  . Sexual activity: Never  Other Topics Concern  . 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 Difficulty of Paying Living Expenses: Not on file  Food Insecurity:   . Worried About Charity fundraiser in the Last Year: Not on file  . Ran Out of Food in the Last Year: Not on file  Transportation Needs:   . Lack of Transportation (Medical): Not on file  . Lack of Transportation (Non-Medical): Not on file  Physical Activity:   . Days of Exercise per Week: Not on file  . Minutes of Exercise per  Session: Not on file  Stress:   . Feeling of Stress : Not on file  Social Connections:   . Frequency of Communication with Friends and Family: Not on file  . Frequency of Social Gatherings with Friends and Family: Not on file  . Attends Religious Services: Not on file  . Active Member of Clubs or Organizations: Not on file  . Attends Archivist Meetings: Not on file  . Marital Status: Not on file  Intimate Partner Violence:   . Fear of Current or Ex-Partner: Not on file  . Emotionally Abused: Not on file  . Physically Abused: Not on file  . Sexually Abused: Not on file    Family History  Problem Relation Age of Onset  . Diabetes Brother      Current Outpatient Medications:  .  AMERICAN GINSENG PO, Take 2 tablets by mouth daily. 500 mg tab and she takes 2 a day, Disp: , Rfl:  .  aspirin EC 81 MG tablet, Take 81 mg by mouth daily., Disp: , Rfl:  .  atorvastatin (LIPITOR) 10 MG tablet, Take 10 mg by mouth at bedtime. , Disp: ,  Rfl:  .  Bioflavonoid Products (ESTER C PO), Take 500 mg by mouth daily. , Disp: , Rfl:  .  Calcium Carbonate-Vitamin D (CALTRATE 600+D PO), Take 1 tablet by mouth daily. , Disp: , Rfl:  .  Cholecalciferol (VITAMIN D3) 1000 units CAPS, Take 1,000 Units by mouth daily. , Disp: , Rfl:  .  Coenzyme Q10 (COQ10) 100 MG CAPS, Take 100 mg by mouth daily. , Disp: , Rfl:  .  Cyanocobalamin (VITAMIN B-12) 1000 MCG/15ML LIQD, Take 1 mL by mouth daily., Disp: , Rfl:  .  D-MANNOSE PO, Take 1 tablet by mouth daily., Disp: , Rfl:  .  dexamethasone (DECADRON) 4 MG tablet, Take 2 tablets by mouth once a day for 3 days after chemo. Take with food., Disp: 30 tablet, Rfl: 1 .  Digestive Enzymes (DIGESTIVE ENZYME PO), Take 1 Dose by mouth 3 (three) times daily before meals., Disp: , Rfl:  .  Glucosamine-MSM-Hyaluronic Acd (JOINT HEALTH PO), Take 1 tablet by mouth 2 (two) times a day. , Disp: , Rfl:  .  hydrocortisone 2.5 % cream, Apply 1 application topically daily as  needed. , Disp: , Rfl:  .  levothyroxine (SYNTHROID) 100 MCG tablet, Take 100 mcg by mouth daily before breakfast. , Disp: , Rfl:  .  loratadine (CLARITIN) 10 MG tablet, Take 10 mg by mouth as directed. Around the days of neulasta, Disp: , Rfl:  .  mirabegron ER (MYRBETRIQ) 50 MG TB24 tablet, Take 50 mg by mouth daily. , Disp: , Rfl:  .  OLANZapine (ZYPREXA) 10 MG tablet, Take 1 tablet (10 mg total) by mouth at bedtime., Disp: 30 tablet, Rfl: 0 .  ondansetron (ZOFRAN) 4 MG tablet, TAKE 1 TABLET(4 MG) BY MOUTH EVERY 8 HOURS AS NEEDED FOR NAUSEA OR VOMITING (Patient not taking: Reported on 08/07/2019), Disp: 90 tablet, Rfl: 0 .  ondansetron (ZOFRAN) 8 MG tablet, Take 1 tablet (8 mg total) by mouth 2 (two) times daily as needed. Start on the third day after chemotherapy. (Patient not taking: Reported on 10/16/2019), Disp: 30 tablet, Rfl: 1 .  potassium chloride SA (KLOR-CON) 20 MEQ tablet, Take 1 tablet (20 mEq total) by mouth daily., Disp: 3 tablet, Rfl: 0 .  prochlorperazine (COMPAZINE) 10 MG tablet, Take 1 tablet (10 mg total) by mouth every 6 (six) hours as needed (Nausea or vomiting). (Patient not taking: Reported on 10/16/2019), Disp: 30 tablet, Rfl: 1 .  Zinc 25 MG TABS, Take 1 tablet by mouth., Disp: , Rfl:   Physical exam: There were no vitals filed for this visit. Physical Exam Constitutional:      Appearance: She is well-developed.     Comments: Frail. Thin built. Accompanied by daughter  HENT:     Head: Atraumatic.     Nose: Nose normal.     Mouth/Throat:     Pharynx: No oropharyngeal exudate.  Eyes:     General: No scleral icterus.    Conjunctiva/sclera: Conjunctivae normal.  Cardiovascular:     Rate and Rhythm: Regular rhythm. Tachycardia present.  Pulmonary:     Effort: Pulmonary effort is normal.     Breath sounds: Normal breath sounds.  Abdominal:     General: Bowel sounds are normal. There is no distension.     Palpations: Abdomen is soft.     Tenderness: There is no  abdominal tenderness. There is no guarding.  Musculoskeletal:        General: Normal range of motion.     Cervical back: Normal  range of motion and neck supple.     Right lower leg: No edema.     Left lower leg: No edema.  Skin:    General: Skin is warm and dry.  Neurological:     Mental Status: She is alert and oriented to person, place, and time.  Psychiatric:        Mood and Affect: Mood normal.        Behavior: Behavior normal.      CMP Latest Ref Rng & Units 11/13/2019  Glucose 70 - 99 mg/dL 150(H)  BUN 8 - 23 mg/dL 15  Creatinine 0.44 - 1.00 mg/dL 0.87  Sodium 135 - 145 mmol/L 135  Potassium 3.5 - 5.1 mmol/L 3.4(L)  Chloride 98 - 111 mmol/L 100  CO2 22 - 32 mmol/L 25  Calcium 8.9 - 10.3 mg/dL 8.6(L)  Total Protein 6.5 - 8.1 g/dL 6.7  Total Bilirubin 0.3 - 1.2 mg/dL 0.7  Alkaline Phos 38 - 126 U/L 82  AST 15 - 41 U/L 21  ALT 0 - 44 U/L 15   CBC Latest Ref Rng & Units 11/13/2019  WBC 4.0 - 10.5 K/uL 5.2  Hemoglobin 12.0 - 15.0 g/dL 11.8(L)  Hematocrit 36 - 46 % 34.4(L)  Platelets 150 - 400 K/uL 268    No images are attached to the encounter.  No results found.  Assessment and plan- Patient is a 86 y.o. female diagnosed with recurrent ovarian cancer currently on second line doxil who [facility] for diarrhea, poor appetite, and weakness.   1. Diarrhea- etiology unclear. Question chemotherapy induced vs infectious. No stools in clinic. Will send home with stool kit. Recommend holding off on anti-diarrheals in setting of possible infectious etiology. Can consider if negative however. Recommend imodium with lomotil if refractory.   2. Malnutrition- no improvement with remeron. Discussed megace but opted to have her follow up with palliative care.    3. Hypokalemia- K 3.4. Slightly lower than baseline. Likely secondary to poor oral intake and GI losses. Start potassium 20 meq daily. Will recheck bmp in 3 days.   4. Orthostatic hypotension-  likely secondary to poor oral intake. IV fluids in clinic with improvement in symptoms. May need ongoing fluid support.  Follow up in clinic on 9/27 with labs (cbc, bmp, mg), SMC, possible fluids.    Visit Diagnosis 1. Diarrhea, unspecified type   2. Protein-calorie malnutrition, unspecified severity ([place])   3. Orthostatic hypotension     Patient expressed understanding and was in agreement with this plan. She also understands that She can call clinic at any time with any questions, concerns, or complaints.   Thank you for allowing me to participate in the care of this very pleasant patient.   Beckey Rutter, DNP, AGNP-[facility]  CC: Billey Chang, NP

## 2019-11-13 NOTE — Telephone Encounter (Signed)
Daughter called to say that her mother has not been able to drink and eat enough, she started back with diarrhea and thinks she needs fluids. Spoke to [facility] and [facility] and pt can come in 10 am for labs and then see [facility] clinic and get fluids. [name] will have her here by 10

## 2019-11-14 ENCOUNTER — Other Ambulatory Visit: Payer: Self-pay | Admitting: Oncology

## 2019-11-14 ENCOUNTER — Telehealth: Payer: Self-pay | Admitting: Oncology

## 2019-11-14 DIAGNOSIS — R197 Diarrhea, unspecified: Secondary | ICD-10-CM

## 2019-11-14 MED ORDER — POTASSIUM CHLORIDE CRYS ER 20 MEQ PO TBCR: 20.0000 meq | EXTENDED_RELEASE_TABLET | Freq: Every day | ORAL | 0 refills | Status: DC

## 2019-11-14 NOTE — Telephone Encounter (Signed)
Daughter [name] called to ask a potassium prescription that she was told to pick up at [facility].  K is 3.4. no potassium was ordered on 11/13/2019. I ordered a Rx of potassium chloride 26meq daily x 4 days. [name] will pick up for patient.  Recommend patient to submit a stool sample to Hospital main lab and if she is tested positive for C diff, I will send her antibiotics. Advise good oral hydration. And if he symptoms are getting worse, advise her to go to ER.

## 2019-11-14 NOTE — Telephone Encounter (Signed)
Patient's daughter called to report that patient was seen yesterday by NP for diarrhea and got IVF.  Stool was not submitted. Today patient had a large loose bowel movement and patient's daughter reports the stool smells like when she had c diff before.  Advise patient to keep well hydrated with Pedialyte and water.recommend her to go to urgent care to have blood work and stool checked. Daughter appreciates the advise.

## 2019-11-15 NOTE — Telephone Encounter (Signed)
Thanks zhou. I will f/u

## 2019-11-16 ENCOUNTER — Inpatient Hospital Stay (HOSPITAL_BASED_OUTPATIENT_CLINIC_OR_DEPARTMENT_OTHER): Payer: Medicare HMO | Admitting: Nurse Practitioner

## 2019-11-16 ENCOUNTER — Other Ambulatory Visit: Payer: Self-pay

## 2019-11-16 ENCOUNTER — Other Ambulatory Visit: Payer: Self-pay | Admitting: *Deleted

## 2019-11-16 ENCOUNTER — Inpatient Hospital Stay: Payer: Medicare HMO

## 2019-11-16 VITALS — BP 107/55 | HR 108 | Temp 99.0°F | Resp 18

## 2019-11-16 DIAGNOSIS — C787 Secondary malignant neoplasm of liver and intrahepatic bile duct: Secondary | ICD-10-CM | POA: Diagnosis not present

## 2019-11-16 DIAGNOSIS — I951 Orthostatic hypotension: Secondary | ICD-10-CM

## 2019-11-16 DIAGNOSIS — E860 Dehydration: Secondary | ICD-10-CM

## 2019-11-16 DIAGNOSIS — A0472 Enterocolitis due to Clostridium difficile, not specified as recurrent: Secondary | ICD-10-CM | POA: Diagnosis not present

## 2019-11-16 DIAGNOSIS — C786 Secondary malignant neoplasm of retroperitoneum and peritoneum: Secondary | ICD-10-CM | POA: Diagnosis not present

## 2019-11-16 DIAGNOSIS — Z7689 Persons encountering health services in other specified circumstances: Secondary | ICD-10-CM | POA: Diagnosis not present

## 2019-11-16 DIAGNOSIS — C569 Malignant neoplasm of unspecified ovary: Secondary | ICD-10-CM

## 2019-11-16 DIAGNOSIS — R197 Diarrhea, unspecified: Secondary | ICD-10-CM

## 2019-11-16 DIAGNOSIS — Z5189 Encounter for other specified aftercare: Secondary | ICD-10-CM | POA: Diagnosis not present

## 2019-11-16 DIAGNOSIS — Z5111 Encounter for antineoplastic chemotherapy: Secondary | ICD-10-CM | POA: Diagnosis not present

## 2019-11-16 DIAGNOSIS — E876 Hypokalemia: Secondary | ICD-10-CM

## 2019-11-16 LAB — CBC WITH DIFFERENTIAL/PLATELET
Abs Immature Granulocytes: 0.05 10*3/uL (ref 0.00–0.07)
Basophils Absolute: 0 10*3/uL (ref 0.0–0.1)
Basophils Relative: 1 %
Eosinophils Absolute: 0.2 10*3/uL (ref 0.0–0.5)
Eosinophils Relative: 3 %
HCT: 33.1 % — ABNORMAL LOW (ref 36.0–46.0)
Hemoglobin: 11.1 g/dL — ABNORMAL LOW (ref 12.0–15.0)
Immature Granulocytes: 1 %
Lymphocytes Relative: 16 %
Lymphs Abs: 1 10*3/uL (ref 0.7–4.0)
MCH: 29.8 pg (ref 26.0–34.0)
MCHC: 33.5 g/dL (ref 30.0–36.0)
MCV: 88.7 fL (ref 80.0–100.0)
Monocytes Absolute: 0.8 10*3/uL (ref 0.1–1.0)
Monocytes Relative: 12 %
Neutro Abs: 4.2 10*3/uL (ref 1.7–7.7)
Neutrophils Relative %: 67 %
Platelets: 379 10*3/uL (ref 150–400)
RBC: 3.73 MIL/uL — ABNORMAL LOW (ref 3.87–5.11)
RDW: 15.1 % (ref 11.5–15.5)
WBC: 6.2 10*3/uL (ref 4.0–10.5)
nRBC: 0 % (ref 0.0–0.2)

## 2019-11-16 LAB — COMPREHENSIVE METABOLIC PANEL
ALT: 14 U/L (ref 0–44)
AST: 19 U/L (ref 15–41)
Albumin: 3.1 g/dL — ABNORMAL LOW (ref 3.5–5.0)
Alkaline Phosphatase: 70 U/L (ref 38–126)
Anion gap: 9 (ref 5–15)
BUN: 13 mg/dL (ref 8–23)
CO2: 25 mmol/L (ref 22–32)
Calcium: 8.4 mg/dL — ABNORMAL LOW (ref 8.9–10.3)
Chloride: 101 mmol/L (ref 98–111)
Creatinine, Ser: 0.83 mg/dL (ref 0.44–1.00)
GFR calc Af Amer: >60 mL/min (ref >60)
GFR calc non Af Amer: >60 mL/min (ref >60)
Glucose, Bld: 127 mg/dL — ABNORMAL HIGH (ref 70–99)
Potassium: 3.3 mmol/L — ABNORMAL LOW (ref 3.5–5.1)
Sodium: 135 mmol/L (ref 135–145)
Total Bilirubin: 0.6 mg/dL (ref 0.3–1.2)
Total Protein: 6.3 g/dL — ABNORMAL LOW (ref 6.5–8.1)

## 2019-11-16 LAB — C DIFFICILE QUICK SCREEN W PCR REFLEX
C Diff antigen: POSITIVE — AB
C Diff toxin: NEGATIVE

## 2019-11-16 LAB — MAGNESIUM: Magnesium: 1.5 mg/dL — ABNORMAL LOW (ref 1.7–2.4)

## 2019-11-16 MED ORDER — SODIUM CHLORIDE 0.9% FLUSH
10.0000 mL | Freq: Once | INTRAVENOUS | Status: AC
  Administered 2019-11-16: 10 mL via INTRAVENOUS
  Filled 2019-11-16: qty 10

## 2019-11-16 MED ORDER — SODIUM CHLORIDE 0.9 % IV SOLN
Freq: Once | INTRAVENOUS | Status: AC
  Filled 2019-11-16: qty 1000

## 2019-11-16 MED ORDER — HEPARIN SOD (PORK) LOCK FLUSH 100 UNIT/ML IV SOLN
500.0000 [IU] | Freq: Once | INTRAVENOUS | Status: AC
  Administered 2019-11-16: 500 [IU] via INTRAVENOUS
  Filled 2019-11-16: qty 5

## 2019-11-16 NOTE — Progress Notes (Signed)
[name]  [phone]) [phone] [phone]) [phone]  Patient Care Team: Rusty Aus, MD as PCP - General (Internal Medicine) Clent Jacks, RN as Registered Nurse   Name of the patient: Natalie Stone  [id number]  11-Sep-1926   Date of visit: 11/16/19  Diagnosis- Recurrent Ovarian Cancer  Chief complaint/ Reason for visit- Diarrhea and Weakness  Heme/Onc history:  Oncology History Overview Note  # NOV 2013-RIGHT OVARIAN CANCER-HIGH GRADE SEROUS CA 125-996 STAGE III [Dr[facility]Stephanie [facility]Jan 2014- neo-adj chemo x3 M;April 2014 TAH & BSO ; chemo x 41M [finished C-6; July 2014]. Ca 125 -13; N  # GENETIC TESTING-"OncoGene Dx"-NEG  # May-June 2020- Recurrent adenocarcinoma [ascites s/p para; [facility]] [phone].   # June 8th 2020- Carbo AUC 2  # may 2020- Left hydro-ureteral -n ephrosis [s/p stenting]; diarrhea-? malignancy s/p sigmoidscopy  [[facility]]  CT- showed severe left hydro ureteral nephrosis.  Sigmoid wall thickening differential includes carcinomatosis and colitis.  She did had a repeat scan on 07/09/2018 which showed interval increase in ascites mostly within the pelvis with associated enhancement of the peritoneum suggesting carcinomatosis.  Redemonstration of sigmoid wall thickening which may represent colitis versus carcinomatosis.  Delayed left nephrogram with severe left hydro-ureteronephrosis similar to prior.   Given findings of colitis as well as ongoing symptoms of melena and diarrhea patient underwent EGD as well as flexible sigmoidoscopy which showed Schatzki's ring and diverticulosis but no evidence of tumor.  Her symptoms are possibly secondary to carcinomatosis.  She also had other stool studies done including C. difficile and stool cultures which were negative.Patient did undergo a diagnostic tap of her ascites on 07/09/2018 which was consistent with adenocarcinoma  S/p pacemaker  [Dr.Paraschos] --------------------------------------   DIAGNOSIS: recurrent ovarian cancer  STAGE: IV; GOALS: palliative  CURRENT/MOST RECENT THERAPY: weekly carbo-Taxol     Malignant neoplasm of ovary ([place])  11/23/2015 Initial Diagnosis   Ovarian cancer, unspecified laterality ([place])   07/28/2018 - 12/05/2018 Chemotherapy   The patient had dexamethasone (DECADRON) 4 MG tablet, 8 mg, Oral, Daily, 1 of 1 cycle, Start date: --, End date: -- palonosetron (ALOXI) injection 0.25 mg, 0.25 mg, Intravenous,  Once, 6 of 6 cycles Administration: 0.25 mg (07/28/2018), 0.25 mg (08/04/2018), 0.25 mg (08/11/2018), 0.25 mg (08/18/2018), 0.25 mg (08/25/2018), 0.25 mg (09/01/2018), 0.25 mg (09/15/2018), 0.25 mg (09/29/2018), 0.25 mg (10/13/2018), 0.25 mg (10/30/2018), 0.25 mg (11/14/2018), 0.25 mg (11/21/2018), 0.25 mg (12/05/2018), 0.25 mg (09/08/2018), 0.25 mg (10/06/2018), 0.25 mg (11/06/2018), 0.25 mg (11/28/2018) CARBOplatin (PARAPLATIN) 110 mg in sodium chloride 0.9 % 100 mL chemo infusion, 110 mg (100 % of original dose 113.4 mg), Intravenous,  Once, 6 of 6 cycles Dose modification:   (original dose 113.4 mg, Cycle 1) Administration: 110 mg (07/28/2018), 110 mg (08/04/2018), 110 mg (08/11/2018), 110 mg (08/18/2018), 110 mg (08/25/2018), 90 mg (09/01/2018), 90 mg (09/15/2018), 90 mg (09/29/2018), 90 mg (10/13/2018), 90 mg (10/30/2018), 80 mg (11/14/2018), 80 mg (11/21/2018), 80 mg (12/05/2018), 90 mg (09/08/2018), 90 mg (10/06/2018), 80 mg (11/06/2018), 80 mg (11/28/2018) PACLitaxel (TAXOL) 84 mg in sodium chloride 0.9 % 250 mL chemo infusion (</= 47m/m2), 60 mg/m2 = 84 mg (100 % of original dose 60 mg/m2), Intravenous,  Once, 4 of 4 cycles Dose modification: 60 mg/m2 (original dose 60 mg/m2, Cycle 3, Reason: Other (see comments), Comment: modify rate) Administration: 84 mg (09/08/2018), 84 mg (09/15/2018), 84 mg (09/29/2018), 84 mg (10/06/2018), 84 mg (10/13/2018), 84 mg (10/30/2018), 84 mg (11/14/2018), 84 mg (11/21/2018),  84 mg (12/05/2018), 84  mg (11/06/2018), 84 mg (11/28/2018) PACLitaxel (TAXOL) 84 mg in sodium chloride 0.9 % 250 mL chemo infusion (> 92m/m2), 60 mg/m2 = 84 mg (100 % of original dose 60 mg/m2), Intravenous,  Once, 2 of 2 cycles Dose modification: 60 mg/m2 (original dose 60 mg/m2, Cycle 1, Reason: Other (see comments), Comment: weekly) Administration: 84 mg (08/18/2018), 84 mg (08/25/2018), 84 mg (09/01/2018) fosaprepitant (EMEND) 150 mg, dexamethasone (DECADRON) 12 mg in sodium chloride 0.9 % 145 mL IVPB, , Intravenous,  Once, 4 of 4 cycles Administration:  (07/28/2018),  (08/04/2018),  (08/11/2018),  (08/18/2018),  (08/25/2018),  (09/01/2018),  (09/15/2018),  (09/29/2018),  (10/13/2018),  (09/08/2018),  (10/06/2018)  for chemotherapy treatment.    10/01/2019 -  Chemotherapy   The patient had dexamethasone (DECADRON) 4 MG tablet, 1 of 1 cycle, Start date: 10/01/2019, End date: -- pegfilgrastim (NEULASTA ONPRO KIT) injection 6 mg, 6 mg, Subcutaneous, Once, 2 of 6 cycles Administration: 6 mg (10/01/2019), 6 mg (10/29/2019) CARBOplatin (PARAPLATIN) in sodium chloride 0.9 % 100 mL chemo infusion, , Intravenous,  Once, 0 of 4 cycles DOXOrubicin HCL LIPOSOMAL (DOXIL) 44 mg in dextrose 5 % 250 mL chemo infusion, 30 mg/m2 = 44 mg, Intravenous,  Once, 2 of 6 cycles Administration: 44 mg (10/01/2019), 44 mg (10/29/2019) fosaprepitant (EMEND) 150 mg in sodium chloride 0.9 % 145 mL IVPB, 150 mg, Intravenous,  Once, 2 of 6 cycles Administration: 150 mg (10/01/2019), 150 mg (10/29/2019)  for chemotherapy treatment.      Interval history- Patient returns to symptom management clinic for reevaluation of diarrhea which started last week.  Seen in symptom management clinic on Friday.  Symptoms were mild at that time.  Today, she says that symptoms worsened over the weekend and she had near constant diarrhea.  Described as watery. She has provided a stool sample today. Per patient it smells like when she had c diff in the past.   ECOG FS:2 - Symptomatic, <50%  confined to bed  Review of systems- Review of Systems  Constitutional: Positive for malaise/fatigue and weight loss. Negative for chills and fever.  HENT: Negative for hearing loss, nosebleeds, sore throat and tinnitus.   Eyes: Negative for blurred vision and double vision.  Respiratory: Negative for cough, hemoptysis, shortness of breath and wheezing.   Cardiovascular: Negative for chest pain, palpitations and leg swelling.  Gastrointestinal: Positive for diarrhea. Negative for abdominal pain, blood in stool, constipation, melena, nausea and vomiting.  Genitourinary: Negative for dysuria and urgency.  Musculoskeletal: Negative for back pain, falls, joint pain and myalgias.  Skin: Negative for itching and rash.  Neurological: Positive for weakness. Negative for dizziness, tingling, sensory change, loss of consciousness and headaches.  Endo/Heme/Allergies: Negative for environmental allergies. Does not bruise/bleed easily.  Psychiatric/Behavioral: Negative for depression. The patient is not nervous/anxious and does not have insomnia.      Current treatment- doxil, cycle 2 on 10/29/19 with neulasta support  Allergies  Allergen Reactions   Penicillins Anaphylaxis    Has patient had a PCN reaction causing immediate rash, facial/tongue/throat swelling, SOB or lightheadedness with hypotension: Yes Has patient had a PCN reaction causing severe rash involving mucus membranes or skin necrosis: No Has patient had a PCN reaction that required hospitalization No Has patient had a PCN reaction occurring within the last 10 years: No If all of the above answers are "NO", then may proceed with Cephalosporin use.    Past Medical History:  Diagnosis Date   CAD (coronary artery disease)  Chronic kidney disease    urinary incontinence   Hyperthyroidism    Hypothyroidism (acquired)    Ovarian cancer ([place]) 2014   Shingles    Third degree AV block ([place]) 10/09/2015   UTI (urinary tract  infection)     Past Surgical History:  Procedure Laterality Date   ABDOMINAL HYSTERECTOMY     BLADDER REPAIR     CORONARY ANGIOPLASTY WITH STENT PLACEMENT  2001   Dr. Isaiah Blakes in [place]   CYSTOURETHROSCOPY, WITH INSERTION OF INDWELLING URETERAL STENT (EG,GIBBONS OR DOUBLE-J TYPE) (Left Ureter Left    PACEMAKER INSERTION N/A 10/11/2015   Procedure: INSERTION PACEMAKER;  Surgeon: [name], MD;  Location: [facility] ORS;  Service: Cardiovascular;  Laterality: N/A;   PARTIAL HYSTERECTOMY     PORTA CATH INSERTION N/A 07/24/2018   Procedure: PORTA CATH INSERTION;  Surgeon: Algernon Huxley, MD;  Location: [facility];  Service: Cardiovascular;  Laterality: N/A;    Social History   Socioeconomic History   Marital status: Widowed    Spouse name: Not on file   Number of children: Not on file   Years of education: Not on file   Highest education level: Not on file  Occupational History   Not on file  Tobacco Use   Smoking status: Never Smoker   Smokeless tobacco: Never Used  Vaping Use   Vaping Use: Never used  Substance and Sexual Activity   Alcohol use: No   Drug use: No   Sexual activity: Never  Other Topics Concern   Not on file  Social History Narrative   moved from [place] in July 2017; 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; school Pharmacist, hospital;    Social Determinants of Health   Financial Resource Strain:    Difficulty of Paying Living Expenses: Not on file  Food Insecurity:    Worried About Charity fundraiser in the Last Year: Not on file   [employer] of Food in the Last Year: Not on file  Transportation Needs:    Lack of Transportation (Medical): Not on file   Lack of Transportation (Non-Medical): Not on file  Physical Activity:    Days of Exercise per Week: Not on file   Minutes of Exercise per Session: Not on file  Stress:    Feeling of Stress : Not on file  Social Connections:    Frequency of Communication with Friends and Family: Not on file    Frequency of Social Gatherings with Friends and Family: Not on file   Attends Religious Services: Not on file   Active Member of Clubs or Organizations: Not on file   Attends Archivist Meetings: Not on file   Marital Status: Not on file  Intimate Partner Violence:    Fear of Current or Ex-Partner: Not on file   Emotionally Abused: Not on file   Physically Abused: Not on file   Sexually Abused: Not on file    Family History  Problem Relation Age of Onset   Diabetes Brother      Current Outpatient Medications:    AMERICAN GINSENG PO, Take 2 tablets by mouth daily. 500 mg tab and she takes 2 a day, Disp: , Rfl:    aspirin EC 81 MG tablet, Take 81 mg by mouth daily., Disp: , Rfl:    atorvastatin (LIPITOR) 10 MG tablet, Take 10 mg by mouth at bedtime. , Disp: , Rfl:    Bioflavonoid Products (ESTER C PO), Take 500 mg by mouth daily. , Disp: , Rfl:  Calcium Carbonate-Vitamin D (CALTRATE 600+D PO), Take 1 tablet by mouth daily. , Disp: , Rfl:    Cholecalciferol (VITAMIN D3) 1000 units CAPS, Take 1,000 Units by mouth daily. , Disp: , Rfl:    Coenzyme Q10 (COQ10) 100 MG CAPS, Take 100 mg by mouth daily. , Disp: , Rfl:    Cyanocobalamin (VITAMIN B-12) 1000 MCG/15ML LIQD, Take 1 mL by mouth daily., Disp: , Rfl:    D-MANNOSE PO, Take 1 tablet by mouth daily., Disp: , Rfl:    dexamethasone (DECADRON) 4 MG tablet, Take 2 tablets by mouth once a day for 3 days after chemo. Take with food., Disp: 30 tablet, Rfl: 1   Digestive Enzymes (DIGESTIVE ENZYME PO), Take 1 Dose by mouth 3 (three) times daily before meals., Disp: , Rfl:    Glucosamine-MSM-Hyaluronic Acd (JOINT HEALTH PO), Take 1 tablet by mouth 2 (two) times a day. , Disp: , Rfl:    hydrocortisone 2.5 % cream, Apply 1 application topically daily as needed. , Disp: , Rfl:    levothyroxine (SYNTHROID) 100 MCG tablet, Take 100 mcg by mouth daily before breakfast. , Disp: , Rfl:    loratadine (CLARITIN) 10 MG  tablet, Take 10 mg by mouth as directed. Around the days of neulasta, Disp: , Rfl:    mirabegron ER (MYRBETRIQ) 50 MG TB24 tablet, Take 50 mg by mouth daily. , Disp: , Rfl:    OLANZapine (ZYPREXA) 10 MG tablet, Take 1 tablet (10 mg total) by mouth at bedtime., Disp: 30 tablet, Rfl: 0   ondansetron (ZOFRAN) 4 MG tablet, TAKE 1 TABLET(4 MG) BY MOUTH EVERY 8 HOURS AS NEEDED FOR NAUSEA OR VOMITING (Patient not taking: Reported on 08/07/2019), Disp: 90 tablet, Rfl: 0   ondansetron (ZOFRAN) 8 MG tablet, Take 1 tablet (8 mg total) by mouth 2 (two) times daily as needed. Start on the third day after chemotherapy. (Patient not taking: Reported on 10/16/2019), Disp: 30 tablet, Rfl: 1   potassium chloride SA (KLOR-CON) 20 MEQ tablet, Take 1 tablet (20 mEq total) by mouth daily., Disp: 3 tablet, Rfl: 0   prochlorperazine (COMPAZINE) 10 MG tablet, Take 1 tablet (10 mg total) by mouth every 6 (six) hours as needed (Nausea or vomiting). (Patient not taking: Reported on 10/16/2019), Disp: 30 tablet, Rfl: 1   Zinc 25 MG TABS, Take 1 tablet by mouth., Disp: , Rfl:   Physical exam: There were no vitals filed for this visit. Physical Exam Constitutional:      Appearance: She is well-developed.     Comments: Frail. Thin built. Accompanied by daughter  HENT:     Head: Atraumatic.     Nose: Nose normal.     Mouth/Throat:     Pharynx: No oropharyngeal exudate.  Eyes:     General: No scleral icterus.    Conjunctiva/sclera: Conjunctivae normal.  Cardiovascular:     Rate and Rhythm: Regular rhythm. Tachycardia present.  Pulmonary:     Effort: Pulmonary effort is normal.     Breath sounds: Normal breath sounds.  Abdominal:     General: Bowel sounds are normal. There is no distension.     Palpations: Abdomen is soft.     Tenderness: There is no abdominal tenderness. There is no guarding.  Musculoskeletal:        General: Normal range of motion.     Cervical back: Normal range of motion and neck supple.      Right lower leg: No edema.     Left lower leg:  No edema.  Skin:    General: Skin is warm and dry.  Neurological:     Mental Status: She is alert and oriented to person, place, and time.  Psychiatric:        Mood and Affect: Mood normal.        Behavior: Behavior normal.      CMP Latest Ref Rng & Units 11/16/2019  Glucose 70 - 99 mg/dL 127(H)  BUN 8 - 23 mg/dL 13  Creatinine 0.44 - 1.00 mg/dL 0.83  Sodium 135 - 145 mmol/L 135  Potassium 3.5 - 5.1 mmol/L 3.3(L)  Chloride 98 - 111 mmol/L 101  CO2 22 - 32 mmol/L 25  Calcium 8.9 - 10.3 mg/dL 8.4(L)  Total Protein 6.5 - 8.1 g/dL 6.3(L)  Total Bilirubin 0.3 - 1.2 mg/dL 0.6  Alkaline Phos 38 - 126 U/L 70  AST 15 - 41 U/L 19  ALT 0 - 44 U/L 14   CBC Latest Ref Rng & Units 11/16/2019  WBC 4.0 - 10.5 K/uL 6.2  Hemoglobin 12.0 - 15.0 g/dL 11.1(L)  Hematocrit 36 - 46 % 33.1(L)  Platelets 150 - 400 K/uL 379    No images are attached to the encounter.  No results found.  Assessment and plan- Patient is a 86 y.o. female diagnosed with recurrent ovarian cancer currently on second line doxil who presents to [facility]om Management Clinic for diarrhea, poor appetite, and weakness.   1. C diff- c diff antigen positive. PCR positive. Patient is symptomatic. Dr. Janese Banks will send fidaxomicin 200 mg BID x 10 days.   2. Hypokalemia- K 3.3. Likely secondary to GI losses and poor oral intake. Will give KCl 10 meq in clinic today. Continue oral potassium 20 meq daily. Follow up for blood work with Dr. Janese Banks as scheduled on 10/8.   3. Orthostatic hypotension- likely secondary to poor oral intake. IV fluids in clinic with improvement in symptoms.   4. Malnutrition- no improvement with remeron. On zyprexa for nausea. Follow up with palliative care next week for discussion of appetite stimulants and ongoing palliative care.   5. Hypomagnesemia- Mag slightly low today. Will give magnesium 2 g in clinic today.   Return to Symptom Management Clinic if symptoms  don't improve or worsen. F/u w/ palliative care on 10/4. Follow up with Dr. Janese Banks on 10/8.    Visit Diagnosis 1. C. difficile diarrhea   2. Hypokalemia due to excessive gastrointestinal loss of potassium   3. Hypomagnesemia   4. Orthostatic hypotension     Patient expressed understanding and was in agreement with this plan. She also understands that She can call clinic at any time with any questions, concerns, or complaints.   Thank you for allowing me to participate in the care of this very pleasant patient.   Beckey Rutter, DNP, AGNP-[facility]  CC: Billey Chang, NP

## 2019-11-16 NOTE — Progress Notes (Signed)
[facility] follow up visit from Fri. Pt was able to bring small stool sample in today. Patient feels some better. Tolerating oral soups, boost, crackers, toast. Her normal intake basically since initiating chemotherapy according to daughter.

## 2019-11-17 ENCOUNTER — Telehealth: Payer: Self-pay | Admitting: *Deleted

## 2019-11-17 ENCOUNTER — Other Ambulatory Visit: Payer: Self-pay | Admitting: Oncology

## 2019-11-17 ENCOUNTER — Other Ambulatory Visit: Payer: Self-pay | Admitting: *Deleted

## 2019-11-17 LAB — CLOSTRIDIUM DIFFICILE BY PCR, REFLEXED: Toxigenic C. Difficile by PCR: POSITIVE — AB

## 2019-11-17 MED ORDER — FIDAXOMICIN 200 MG PO TABS: 200.0000 mg | ORAL_TABLET | Freq: Two times a day (BID) | ORAL | 0 refills | Status: DC

## 2019-11-17 MED ORDER — POTASSIUM CHLORIDE CRYS ER 20 MEQ PO TBCR
20.0000 meq | EXTENDED_RELEASE_TABLET | Freq: Every day | ORAL | 0 refills | Status: AC
Start: 2019-11-17 — End: ?

## 2019-11-17 MED ORDER — VANCOMYCIN HCL 125 MG PO CAPS: 125.0000 mg | ORAL_CAPSULE | Freq: Four times a day (QID) | ORAL | 0 refills | Status: AC

## 2019-11-17 NOTE — Progress Notes (Signed)
fida

## 2019-11-17 NOTE — Telephone Encounter (Signed)
Called [employer] and let her know that prior auth approved and I called the pharmacy and they will get the med ready and let her know. They will start pill today

## 2019-11-17 NOTE — Telephone Encounter (Signed)
Pt was tested postive for c diff and dr Janese Banks wants her to start the drug today. It has been sent to her pharmacy and if it is out of stock or too costly to please call us to see what we can do. Gave her my cell phone if any issues

## 2019-11-17 NOTE — Telephone Encounter (Signed)
Called and left message that I was working on getting dificid med for pt and no pharmacy has it and it is very expensive. Dr. Janese Banks then said to try vancomycin- the pharmacy has it in stock but needs prior auth. Did the prior auth but the company has 24 hours to respond. I have called back to daughter and got her on the phone and knows what is going on and that we have switched to vancomycin but waiting on approval.  She will wait for approval and her pharmacy send them a text when drug ready. Pt feels good today after getting fluids and she has not had any more diarrhea since yest.

## 2019-11-23 ENCOUNTER — Encounter: Payer: Self-pay | Admitting: Oncology

## 2019-11-23 ENCOUNTER — Other Ambulatory Visit: Payer: Self-pay

## 2019-11-23 ENCOUNTER — Other Ambulatory Visit: Payer: Self-pay | Admitting: *Deleted

## 2019-11-23 ENCOUNTER — Inpatient Hospital Stay: Payer: Medicare HMO

## 2019-11-23 ENCOUNTER — Inpatient Hospital Stay (HOSPITAL_BASED_OUTPATIENT_CLINIC_OR_DEPARTMENT_OTHER): Payer: Medicare HMO | Admitting: Oncology

## 2019-11-23 ENCOUNTER — Inpatient Hospital Stay: Payer: Medicare HMO | Attending: Hospice and Palliative Medicine | Admitting: Hospice and Palliative Medicine

## 2019-11-23 VITALS — BP 142/76 | HR 107 | Temp 98.8°F | Resp 17

## 2019-11-23 VITALS — BP 100/82 | HR 123 | Temp 98.4°F | Resp 16 | Ht 61.0 in | Wt 104.7 lb

## 2019-11-23 DIAGNOSIS — E039 Hypothyroidism, unspecified: Secondary | ICD-10-CM | POA: Insufficient documentation

## 2019-11-23 DIAGNOSIS — C786 Secondary malignant neoplasm of retroperitoneum and peritoneum: Secondary | ICD-10-CM | POA: Diagnosis not present

## 2019-11-23 DIAGNOSIS — Z9221 Personal history of antineoplastic chemotherapy: Secondary | ICD-10-CM | POA: Insufficient documentation

## 2019-11-23 DIAGNOSIS — I2510 Atherosclerotic heart disease of native coronary artery without angina pectoris: Secondary | ICD-10-CM | POA: Insufficient documentation

## 2019-11-23 DIAGNOSIS — Z515 Encounter for palliative care: Secondary | ICD-10-CM | POA: Diagnosis not present

## 2019-11-23 DIAGNOSIS — N189 Chronic kidney disease, unspecified: Secondary | ICD-10-CM | POA: Diagnosis not present

## 2019-11-23 DIAGNOSIS — C563 Malignant neoplasm of bilateral ovaries: Secondary | ICD-10-CM

## 2019-11-23 DIAGNOSIS — Z90710 Acquired absence of both cervix and uterus: Secondary | ICD-10-CM | POA: Diagnosis not present

## 2019-11-23 DIAGNOSIS — R531 Weakness: Secondary | ICD-10-CM | POA: Diagnosis not present

## 2019-11-23 DIAGNOSIS — Z950 Presence of cardiac pacemaker: Secondary | ICD-10-CM | POA: Insufficient documentation

## 2019-11-23 DIAGNOSIS — Z7982 Long term (current) use of aspirin: Secondary | ICD-10-CM | POA: Insufficient documentation

## 2019-11-23 DIAGNOSIS — C569 Malignant neoplasm of unspecified ovary: Secondary | ICD-10-CM | POA: Insufficient documentation

## 2019-11-23 DIAGNOSIS — I442 Atrioventricular block, complete: Secondary | ICD-10-CM | POA: Insufficient documentation

## 2019-11-23 DIAGNOSIS — E860 Dehydration: Secondary | ICD-10-CM

## 2019-11-23 DIAGNOSIS — A0472 Enterocolitis due to Clostridium difficile, not specified as recurrent: Secondary | ICD-10-CM | POA: Diagnosis not present

## 2019-11-23 DIAGNOSIS — Z87440 Personal history of urinary (tract) infections: Secondary | ICD-10-CM | POA: Insufficient documentation

## 2019-11-23 DIAGNOSIS — C787 Secondary malignant neoplasm of liver and intrahepatic bile duct: Secondary | ICD-10-CM | POA: Diagnosis not present

## 2019-11-23 DIAGNOSIS — Z66 Do not resuscitate: Secondary | ICD-10-CM | POA: Diagnosis not present

## 2019-11-23 DIAGNOSIS — Z95828 Presence of other vascular implants and grafts: Secondary | ICD-10-CM

## 2019-11-23 DIAGNOSIS — Z79899 Other long term (current) drug therapy: Secondary | ICD-10-CM | POA: Insufficient documentation

## 2019-11-23 LAB — CBC WITH DIFFERENTIAL/PLATELET
Abs Immature Granulocytes: 0.09 10*3/uL — ABNORMAL HIGH (ref 0.00–0.07)
Basophils Absolute: 0.1 10*3/uL (ref 0.0–0.1)
Basophils Relative: 1 %
Eosinophils Absolute: 0.2 10*3/uL (ref 0.0–0.5)
Eosinophils Relative: 2 %
HCT: 34.6 % — ABNORMAL LOW (ref 36.0–46.0)
Hemoglobin: 11.4 g/dL — ABNORMAL LOW (ref 12.0–15.0)
Immature Granulocytes: 1 %
Lymphocytes Relative: 12 %
Lymphs Abs: 1.4 10*3/uL (ref 0.7–4.0)
MCH: 29.8 pg (ref 26.0–34.0)
MCHC: 32.9 g/dL (ref 30.0–36.0)
MCV: 90.6 fL (ref 80.0–100.0)
Monocytes Absolute: 1.3 10*3/uL — ABNORMAL HIGH (ref 0.1–1.0)
Monocytes Relative: 12 %
Neutro Abs: 8.4 10*3/uL — ABNORMAL HIGH (ref 1.7–7.7)
Neutrophils Relative %: 72 %
Platelets: 384 10*3/uL (ref 150–400)
RBC: 3.82 MIL/uL — ABNORMAL LOW (ref 3.87–5.11)
RDW: 15.4 % (ref 11.5–15.5)
WBC: 11.5 10*3/uL — ABNORMAL HIGH (ref 4.0–10.5)
nRBC: 0 % (ref 0.0–0.2)

## 2019-11-23 LAB — COMPREHENSIVE METABOLIC PANEL
ALT: 18 U/L (ref 0–44)
AST: 28 U/L (ref 15–41)
Albumin: 3 g/dL — ABNORMAL LOW (ref 3.5–5.0)
Alkaline Phosphatase: 77 U/L (ref 38–126)
Anion gap: 9 (ref 5–15)
BUN: 21 mg/dL (ref 8–23)
CO2: 26 mmol/L (ref 22–32)
Calcium: 9 mg/dL (ref 8.9–10.3)
Chloride: 100 mmol/L (ref 98–111)
Creatinine, Ser: 0.84 mg/dL (ref 0.44–1.00)
GFR calc Af Amer: >60 mL/min (ref >60)
GFR calc non Af Amer: >60 mL/min (ref >60)
Glucose, Bld: 157 mg/dL — ABNORMAL HIGH (ref 70–99)
Potassium: 4.2 mmol/L (ref 3.5–5.1)
Sodium: 135 mmol/L (ref 135–145)
Total Bilirubin: 0.4 mg/dL (ref 0.3–1.2)
Total Protein: 6.8 g/dL (ref 6.5–8.1)

## 2019-11-23 MED ORDER — SODIUM CHLORIDE 0.9 % IV SOLN
Freq: Once | INTRAVENOUS | Status: AC
  Filled 2019-11-23: qty 250

## 2019-11-23 MED ORDER — HEPARIN SOD (PORK) LOCK FLUSH 100 UNIT/ML IV SOLN
500.0000 [IU] | Freq: Once | INTRAVENOUS | Status: AC
  Administered 2019-11-23: 500 [IU] via INTRAVENOUS
  Filled 2019-11-23: qty 5

## 2019-11-23 MED ORDER — SODIUM CHLORIDE 0.9% FLUSH
10.0000 mL | INTRAVENOUS | Status: AC | PRN
  Administered 2019-11-23: 10 mL via INTRAVENOUS
  Filled 2019-11-23: qty 10

## 2019-11-23 NOTE — Progress Notes (Signed)
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 difficile infection  Heme/Onc history: Patient is a86 year old female with a past medical history significant for stage III ovarian cancer that was treated in [place] and 2013. She underwent neoadjuvant chemotherapy followed by surgery with Dr. Beverly Milch followed by adjuvant chemotherapy. Genetic testing was negative. She then established care at [facility] in 2017. More recently patient was found to have a consistently increasing Ca1 25 level from 42.6 in December 20 18-781 in February 2020. Most recent Ca1 25 was 02/21/2005 on 07/06/2018. Scans were however negative for any recurrence. Patient had CT abdomen and pelvis with contrast done on 07/06/2018 which showed severe left hydro ureteral nephrosis. Sigmoid wall thickening differential includes carcinomatosis and colitis. She did had a repeat scan on 07/09/2018 which showed interval increase in ascites mostly within the pelvis with associated enhancement of the peritoneum suggesting carcinomatosis. Redemonstration of sigmoid wall thickening which may represent colitis versus carcinomatosis. Delayed left nephrogram with severe left hydro-ureteronephrosis similar to prior.  Given findings of colitis as well as ongoing symptoms of melena and diarrhea patient underwent EGD as well as flexible sigmoidoscopy which showed Schatzki's ring and diverticulosis but no evidence of tumor. Her symptoms are possibly secondary to carcinomatosis. She also had other stool studies done including C.  difficile and stool cultures which were negative.Patient did undergo a diagnostic tap of her ascites on 07/09/2018 which was consistent with adenocarcinoma  Patient started cycle 1 with single agent carboplatin on 07/28/2018 and finished 18cycles of weekly carbotaxol on 11/28/2018  Scans in June 2021 showed rising tumor markers and liver metastases. Plan is to switch her to second line Doxil and addcarboplatin if tolerated.Foundation 1 liquid biopsy did not show any evidence of actionable mutations.PD-L1 0%   Interval history-she had a fall on 11/20/2019 when she was walking to the bathroom and hit the side of her face.  EMT was called and she had a Band-Aid placed on a small area of laceration over her left forehead.  She denies any loss of consciousness.  She had a transient headache following that but presently reports no headache.  She is still considerably fatigued from her ongoing C. difficile infection.  Reports that she has no longer had diarrhea.  Stools are well formed.  She is due for stent exchange sometime this month  ECOG PS- 2 Pain scale- 0   Review of systems- Review of Systems  Constitutional: Positive for malaise/fatigue. Negative for chills, fever and weight loss.  HENT: Negative for congestion, ear discharge and nosebleeds.   Eyes: Negative for blurred vision.  Respiratory: Negative for cough, hemoptysis, sputum production, shortness of breath and wheezing.   Cardiovascular: Negative for chest pain, palpitations, orthopnea and claudication.  Gastrointestinal: Negative for abdominal pain, blood in stool, constipation, diarrhea, heartburn, melena, nausea and vomiting.  Genitourinary: Negative for dysuria, flank pain, frequency, hematuria and urgency.  Musculoskeletal: Negative for back pain, joint pain and myalgias.  Skin: Negative for rash.  Neurological: Negative for dizziness, tingling, focal weakness, seizures, weakness and headaches.  Endo/Heme/Allergies: Does not  bruise/bleed easily.  Psychiatric/Behavioral: Negative for depression and suicidal  ideas. The patient does not have insomnia.       Allergies  Allergen Reactions  . Penicillins Anaphylaxis    Has patient had a PCN reaction causing immediate rash, facial/tongue/throat swelling, SOB or lightheadedness with hypotension: Yes Has patient had a PCN reaction causing severe rash involving mucus membranes or skin necrosis: No Has patient had a PCN reaction that required hospitalization No Has patient had a PCN reaction occurring within the last 10 years: No If all of the above answers are "NO", then may proceed with Cephalosporin use.     Past Medical History:  Diagnosis Date  . CAD (coronary artery disease)   . Chronic kidney disease    urinary incontinence  . Hyperthyroidism   . Hypothyroidism (acquired)   . Ovarian cancer ([place]) 2014  . Shingles   . Third degree AV block ([place]) 10/09/2015  . UTI (urinary tract infection)      Past Surgical History:  Procedure Laterality Date  . ABDOMINAL HYSTERECTOMY    . BLADDER REPAIR    . CORONARY ANGIOPLASTY WITH STENT PLACEMENT  2001   Dr. Isaiah Blakes in [place]  . CYSTOURETHROSCOPY, WITH INSERTION OF INDWELLING URETERAL STENT (EG,GIBBONS OR DOUBLE-J TYPE) (Left Ureter Left   . PACEMAKER INSERTION N/A 10/11/2015   Procedure: INSERTION PACEMAKER;  Surgeon: [name], MD;  Location: [facility] OR[facility];  Service: Cardiovascular;  Laterality: N/A;  . PARTIAL HYSTERECTOMY    . PORTA CATH INSERTION N/A 07/24/2018   Procedure: PORTA CATH INSERTION;  Surgeon: Algernon Huxley, MD;  Location: [facility];  Service: Cardiovascular;  Laterality: N/A;    Social History   Socioeconomic History  . Marital status: Widowed    Spouse name: Not on file  . Number of children: Not on file  . Years of education: Not on file  . Highest education level: Not on file  Occupational History  . Not on file  Tobacco Use  . Smoking status: Never Smoker  .  Smokeless tobacco: Never Used  Vaping Use  . Vaping Use: Never used  Substance and Sexual Activity  . Alcohol use: No  . Drug use: No  . Sexual activity: Never  Other Topics Concern  . 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 Difficulty of Paying Living Expenses: Not on file  Food Insecurity:   . Worried About Charity fundraiser in the Last Year: Not on file  . Ran Out of Food in the Last Year: Not on file  Transportation Needs:   . Lack of Transportation (Medical): Not on file  . Lack of Transportation (Non-Medical): Not on file  Physical Activity:   . Days of Exercise per Week: Not on file  . Minutes of Exercise per Session: Not on file  Stress:   . Feeling of Stress : Not on file  Social Connections:   . Frequency of Communication with Friends and Family: Not on file  . Frequency of Social Gatherings with Friends and Family: Not on file  . Attends Religious Services: Not on file  . Active Member of Clubs or Organizations: Not on file  . Attends Archivist Meetings: Not on file  . Marital Status: Not on file  Intimate Partner Violence:   . Fear of Current or Ex-Partner: Not on file  . Emotionally Abused: Not on file  .  Physically Abused: Not on file  . Sexually Abused: Not on file    Family History  Problem Relation Age of Onset  . Diabetes Brother      Current Outpatient Medications:  .  AMERICAN GINSENG PO, Take 2 tablets by mouth daily. 500 mg tab and she takes 2 a day, Disp: , Rfl:  .  aspirin EC 81 MG tablet, Take 81 mg by mouth daily., Disp: , Rfl:  .  atorvastatin (LIPITOR) 10 MG tablet, Take 10 mg by mouth at bedtime. , Disp: , Rfl:  .  Bioflavonoid Products (ESTER C PO), Take 500 mg by mouth daily. , Disp: , Rfl:  .  Coenzyme Q10 (COQ10) 100 MG CAPS, Take 100 mg by mouth daily. , Disp: , Rfl:  .  D-MANNOSE  PO, Take 1 tablet by mouth daily., Disp: , Rfl:  .  Digestive Enzymes (DIGESTIVE ENZYME PO), Take 1 Dose by mouth 3 (three) times daily before meals., Disp: , Rfl:  .  Glucosamine-MSM-Hyaluronic Acd (JOINT HEALTH PO), Take 1 tablet by mouth 2 (two) times a day. , Disp: , Rfl:  .  hydrocortisone 2.5 % cream, Apply 1 application topically daily as needed. , Disp: , Rfl:  .  levothyroxine (SYNTHROID) 100 MCG tablet, Take 100 mcg by mouth daily before breakfast. , Disp: , Rfl:  .  loratadine (CLARITIN) 10 MG tablet, Take 10 mg by mouth as directed. Around the days of neulasta, Disp: , Rfl:  .  mirabegron ER (MYRBETRIQ) 50 MG TB24 tablet, Take 50 mg by mouth daily. , Disp: , Rfl:  .  OLANZapine (ZYPREXA) 10 MG tablet, Take 1 tablet (10 mg total) by mouth at bedtime., Disp: 30 tablet, Rfl: 0 .  ondansetron (ZOFRAN) 4 MG tablet, TAKE 1 TABLET(4 MG) BY MOUTH EVERY 8 HOURS AS NEEDED FOR NAUSEA OR VOMITING, Disp: 90 tablet, Rfl: 0 .  potassium chloride SA (KLOR-CON) 20 MEQ tablet, Take 1 tablet (20 mEq total) by mouth daily., Disp: 10 tablet, Rfl: 0 .  vancomycin (VANCOCIN) 125 MG capsule, Take 1 capsule (125 mg total) by mouth 4 (four) times daily., Disp: 40 capsule, Rfl: 0 .  Zinc 25 MG TABS, Take 1 tablet by mouth daily. , Disp: , Rfl:  .  Cholecalciferol (VITAMIN D3) 1000 units CAPS, Take 1,000 Units by mouth daily.  (Patient not taking: Reported on 11/23/2019), Disp: , Rfl:  .  Cyanocobalamin (VITAMIN B-12) 1000 MCG/15ML LIQD, Take 1 mL by mouth daily. (Patient not taking: Reported on 11/23/2019), Disp: , Rfl:  .  dexamethasone (DECADRON) 4 MG tablet, Take 2 tablets by mouth once a day for 3 days after chemo. Take with food. (Patient not taking: Reported on 11/23/2019), Disp: 30 tablet, Rfl: 1 .  ondansetron (ZOFRAN) 8 MG tablet, Take 1 tablet (8 mg total) by mouth 2 (two) times daily as needed. Start on the third day after chemotherapy. (Patient not taking: Reported on 10/16/2019), Disp: 30 tablet, Rfl: 1 .   prochlorperazine (COMPAZINE) 10 MG tablet, Take 1 tablet (10 mg total) by mouth every 6 (six) hours as needed (Nausea or vomiting). (Patient not taking: Reported on 10/16/2019), Disp: 30 tablet, Rfl: 1 No current facility-administered medications for this visit.  Facility-Administered Medications Ordered in Other Visits:  .  sodium chloride flush (NS) 0.9 % injection 10 mL, 10 mL, Intravenous, PRN, Sindy Guadeloupe, MD, 10 mL at 11/23/19 1050  Physical exam:  Vitals:   11/23/19 1111  BP: 100/82  Pulse: (!) 123  Resp: 16  Temp: 98.4 F (36.9 C)  TempSrc: Oral  Weight: 104 lb 11.2 oz (47.5 kg)  Height: _0  (1.549 m)   Physical Exam Constitutional:      General: She is not in acute distress. Cardiovascular:     Rate and Rhythm: Regular rhythm. Tachycardia present.     Heart sounds: Normal heart sounds.  Pulmonary:     Effort: Pulmonary effort is normal.     Breath sounds: Normal breath sounds.  Abdominal:     General: Bowel sounds are normal.     Palpations: Abdomen is soft.  Skin:    General: Skin is warm and dry.  Neurological:     Mental Status: She is alert and oriented to person, place, and time.      CMP Latest Ref Rng & Units 11/23/2019  Glucose 70 - 99 mg/dL 157(H)  BUN 8 - 23 mg/dL 21  Creatinine 0.44 - 1.00 mg/dL 0.84  Sodium 135 - 145 mmol/L 135  Potassium 3.5 - 5.1 mmol/L 4.2  Chloride 98 - 111 mmol/L 100  CO2 22 - 32 mmol/L 26  Calcium 8.9 - 10.3 mg/dL 9.0  Total Protein 6.5 - 8.1 g/dL 6.8  Total Bilirubin 0.3 - 1.2 mg/dL 0.4  Alkaline Phos 38 - 126 U/L 77  AST 15 - 41 U/L 28  ALT 0 - 44 U/L 18   CBC Latest Ref Rng & Units 11/23/2019  WBC 4.0 - 10.5 K/uL 11.5(H)  Hemoglobin 12.0 - 15.0 g/dL 11.4(L)  Hematocrit 36 - 46 % 34.6(L)  Platelets 150 - 400 K/uL 384      Assessment and plan- Patient is a 86 y.o. female  with recurrent ovarian carcinomawith peritoneal and liver metastases.  She is s/p 2 cycles of Doxil complicated by a C. difficile infection  and here for routine follow-up  C. difficile infection/colitis: Likely secondary to antibiotic use for recurrent UTI.  Patient is currently on day 6 of oral vancomycin and symptoms of diarrhea have resolved.  She will continue to take it for 4 more days.  She is due for her chemotherapy this week which we will cancel  IV fluids today and again on Friday as well as IV fluids 2 times a week next week.  I will see her back in 2 weeks with port labs CBC with differential, CMP for possible Doxil  We will obtain echocardiogram prior Visit Diagnosis 1. Malignant neoplasm of both ovaries   2. Dehydration   3. C. difficile colitis      Dr. Randa Evens, MD, MPH [facility] at [facility] [phone] 11/23/2019 1:37 PM

## 2019-11-23 NOTE — Progress Notes (Signed)
[facility] Dr Janese Banks this morning. Pt is receiving 1 liter of NS for weakness, possible dehydration.

## 2019-11-23 NOTE — Progress Notes (Signed)
Patient was seen by Dr. Janese Banks this AM. She received IVFs. Patient was on my schedule for a virtual visit. I called and spoke with patient/daughter. Will reschedule for when she is here on Friday.

## 2019-11-24 ENCOUNTER — Other Ambulatory Visit: Payer: Medicare HMO

## 2019-11-24 ENCOUNTER — Ambulatory Visit: Payer: Medicare HMO

## 2019-11-24 ENCOUNTER — Ambulatory Visit: Payer: Medicare HMO | Admitting: Oncology

## 2019-11-24 LAB — CA 125: Cancer Antigen (CA) 125: 366 U/mL — ABNORMAL HIGH (ref 0.0–38.1)

## 2019-11-26 ENCOUNTER — Other Ambulatory Visit: Payer: Medicare HMO

## 2019-11-26 ENCOUNTER — Ambulatory Visit: Payer: Medicare HMO | Admitting: Oncology

## 2019-11-26 ENCOUNTER — Ambulatory Visit: Payer: Medicare HMO

## 2019-11-27 ENCOUNTER — Ambulatory Visit: Payer: Medicare HMO

## 2019-11-27 ENCOUNTER — Inpatient Hospital Stay (HOSPITAL_BASED_OUTPATIENT_CLINIC_OR_DEPARTMENT_OTHER): Payer: Medicare HMO | Admitting: Hospice and Palliative Medicine

## 2019-11-27 ENCOUNTER — Other Ambulatory Visit: Payer: Medicare HMO

## 2019-11-27 ENCOUNTER — Ambulatory Visit: Payer: Medicare HMO | Admitting: Oncology

## 2019-11-27 ENCOUNTER — Telehealth: Payer: Self-pay | Admitting: *Deleted

## 2019-11-27 ENCOUNTER — Inpatient Hospital Stay: Payer: Medicare HMO

## 2019-11-27 ENCOUNTER — Other Ambulatory Visit: Payer: Self-pay

## 2019-11-27 VITALS — BP 134/66 | HR 117 | Resp 18

## 2019-11-27 DIAGNOSIS — Z9221 Personal history of antineoplastic chemotherapy: Secondary | ICD-10-CM | POA: Diagnosis not present

## 2019-11-27 DIAGNOSIS — C569 Malignant neoplasm of unspecified ovary: Secondary | ICD-10-CM | POA: Diagnosis not present

## 2019-11-27 DIAGNOSIS — R531 Weakness: Secondary | ICD-10-CM | POA: Diagnosis not present

## 2019-11-27 DIAGNOSIS — Z515 Encounter for palliative care: Secondary | ICD-10-CM | POA: Diagnosis not present

## 2019-11-27 DIAGNOSIS — A0472 Enterocolitis due to Clostridium difficile, not specified as recurrent: Secondary | ICD-10-CM | POA: Diagnosis not present

## 2019-11-27 DIAGNOSIS — C563 Malignant neoplasm of bilateral ovaries: Secondary | ICD-10-CM | POA: Diagnosis not present

## 2019-11-27 DIAGNOSIS — C786 Secondary malignant neoplasm of retroperitoneum and peritoneum: Secondary | ICD-10-CM | POA: Diagnosis not present

## 2019-11-27 DIAGNOSIS — E860 Dehydration: Secondary | ICD-10-CM | POA: Diagnosis not present

## 2019-11-27 DIAGNOSIS — Z66 Do not resuscitate: Secondary | ICD-10-CM | POA: Diagnosis not present

## 2019-11-27 DIAGNOSIS — C787 Secondary malignant neoplasm of liver and intrahepatic bile duct: Secondary | ICD-10-CM | POA: Diagnosis not present

## 2019-11-27 MED ORDER — HEPARIN SOD (PORK) LOCK FLUSH 100 UNIT/ML IV SOLN
500.0000 [IU] | Freq: Once | INTRAVENOUS | Status: AC
  Administered 2019-11-27: 500 [IU] via INTRAVENOUS
  Filled 2019-11-27: qty 5

## 2019-11-27 MED ORDER — SODIUM CHLORIDE 0.9% FLUSH
10.0000 mL | INTRAVENOUS | Status: DC | PRN
  Administered 2019-11-27: 10 mL via INTRAVENOUS
  Filled 2019-11-27: qty 10

## 2019-11-27 MED ORDER — SODIUM CHLORIDE 0.9 % IV SOLN
Freq: Once | INTRAVENOUS | Status: AC
  Filled 2019-11-27: qty 250

## 2019-11-27 NOTE — Progress Notes (Signed)
Pt in [facility] for IVF's this afternoon. Bruising much better around left eye. Pt states she does have ongoing weakness and dizziness. Will receive 1 liter of NS. No diarrhea. Finishing vancomycin prescription. Josh Borders in to visit with pt from palliative care. Gait more steady and denies dizziness when pt was taken to [facility] via wheelchair prior to discharge.

## 2019-11-27 NOTE — Progress Notes (Signed)
[place]  [phone] [phone]   Name: Natalie Stone Date: 11/27/2019 MRN: [id number]  DOB: [birth year]  Patient Care Team: Rusty Aus, MD as PCP - General (Internal Medicine) Clent Jacks, RN as Registered Nurse    REASON FOR CONSULTATION: Palliative Care consult requested for this 86 y.o. female with multiple medical problems including recurrent ovarian cancer status post neoadjuvant chemotherapy and surgery in 2013.  Patient has been followed by Gyn Onc.  She was hospitalized at [facility] 07/05/2018-07/13/2018 with abdominal pain and melena and was found to have hydronephrosis of the left kidney and increasing CA-125 with CT demonstrating carcinomatosis and a pelvic mass concerning for recurrence.  Patient is status post left ureteral stent on 5/20.  Patient completed 18 cycles of weekly carbotaxol in October 2020.  CT in June 2021 showed progressive liver metastases and patient was started on second line Doxil carboplatin.  Patient has had recurrent C. difficile.  Patient is also been referred to palliative care for support.  SOCIAL HISTORY:     reports that she has never smoked. She has never used smokeless tobacco. She reports that she does not drink alcohol and does not use drugs.  Patient is recently widowed having lost her husband in March 2020.  She lives at home with her daughter.  She also has a granddaughter who lives nearby and is involved in her care.  Her granddaughter is an OB/GYN.    Patient formally worked as a Pharmacist, hospital.  She lived in the [place] area for over 40 years and moved here several years ago to be near family.  Patient lives in the independent living section of [facility].  ADVANCE DIRECTIVES:  Not on file  CODE STATUS: DNR/DNI (MOST form in signed on 08/18/2018)  PAST MEDICAL HISTORY: Past Medical History:  Diagnosis Date  . CAD (coronary artery disease)   . Chronic kidney disease     urinary incontinence  . Hyperthyroidism   . Hypothyroidism (acquired)   . Ovarian cancer ([place]) 2014  . Shingles   . Third degree AV block ([place]) 10/09/2015  . UTI (urinary tract infection)     PAST SURGICAL HISTORY:  Past Surgical History:  Procedure Laterality Date  . ABDOMINAL HYSTERECTOMY    . BLADDER REPAIR    . CORONARY ANGIOPLASTY WITH STENT PLACEMENT  2001   Dr. Isaiah Blakes in [place]  . CYSTOURETHROSCOPY, WITH INSERTION OF INDWELLING URETERAL STENT (EG,GIBBONS OR DOUBLE-J TYPE) (Left Ureter Left   . PACEMAKER INSERTION N/A 10/11/2015   Procedure: INSERTION PACEMAKER;  Surgeon: [name], MD;  Location: [facility] ORS;  Service: Cardiovascular;  Laterality: N/A;  . PARTIAL HYSTERECTOMY    . PORTA CATH INSERTION N/A 07/24/2018   Procedure: PORTA CATH INSERTION;  Surgeon: Algernon Huxley, MD;  Location: [facility];  Service: Cardiovascular;  Laterality: N/A;    HEMATOLOGY/ONCOLOGY HISTORY:  Oncology History Overview Note  # NOV 2013-RIGHT OVARIAN CANCER-HIGH GRADE SEROUS CA 125-996 STAGE III [Dr[facility]Stephanie [facility]Jan 2014- neo-adj chemo x3 M;April 2014 TAH & BSO ; chemo x 30M [finished C-6; July 2014]. Ca 125 -13; N  # GENETIC TESTING-"OncoGene Dx"-NEG  # May-June 2020- Recurrent adenocarcinoma [ascites s/p para; [facility]] [phone].   # June 8th 2020- Carbo AUC 2  # may 2020- Left hydro-ureteral -n ephrosis [s/p stenting]; diarrhea-? malignancy s/p sigmoidscopy  [[facility]]  CT- showed severe left hydro ureteral nephrosis.  Sigmoid wall thickening differential includes carcinomatosis and colitis.  She did had a repeat scan on 07/09/2018 which showed interval increase in ascites mostly within the pelvis with associated enhancement of the peritoneum suggesting carcinomatosis.  Redemonstration of sigmoid wall thickening which may represent colitis versus carcinomatosis.  Delayed left nephrogram with severe left hydro-ureteronephrosis similar to prior.   Given  findings of colitis as well as ongoing symptoms of melena and diarrhea patient underwent EGD as well as flexible sigmoidoscopy which showed Schatzki's ring and diverticulosis but no evidence of tumor.  Her symptoms are possibly secondary to carcinomatosis.  She also had other stool studies done including C. difficile and stool cultures which were negative.Patient did undergo a diagnostic tap of her ascites on 07/09/2018 which was consistent with adenocarcinoma  S/p pacemaker [Dr.Paraschos] --------------------------------------   DIAGNOSIS: recurrent ovarian cancer  STAGE: IV; GOALS: palliative  CURRENT/MOST RECENT THERAPY: weekly carbo-Taxol     Malignant neoplasm of ovary ([place])  11/23/2015 Initial Diagnosis   Ovarian cancer, unspecified laterality ([place])   07/28/2018 - 12/05/2018 Chemotherapy   The patient had dexamethasone (DECADRON) 4 MG tablet, 8 mg, Oral, Daily, 1 of 1 cycle, Start date: --, End date: -- palonosetron (ALOXI) injection 0.25 mg, 0.25 mg, Intravenous,  Once, 6 of 6 cycles Administration: 0.25 mg (07/28/2018), 0.25 mg (08/04/2018), 0.25 mg (08/11/2018), 0.25 mg (08/18/2018), 0.25 mg (08/25/2018), 0.25 mg (09/01/2018), 0.25 mg (09/15/2018), 0.25 mg (09/29/2018), 0.25 mg (10/13/2018), 0.25 mg (10/30/2018), 0.25 mg (11/14/2018), 0.25 mg (11/21/2018), 0.25 mg (12/05/2018), 0.25 mg (09/08/2018), 0.25 mg (10/06/2018), 0.25 mg (11/06/2018), 0.25 mg (11/28/2018) CARBOplatin (PARAPLATIN) 110 mg in sodium chloride 0.9 % 100 mL chemo infusion, 110 mg (100 % of original dose 113.4 mg), Intravenous,  Once, 6 of 6 cycles Dose modification:   (original dose 113.4 mg, Cycle 1) Administration: 110 mg (07/28/2018), 110 mg (08/04/2018), 110 mg (08/11/2018), 110 mg (08/18/2018), 110 mg (08/25/2018), 90 mg (09/01/2018), 90 mg (09/15/2018), 90 mg (09/29/2018), 90 mg (10/13/2018), 90 mg (10/30/2018), 80 mg (11/14/2018), 80 mg (11/21/2018), 80 mg (12/05/2018), 90 mg (09/08/2018), 90 mg (10/06/2018), 80 mg (11/06/2018), 80 mg  (11/28/2018) PACLitaxel (TAXOL) 84 mg in sodium chloride 0.9 % 250 mL chemo infusion (</= 56m/m2), 60 mg/m2 = 84 mg (100 % of original dose 60 mg/m2), Intravenous,  Once, 4 of 4 cycles Dose modification: 60 mg/m2 (original dose 60 mg/m2, Cycle 3, Reason: Other (see comments), Comment: modify rate) Administration: 84 mg (09/08/2018), 84 mg (09/15/2018), 84 mg (09/29/2018), 84 mg (10/06/2018), 84 mg (10/13/2018), 84 mg (10/30/2018), 84 mg (11/14/2018), 84 mg (11/21/2018), 84 mg (12/05/2018), 84 mg (11/06/2018), 84 mg (11/28/2018) PACLitaxel (TAXOL) 84 mg in sodium chloride 0.9 % 250 mL chemo infusion (> 875mm2), 60 mg/m2 = 84 mg (100 % of original dose 60 mg/m2), Intravenous,  Once, 2 of 2 cycles Dose modification: 60 mg/m2 (original dose 60 mg/m2, Cycle 1, Reason: Other (see comments), Comment: weekly) Administration: 84 mg (08/18/2018), 84 mg (08/25/2018), 84 mg (09/01/2018) fosaprepitant (EMEND) 150 mg, dexamethasone (DECADRON) 12 mg in sodium chloride 0.9 % 145 mL IVPB, , Intravenous,  Once, 4 of 4 cycles Administration:  (07/28/2018),  (08/04/2018),  (08/11/2018),  (08/18/2018),  (08/25/2018),  (09/01/2018),  (09/15/2018),  (09/29/2018),  (10/13/2018),  (09/08/2018),  (10/06/2018)  for chemotherapy treatment.    10/01/2019 -  Chemotherapy   The patient had dexamethasone (DECADRON) 4 MG tablet, 1 of 1 cycle, Start date: 10/01/2019, End date: -- palonosetron (ALOXI) injection 0.25 mg, 0.25 mg, Intravenous,  Once, 0 of 4 cycles pegfilgrastim (NEULASTA ONPRO KIT) injection 6 mg, 6 mg,  Subcutaneous, Once, 2 of 6 cycles Administration: 6 mg (10/01/2019), 6 mg (10/29/2019) CARBOplatin (PARAPLATIN) in sodium chloride 0.9 % 100 mL chemo infusion, , Intravenous,  Once, 0 of 4 cycles DOXOrubicin HCL LIPOSOMAL (DOXIL) 44 mg in dextrose 5 % 250 mL chemo infusion, 30 mg/m2 = 44 mg, Intravenous,  Once, 2 of 6 cycles Administration: 44 mg (10/01/2019), 44 mg (10/29/2019) fosaprepitant (EMEND) 150 mg in sodium chloride 0.9 % 145 mL IVPB, 150  mg, Intravenous,  Once, 2 of 6 cycles Administration: 150 mg (10/01/2019), 150 mg (10/29/2019)  for chemotherapy treatment.      ALLERGIES:  is allergic to penicillins.  MEDICATIONS:  Current Outpatient Medications  Medication Sig Dispense Refill  . AMERICAN GINSENG PO Take 2 tablets by mouth daily. 500 mg tab and she takes 2 a day    . aspirin EC 81 MG tablet Take 81 mg by mouth daily.    Marland Kitchen atorvastatin (LIPITOR) 10 MG tablet Take 10 mg by mouth at bedtime.     Marland Kitchen Bioflavonoid Products (ESTER C PO) Take 500 mg by mouth daily.     . Cholecalciferol (VITAMIN D3) 1000 units CAPS Take 1,000 Units by mouth daily.  (Patient not taking: Reported on 11/23/2019)    . Coenzyme Q10 (COQ10) 100 MG CAPS Take 100 mg by mouth daily.     . Cyanocobalamin (VITAMIN B-12) 1000 MCG/15ML LIQD Take 1 mL by mouth daily. (Patient not taking: Reported on 11/23/2019)    . D-MANNOSE PO Take 1 tablet by mouth daily.    Marland Kitchen dexamethasone (DECADRON) 4 MG tablet Take 2 tablets by mouth once a day for 3 days after chemo. Take with food. (Patient not taking: Reported on 11/23/2019) 30 tablet 1  . Digestive Enzymes (DIGESTIVE ENZYME PO) Take 1 Dose by mouth 3 (three) times daily before meals.    . Glucosamine-MSM-Hyaluronic Acd (JOINT HEALTH PO) Take 1 tablet by mouth 2 (two) times a day.     . hydrocortisone 2.5 % cream Apply 1 application topically daily as needed.     Marland Kitchen levothyroxine (SYNTHROID) 100 MCG tablet Take 100 mcg by mouth daily before breakfast.     . loratadine (CLARITIN) 10 MG tablet Take 10 mg by mouth as directed. Around the days of neulasta    . mirabegron ER (MYRBETRIQ) 50 MG TB24 tablet Take 50 mg by mouth daily.     Marland Kitchen OLANZapine (ZYPREXA) 10 MG tablet Take 1 tablet (10 mg total) by mouth at bedtime. 30 tablet 0  . ondansetron (ZOFRAN) 4 MG tablet TAKE 1 TABLET(4 MG) BY MOUTH EVERY 8 HOURS AS NEEDED FOR NAUSEA OR VOMITING 90 tablet 0  . ondansetron (ZOFRAN) 8 MG tablet Take 1 tablet (8 mg total) by mouth 2  (two) times daily as needed. Start on the third day after chemotherapy. (Patient not taking: Reported on 10/16/2019) 30 tablet 1  . potassium chloride SA (KLOR-CON) 20 MEQ tablet Take 1 tablet (20 mEq total) by mouth daily. 10 tablet 0  . prochlorperazine (COMPAZINE) 10 MG tablet Take 1 tablet (10 mg total) by mouth every 6 (six) hours as needed (Nausea or vomiting). (Patient not taking: Reported on 10/16/2019) 30 tablet 1  . vancomycin (VANCOCIN) 125 MG capsule Take 1 capsule (125 mg total) by mouth 4 (four) times daily. 40 capsule 0  . Zinc 25 MG TABS Take 1 tablet by mouth daily.      No current facility-administered medications for this visit.   Facility-Administered Medications Ordered in Other Visits  Medication Dose Route Frequency Provider Last Rate Last Admin  . 0.9 %  sodium chloride infusion   Intravenous Once Verlon Au, NP 999 mL/hr at 11/27/19 1323 New Bag at 11/27/19 1323  . heparin lock flush 100 unit/mL  500 Units Intravenous Once Verlon Au, NP      . sodium chloride flush (NS) 0.9 % injection 10 mL  10 mL Intravenous PRN Sindy Guadeloupe, MD   10 mL at 11/23/19 1050  . sodium chloride flush (NS) 0.9 % injection 10 mL  10 mL Intravenous PRN Verlon Au, NP   10 mL at 11/27/19 1322    VITAL SIGNS: There were no vitals taken for this visit. There were no vitals filed for this visit.  Estimated body mass index is 19.78 kg/m as calculated from the following:   Height as of 11/23/19: 5' 1"  (1.549 m).   Weight as of 11/23/19: 104 lb 11.2 oz (47.5 kg).  LABS: CBC:    Component Value Date/Time   WBC 11.5 (H) 11/23/2019 1055   HGB 11.4 (L) 11/23/2019 1055   HCT 34.6 (L) 11/23/2019 1055   PLT 384 11/23/2019 1055   MCV 90.6 11/23/2019 1055   NEUTROABS 8.4 (H) 11/23/2019 1055   LYMPHSABS 1.4 11/23/2019 1055   MONOABS 1.3 (H) 11/23/2019 1055   EOSABS 0.2 11/23/2019 1055   BASOSABS 0.1 11/23/2019 1055   Comprehensive Metabolic Panel:    Component Value Date/Time    NA 135 11/23/2019 1055   K 4.2 11/23/2019 1055   CL 100 11/23/2019 1055   CO2 26 11/23/2019 1055   BUN 21 11/23/2019 1055   CREATININE 0.84 11/23/2019 1055   GLUCOSE 157 (H) 11/23/2019 1055   CALCIUM 9.0 11/23/2019 1055   AST 28 11/23/2019 1055   ALT 18 11/23/2019 1055   ALKPHOS 77 11/23/2019 1055   BILITOT 0.4 11/23/2019 1055   PROT 6.8 11/23/2019 1055   ALBUMIN 3.0 (L) 11/23/2019 1055    RADIOGRAPHIC STUDIES: No results found.  PERFORMANCE STATUS (ECOG) : 1 - Symptomatic but completely ambulatory  Review of Systems Unless otherwise noted, a complete review of systems is negative.  Physical Exam General: NAD, frail appearing, thin Pulmonary: Clear to auscultation anterior fields Cardiac: RRR Abdomen: Soft NTTP Extremities: no edema Skin: no rashes Neurological: Weakness but otherwise nonfocal  IMPRESSION: I met with patient and daughter today in clinic.  Over the past 2 weeks, she has been on treatment for C. difficile colitis.  She continues to take oral vancomycin but reports that her diarrhea has stopped.  She had a formed stool yesterday.  Patient had a recent fall at home striking her left forehead.  She reports persistent weakness but feels better after receiving twice weekly IV fluids.  Plan is for her to have IV fluids today.  She has follow-up scheduled to see Dr. Janese Banks on 12/07/2019.  Today, patient denies any significant symptomatic complaints.  She says that she is still not eating and drinking much.  She is drinking about 1 oral supplements per day.  I recommended that she increase dose to 3 times daily.  Regarding weakness, will order her home health PT/OT.  We will also order her a new Rollator walker.  Daughter questioned getting her hospital bed and asked about future equipment coverage under hospice.  However, there are no current plans to initiate hospice care.  We could try ordering a hospital bed under her insurance if necessary.  Daughter also plans  to enact  patient's long-term care insurance policy.  PLAN: -Continue current scope of treatment. -1 L normal saline via IV today -Increase oral supplements to 3 times daily -Home health PT/OT -Rollator walker -Patient to return to [facility] next week for IV fluids and then will see Dr. Janese Banks and me on 10/18   Patient expressed understanding and was in agreement with this plan. She also understands that She can call the clinic at any time with any questions, concerns, or complaints.     Time Total: 25 minutes  Visit consisted of counseling and education dealing with the complex and emotionally intense issues of symptom management and palliative care in the setting of serious and potentially life-threatening illness.Greater than 50%  of this time was spent counseling and coordinating care related to the above assessment and plan.  Signed by: Altha Harm, PhD, NP-C [phone] (Work Cell)

## 2019-11-27 NOTE — Telephone Encounter (Signed)
Called Jason at [facility] and pt has Mcarthur Rossetti so it will be sent to [facility] or [facility] for OT/PT. He also sees that there is equipment- rollator and he will send the info to [facility] for her to get rollator walker. All the orders are inside [employer] and that is all that he needs

## 2019-11-29 ENCOUNTER — Other Ambulatory Visit: Payer: Self-pay | Admitting: Oncology

## 2019-11-30 ENCOUNTER — Telehealth: Payer: Self-pay | Admitting: Hospice and Palliative Medicine

## 2019-11-30 ENCOUNTER — Other Ambulatory Visit: Payer: Self-pay

## 2019-11-30 ENCOUNTER — Telehealth: Payer: Self-pay | Admitting: *Deleted

## 2019-11-30 ENCOUNTER — Inpatient Hospital Stay: Payer: Medicare HMO

## 2019-11-30 VITALS — BP 138/62 | HR 108 | Temp 99.8°F | Resp 18

## 2019-11-30 DIAGNOSIS — Z9221 Personal history of antineoplastic chemotherapy: Secondary | ICD-10-CM | POA: Diagnosis not present

## 2019-11-30 DIAGNOSIS — E860 Dehydration: Secondary | ICD-10-CM

## 2019-11-30 DIAGNOSIS — R531 Weakness: Secondary | ICD-10-CM | POA: Diagnosis not present

## 2019-11-30 DIAGNOSIS — C569 Malignant neoplasm of unspecified ovary: Secondary | ICD-10-CM | POA: Diagnosis not present

## 2019-11-30 DIAGNOSIS — A0472 Enterocolitis due to Clostridium difficile, not specified as recurrent: Secondary | ICD-10-CM | POA: Diagnosis not present

## 2019-11-30 DIAGNOSIS — C787 Secondary malignant neoplasm of liver and intrahepatic bile duct: Secondary | ICD-10-CM | POA: Diagnosis not present

## 2019-11-30 DIAGNOSIS — C786 Secondary malignant neoplasm of retroperitoneum and peritoneum: Secondary | ICD-10-CM | POA: Diagnosis not present

## 2019-11-30 DIAGNOSIS — Z66 Do not resuscitate: Secondary | ICD-10-CM | POA: Diagnosis not present

## 2019-11-30 DIAGNOSIS — T451X5A Adverse effect of antineoplastic and immunosuppressive drugs, initial encounter: Secondary | ICD-10-CM | POA: Diagnosis not present

## 2019-11-30 DIAGNOSIS — D701 Agranulocytosis secondary to cancer chemotherapy: Secondary | ICD-10-CM | POA: Diagnosis not present

## 2019-11-30 DIAGNOSIS — Z515 Encounter for palliative care: Secondary | ICD-10-CM | POA: Diagnosis not present

## 2019-11-30 MED ORDER — SODIUM CHLORIDE 0.9 % IV SOLN
Freq: Once | INTRAVENOUS | Status: DC
  Filled 2019-11-30: qty 250

## 2019-11-30 MED ORDER — HEPARIN SOD (PORK) LOCK FLUSH 100 UNIT/ML IV SOLN
INTRAVENOUS | Status: AC
  Filled 2019-11-30: qty 5

## 2019-11-30 MED ORDER — SODIUM CHLORIDE 0.9 % IV SOLN
Freq: Once | INTRAVENOUS | Status: AC
  Filled 2019-11-30: qty 250

## 2019-11-30 MED ORDER — HEPARIN SOD (PORK) LOCK FLUSH 100 UNIT/ML IV SOLN
500.0000 [IU] | Freq: Once | INTRAVENOUS | Status: AC
  Administered 2019-11-30: 500 [IU] via INTRAVENOUS
  Filled 2019-11-30: qty 5

## 2019-11-30 NOTE — Progress Notes (Signed)
Pt reports she fell on Friday and has brusing on the left side of the face and states hip is tender to touch (no pain at this time). Pt reports that they called the paramedics and they came and checked patient, but pt did not go to the hospital. Pt also reports having trouble sleeping at night ("started a few nights ago"). Tympanic temp is 101, oral temp 99.8. Dr. Janese Banks aware. Per Dr. Janese Banks proceed with IVFs at this time, Beckey Rutter NP to see pt at chairside.   1605: Patient's daughter states that pt has had an increase in urine frequency. Dr. Janese Banks and Merrily Pew NP aware.  NNO at this time. Pt stable at discharge.

## 2019-11-30 NOTE — Telephone Encounter (Signed)
I called and spoke with patient's daughter by phone.  She reports that patient has remained profoundly weak over the past week.  She is eating and drinking little and sleeping much of the day.  Daughter is still interested in trying physical therapy but recognizes that patient would have to have some physical reserve to participate.  Daughter still plans to contact her long-term care insurance policy to explore options for increased help at home.  We also talked about the possible option of transitioning patient from independent to assisted living.  Daughter plans to speak with Douglass Rivers about that option.  Plan is for patient to see Dr. Janese Banks next week.  Daughter says that patient may be willing to accept hospice if Dr. Janese Banks feels that treatment is no longer a viable option.

## 2019-11-30 NOTE — Telephone Encounter (Signed)
Dr Janese Banks wanted me to have daughter come pick up urine cup and try to get sample tom.Marland Kitchen afternoon and then we will decide if pt needs atb. She gets frequent UTI. Left kit at desk for her to pick up tom. -all of this was left on voicemail of cell phone

## 2019-12-01 ENCOUNTER — Other Ambulatory Visit: Payer: Self-pay

## 2019-12-01 ENCOUNTER — Ambulatory Visit
Admission: RE | Admit: 2019-12-01 | Discharge: 2019-12-01 | Disposition: A | Discharge status: Home / Self Care | Payer: Medicare HMO | Source: Ambulatory Visit | Attending: Oncology | Admitting: Oncology

## 2019-12-01 DIAGNOSIS — I517 Cardiomegaly: Secondary | ICD-10-CM | POA: Diagnosis not present

## 2019-12-01 DIAGNOSIS — Z950 Presence of cardiac pacemaker: Secondary | ICD-10-CM | POA: Insufficient documentation

## 2019-12-01 DIAGNOSIS — I442 Atrioventricular block, complete: Secondary | ICD-10-CM | POA: Diagnosis not present

## 2019-12-01 DIAGNOSIS — Z01818 Encounter for other preprocedural examination: Secondary | ICD-10-CM | POA: Insufficient documentation

## 2019-12-01 DIAGNOSIS — I2510 Atherosclerotic heart disease of native coronary artery without angina pectoris: Secondary | ICD-10-CM | POA: Insufficient documentation

## 2019-12-01 DIAGNOSIS — Z01810 Encounter for preprocedural cardiovascular examination: Secondary | ICD-10-CM | POA: Diagnosis not present

## 2019-12-01 DIAGNOSIS — N189 Chronic kidney disease, unspecified: Secondary | ICD-10-CM | POA: Insufficient documentation

## 2019-12-01 DIAGNOSIS — C563 Malignant neoplasm of bilateral ovaries: Secondary | ICD-10-CM | POA: Insufficient documentation

## 2019-12-01 LAB — ECHOCARDIOGRAM COMPLETE
AR max vel: 3.23 cm2
AV Area VTI: 3.38 cm2
AV Area mean vel: 3.18 cm2
AV Mean grad: 3 mmHg
AV Peak grad: 4.4 mmHg
Ao pk vel: 1.05 m/s
Area-P 1/2: 6.9 cm2
Calc EF: 64.8 %
S' Lateral: 2.59 cm
Single Plane A2C EF: 55.1 %
Single Plane A4C EF: 70.6 %

## 2019-12-01 NOTE — Progress Notes (Signed)
*  PRELIMINARY RESULTS* Echocardiogram 2D Echocardiogram has been performed.  Natalie Stone 12/01/2019, 10:54 AM

## 2019-12-03 ENCOUNTER — Other Ambulatory Visit: Payer: Self-pay

## 2019-12-03 ENCOUNTER — Other Ambulatory Visit: Payer: Self-pay | Admitting: *Deleted

## 2019-12-03 DIAGNOSIS — Z515 Encounter for palliative care: Secondary | ICD-10-CM | POA: Diagnosis not present

## 2019-12-03 DIAGNOSIS — R300 Dysuria: Secondary | ICD-10-CM

## 2019-12-03 DIAGNOSIS — A0472 Enterocolitis due to Clostridium difficile, not specified as recurrent: Secondary | ICD-10-CM | POA: Diagnosis not present

## 2019-12-03 DIAGNOSIS — R350 Frequency of micturition: Secondary | ICD-10-CM

## 2019-12-03 DIAGNOSIS — Z9221 Personal history of antineoplastic chemotherapy: Secondary | ICD-10-CM | POA: Diagnosis not present

## 2019-12-03 DIAGNOSIS — R531 Weakness: Secondary | ICD-10-CM | POA: Diagnosis not present

## 2019-12-03 DIAGNOSIS — C787 Secondary malignant neoplasm of liver and intrahepatic bile duct: Secondary | ICD-10-CM | POA: Diagnosis not present

## 2019-12-03 DIAGNOSIS — Z66 Do not resuscitate: Secondary | ICD-10-CM | POA: Diagnosis not present

## 2019-12-03 DIAGNOSIS — C569 Malignant neoplasm of unspecified ovary: Secondary | ICD-10-CM | POA: Diagnosis not present

## 2019-12-03 DIAGNOSIS — E860 Dehydration: Secondary | ICD-10-CM | POA: Diagnosis not present

## 2019-12-03 DIAGNOSIS — C786 Secondary malignant neoplasm of retroperitoneum and peritoneum: Secondary | ICD-10-CM | POA: Diagnosis not present

## 2019-12-03 LAB — URINALYSIS, COMPLETE (UACMP) WITH MICROSCOPIC
Bilirubin Urine: NEGATIVE
Glucose, UA: NEGATIVE mg/dL
Ketones, ur: NEGATIVE mg/dL
Nitrite: POSITIVE — AB
Protein, ur: 30 mg/dL — AB
Specific Gravity, Urine: 1.013 (ref 1.005–1.030)
Squamous Epithelial / HPF: NONE SEEN (ref 0–5)
WBC, UA: >50 WBC/hpf — ABNORMAL HIGH (ref 0–5)
pH: 6 (ref 5.0–8.0)

## 2019-12-04 ENCOUNTER — Inpatient Hospital Stay: Payer: Medicare HMO

## 2019-12-04 ENCOUNTER — Telehealth: Payer: Self-pay | Admitting: *Deleted

## 2019-12-04 ENCOUNTER — Other Ambulatory Visit: Payer: Self-pay | Admitting: Nurse Practitioner

## 2019-12-04 ENCOUNTER — Other Ambulatory Visit: Payer: Self-pay

## 2019-12-04 VITALS — BP 114/65 | HR 73 | Temp 98.1°F | Resp 18

## 2019-12-04 DIAGNOSIS — R531 Weakness: Secondary | ICD-10-CM | POA: Diagnosis not present

## 2019-12-04 DIAGNOSIS — N3000 Acute cystitis without hematuria: Secondary | ICD-10-CM

## 2019-12-04 DIAGNOSIS — C569 Malignant neoplasm of unspecified ovary: Secondary | ICD-10-CM | POA: Diagnosis not present

## 2019-12-04 DIAGNOSIS — A0472 Enterocolitis due to Clostridium difficile, not specified as recurrent: Secondary | ICD-10-CM | POA: Diagnosis not present

## 2019-12-04 DIAGNOSIS — Z515 Encounter for palliative care: Secondary | ICD-10-CM | POA: Diagnosis not present

## 2019-12-04 DIAGNOSIS — E860 Dehydration: Secondary | ICD-10-CM | POA: Diagnosis not present

## 2019-12-04 DIAGNOSIS — C786 Secondary malignant neoplasm of retroperitoneum and peritoneum: Secondary | ICD-10-CM | POA: Diagnosis not present

## 2019-12-04 DIAGNOSIS — Z9221 Personal history of antineoplastic chemotherapy: Secondary | ICD-10-CM | POA: Diagnosis not present

## 2019-12-04 DIAGNOSIS — C787 Secondary malignant neoplasm of liver and intrahepatic bile duct: Secondary | ICD-10-CM | POA: Diagnosis not present

## 2019-12-04 DIAGNOSIS — Z66 Do not resuscitate: Secondary | ICD-10-CM | POA: Diagnosis not present

## 2019-12-04 MED ORDER — SODIUM CHLORIDE 0.9 % IV SOLN
INTRAVENOUS | Status: AC
  Filled 2019-12-04: qty 250

## 2019-12-04 MED ORDER — SODIUM CHLORIDE 0.9 % IV SOLN
Freq: Once | INTRAVENOUS | Status: AC
  Filled 2019-12-04: qty 250

## 2019-12-04 MED ORDER — CIPROFLOXACIN HCL 250 MG PO TABS: 250.0000 mg | ORAL_TABLET | Freq: Two times a day (BID) | ORAL | 0 refills | Status: AC

## 2019-12-04 MED ORDER — HEPARIN SOD (PORK) LOCK FLUSH 100 UNIT/ML IV SOLN
INTRAVENOUS | Status: AC
  Filled 2019-12-04: qty 5

## 2019-12-04 MED ORDER — CIPROFLOXACIN IN D5W 400 MG/200ML IV SOLN
400.0000 mg | Freq: Two times a day (BID) | INTRAVENOUS | Status: DC
  Administered 2019-12-04: 400 mg via INTRAVENOUS
  Filled 2019-12-04: qty 200

## 2019-12-04 MED ORDER — HEPARIN SOD (PORK) LOCK FLUSH 100 UNIT/ML IV SOLN
500.0000 [IU] | Freq: Once | INTRAVENOUS | Status: AC
  Administered 2019-12-04: 500 [IU]
  Filled 2019-12-04: qty 5

## 2019-12-04 NOTE — Telephone Encounter (Signed)
Mel with [facility] asking for order approval for PT for this patient 2wk2, 1 wk4. Please advise

## 2019-12-04 NOTE — Telephone Encounter (Signed)
VERBAL ORDER called to [facility] for order approval

## 2019-12-04 NOTE — Telephone Encounter (Signed)
Yes ok to do that

## 2019-12-04 NOTE — Progress Notes (Signed)
Pt transferred to [facility] to receive IV antibiotics this afternoon. Tolerated well. Discharged via wheelchair with daughter to home. Prescription for oral cipro given to daughter.

## 2019-12-05 LAB — URINE CULTURE: Culture: >=100000 — AB

## 2019-12-07 ENCOUNTER — Inpatient Hospital Stay (HOSPITAL_BASED_OUTPATIENT_CLINIC_OR_DEPARTMENT_OTHER): Payer: Medicare HMO | Admitting: Oncology

## 2019-12-07 ENCOUNTER — Inpatient Hospital Stay: Payer: Medicare HMO

## 2019-12-07 ENCOUNTER — Other Ambulatory Visit: Payer: Self-pay

## 2019-12-07 ENCOUNTER — Inpatient Hospital Stay (HOSPITAL_BASED_OUTPATIENT_CLINIC_OR_DEPARTMENT_OTHER): Payer: Medicare HMO | Admitting: Hospice and Palliative Medicine

## 2019-12-07 DIAGNOSIS — C569 Malignant neoplasm of unspecified ovary: Secondary | ICD-10-CM

## 2019-12-07 DIAGNOSIS — Z515 Encounter for palliative care: Secondary | ICD-10-CM

## 2019-12-07 DIAGNOSIS — A0472 Enterocolitis due to Clostridium difficile, not specified as recurrent: Secondary | ICD-10-CM | POA: Diagnosis not present

## 2019-12-07 DIAGNOSIS — Z7189 Other specified counseling: Secondary | ICD-10-CM

## 2019-12-07 DIAGNOSIS — Z66 Do not resuscitate: Secondary | ICD-10-CM | POA: Diagnosis not present

## 2019-12-07 DIAGNOSIS — E860 Dehydration: Secondary | ICD-10-CM | POA: Diagnosis not present

## 2019-12-07 DIAGNOSIS — R531 Weakness: Secondary | ICD-10-CM | POA: Diagnosis not present

## 2019-12-07 DIAGNOSIS — C787 Secondary malignant neoplasm of liver and intrahepatic bile duct: Secondary | ICD-10-CM | POA: Diagnosis not present

## 2019-12-07 DIAGNOSIS — C786 Secondary malignant neoplasm of retroperitoneum and peritoneum: Secondary | ICD-10-CM | POA: Diagnosis not present

## 2019-12-07 DIAGNOSIS — Z9221 Personal history of antineoplastic chemotherapy: Secondary | ICD-10-CM | POA: Diagnosis not present

## 2019-12-07 LAB — COMPREHENSIVE METABOLIC PANEL
ALT: 13 U/L (ref 0–44)
AST: 23 U/L (ref 15–41)
Albumin: 2.9 g/dL — ABNORMAL LOW (ref 3.5–5.0)
Alkaline Phosphatase: 61 U/L (ref 38–126)
Anion gap: 5 (ref 5–15)
BUN: 15 mg/dL (ref 8–23)
CO2: 29 mmol/L (ref 22–32)
Calcium: 8.9 mg/dL (ref 8.9–10.3)
Chloride: 101 mmol/L (ref 98–111)
Creatinine, Ser: 0.74 mg/dL (ref 0.44–1.00)
GFR, Estimated: >60 mL/min (ref >60)
Glucose, Bld: 133 mg/dL — ABNORMAL HIGH (ref 70–99)
Potassium: 3.8 mmol/L (ref 3.5–5.1)
Sodium: 135 mmol/L (ref 135–145)
Total Bilirubin: 0.7 mg/dL (ref 0.3–1.2)
Total Protein: 6.5 g/dL (ref 6.5–8.1)

## 2019-12-07 LAB — CBC WITH DIFFERENTIAL/PLATELET
Abs Immature Granulocytes: 0.07 10*3/uL (ref 0.00–0.07)
Basophils Absolute: 0.1 10*3/uL (ref 0.0–0.1)
Basophils Relative: 0 %
Eosinophils Absolute: 0.2 10*3/uL (ref 0.0–0.5)
Eosinophils Relative: 2 %
HCT: 33.8 % — ABNORMAL LOW (ref 36.0–46.0)
Hemoglobin: 11.4 g/dL — ABNORMAL LOW (ref 12.0–15.0)
Immature Granulocytes: 1 %
Lymphocytes Relative: 10 %
Lymphs Abs: 1.2 10*3/uL (ref 0.7–4.0)
MCH: 30.2 pg (ref 26.0–34.0)
MCHC: 33.7 g/dL (ref 30.0–36.0)
MCV: 89.4 fL (ref 80.0–100.0)
Monocytes Absolute: 1.1 10*3/uL — ABNORMAL HIGH (ref 0.1–1.0)
Monocytes Relative: 9 %
Neutro Abs: 9.2 10*3/uL — ABNORMAL HIGH (ref 1.7–7.7)
Neutrophils Relative %: 78 %
Platelets: 286 10*3/uL (ref 150–400)
RBC: 3.78 MIL/uL — ABNORMAL LOW (ref 3.87–5.11)
RDW: 15.9 % — ABNORMAL HIGH (ref 11.5–15.5)
WBC: 11.8 10*3/uL — ABNORMAL HIGH (ref 4.0–10.5)
nRBC: 0 % (ref 0.0–0.2)

## 2019-12-07 MED ORDER — SODIUM CHLORIDE 0.9% FLUSH
10.0000 mL | Freq: Once | INTRAVENOUS | Status: AC
  Administered 2019-12-07: 10 mL via INTRAVENOUS
  Filled 2019-12-07: qty 10

## 2019-12-07 MED ORDER — HEPARIN SOD (PORK) LOCK FLUSH 100 UNIT/ML IV SOLN
500.0000 [IU] | Freq: Once | INTRAVENOUS | Status: AC
  Administered 2019-12-07: 500 [IU] via INTRAVENOUS
  Filled 2019-12-07: qty 5

## 2019-12-07 MED ORDER — SODIUM CHLORIDE 0.9 % IV SOLN
Freq: Once | INTRAVENOUS | Status: AC
  Filled 2019-12-07: qty 250

## 2019-12-07 MED ORDER — HEPARIN SOD (PORK) LOCK FLUSH 100 UNIT/ML IV SOLN
INTRAVENOUS | Status: AC
  Filled 2019-12-07: qty 5

## 2019-12-07 NOTE — Progress Notes (Signed)
[place]  [phone] [phone]   Name: Natalie Stone Date: 12/07/2019 MRN: [id number]  DOB: 1927-01-17  Patient Care Team: Rusty Aus, MD as PCP - General (Internal Medicine) Clent Jacks, RN as Registered Nurse    REASON FOR CONSULTATION: Palliative Care consult requested for this 87 y.o. female with multiple medical problems including recurrent ovarian cancer status post neoadjuvant chemotherapy and surgery in 2013.  Patient has been followed by Gyn Onc.  She was hospitalized at [facility] 07/05/2018-07/13/2018 with abdominal pain and melena and was found to have hydronephrosis of the left kidney and increasing CA-125 with CT demonstrating carcinomatosis and a pelvic mass concerning for recurrence.  Patient is status post left ureteral stent on 5/20.  Patient completed 18 cycles of weekly carbotaxol in October 2020.  CT in June 2021 showed progressive liver metastases and patient was started on second line Doxil carboplatin.  Patient has had recurrent C. difficile.  Patient is also been referred to palliative care for support.  SOCIAL HISTORY:     reports that she has never smoked. She has never used smokeless tobacco. She reports that she does not drink alcohol and does not use drugs.  Patient is recently widowed having lost her husband in March 2020.  She lives at home with her daughter.  She also has a granddaughter who lives nearby and is involved in her care.  Her granddaughter is an OB/GYN.    Patient formally worked as a Pharmacist, hospital.  She lived in the [place] area for over 40 years and moved here several years ago to be near family.  Patient lives in the independent living section of [employer].  ADVANCE DIRECTIVES:  Not on file  CODE STATUS: DNR/DNI (MOST form in signed on 08/18/2018 and again on 12/07/2019)  PAST MEDICAL HISTORY: Past Medical History:  Diagnosis Date  . CAD (coronary artery disease)   .  Chronic kidney disease    urinary incontinence  . Hyperthyroidism   . Hypothyroidism (acquired)   . Ovarian cancer ([place]) 2014  . Shingles   . Third degree AV block ([place]) 10/09/2015  . UTI (urinary tract infection)     PAST SURGICAL HISTORY:  Past Surgical History:  Procedure Laterality Date  . ABDOMINAL HYSTERECTOMY    . BLADDER REPAIR    . CORONARY ANGIOPLASTY WITH STENT PLACEMENT  2001   Dr. Isaiah Blakes in [place]  . CYSTOURETHROSCOPY, WITH INSERTION OF INDWELLING URETERAL STENT (EG,GIBBONS OR DOUBLE-J TYPE) (Left Ureter Left   . PACEMAKER INSERTION N/A 10/11/2015   Procedure: INSERTION PACEMAKER;  Surgeon: [name], MD;  Location: [facility] ORS;  Service: Cardiovascular;  Laterality: N/A;  . PARTIAL HYSTERECTOMY    . PORTA CATH INSERTION N/A 07/24/2018   Procedure: PORTA CATH INSERTION;  Surgeon: Algernon Huxley, MD;  Location: [facility];  Service: Cardiovascular;  Laterality: N/A;    HEMATOLOGY/ONCOLOGY HISTORY:  Oncology History Overview Note  # NOV 2013-RIGHT OVARIAN CANCER-HIGH GRADE SEROUS CA 125-996 STAGE III [Dr.Stephanie [facility]];Jan 2014- neo-adj chemo x3 M;April 2014 TAH & BSO ; chemo x 73M [finished C-6; July 2014]. Ca 125 -13; N  # GENETIC TESTING-"OncoGene Dx"-NEG  # May-June 2020- Recurrent adenocarcinoma [ascites s/p para; [facility]] [phone].   # June 8th 2020- Carbo AUC 2  # may 2020- Left hydro-ureteral -n ephrosis [s/p stenting]; diarrhea-? malignancy s/p sigmoidscopy  [[facility]]  CT- showed severe left hydro ureteral nephrosis.  Sigmoid wall thickening differential includes  carcinomatosis and colitis.  She did had a repeat scan on 07/09/2018 which showed interval increase in ascites mostly within the pelvis with associated enhancement of the peritoneum suggesting carcinomatosis.  Redemonstration of sigmoid wall thickening which may represent colitis versus carcinomatosis.  Delayed left nephrogram with severe left hydro-ureteronephrosis  similar to prior.   Given findings of colitis as well as ongoing symptoms of melena and diarrhea patient underwent EGD as well as flexible sigmoidoscopy which showed Schatzki's ring and diverticulosis but no evidence of tumor.  Her symptoms are possibly secondary to carcinomatosis.  She also had other stool studies done including C. difficile and stool cultures which were negative.Patient did undergo a diagnostic tap of her ascites on 07/09/2018 which was consistent with adenocarcinoma  S/p pacemaker [Dr.Paraschos] --------------------------------------   DIAGNOSIS: recurrent ovarian cancer  STAGE: IV; GOALS: palliative  CURRENT/MOST RECENT THERAPY: weekly carbo-Taxol     Malignant neoplasm of ovary ([place])  11/23/2015 Initial Diagnosis   Ovarian cancer, unspecified laterality ([place])   07/28/2018 - 12/05/2018 Chemotherapy   The patient had dexamethasone (DECADRON) 4 MG tablet, 8 mg, Oral, Daily, 1 of 1 cycle, Start date: --, End date: -- palonosetron (ALOXI) injection 0.25 mg, 0.25 mg, Intravenous,  Once, 6 of 6 cycles Administration: 0.25 mg (07/28/2018), 0.25 mg (08/04/2018), 0.25 mg (08/11/2018), 0.25 mg (08/18/2018), 0.25 mg (08/25/2018), 0.25 mg (09/01/2018), 0.25 mg (09/15/2018), 0.25 mg (09/29/2018), 0.25 mg (10/13/2018), 0.25 mg (10/30/2018), 0.25 mg (11/14/2018), 0.25 mg (11/21/2018), 0.25 mg (12/05/2018), 0.25 mg (09/08/2018), 0.25 mg (10/06/2018), 0.25 mg (11/06/2018), 0.25 mg (11/28/2018) CARBOplatin (PARAPLATIN) 110 mg in sodium chloride 0.9 % 100 mL chemo infusion, 110 mg (100 % of original dose 113.4 mg), Intravenous,  Once, 6 of 6 cycles Dose modification:   (original dose 113.4 mg, Cycle 1) Administration: 110 mg (07/28/2018), 110 mg (08/04/2018), 110 mg (08/11/2018), 110 mg (08/18/2018), 110 mg (08/25/2018), 90 mg (09/01/2018), 90 mg (09/15/2018), 90 mg (09/29/2018), 90 mg (10/13/2018), 90 mg (10/30/2018), 80 mg (11/14/2018), 80 mg (11/21/2018), 80 mg (12/05/2018), 90 mg (09/08/2018), 90 mg (10/06/2018), 80 mg  (11/06/2018), 80 mg (11/28/2018) PACLitaxel (TAXOL) 84 mg in sodium chloride 0.9 % 250 mL chemo infusion (</= $RemoveBefor'80mg'YvLKbcYRYoGu$ /m2), 60 mg/m2 = 84 mg (100 % of original dose 60 mg/m2), Intravenous,  Once, 4 of 4 cycles Dose modification: 60 mg/m2 (original dose 60 mg/m2, Cycle 3, Reason: Other (see comments), Comment: modify rate) Administration: 84 mg (09/08/2018), 84 mg (09/15/2018), 84 mg (09/29/2018), 84 mg (10/06/2018), 84 mg (10/13/2018), 84 mg (10/30/2018), 84 mg (11/14/2018), 84 mg (11/21/2018), 84 mg (12/05/2018), 84 mg (11/06/2018), 84 mg (11/28/2018) PACLitaxel (TAXOL) 84 mg in sodium chloride 0.9 % 250 mL chemo infusion (> $RemoveBef'80mg'zeJCWJkFtd$ /m2), 60 mg/m2 = 84 mg (100 % of original dose 60 mg/m2), Intravenous,  Once, 2 of 2 cycles Dose modification: 60 mg/m2 (original dose 60 mg/m2, Cycle 1, Reason: Other (see comments), Comment: weekly) Administration: 84 mg (08/18/2018), 84 mg (08/25/2018), 84 mg (09/01/2018) fosaprepitant (EMEND) 150 mg, dexamethasone (DECADRON) 12 mg in sodium chloride 0.9 % 145 mL IVPB, , Intravenous,  Once, 4 of 4 cycles Administration:  (07/28/2018),  (08/04/2018),  (08/11/2018),  (08/18/2018),  (08/25/2018),  (09/01/2018),  (09/15/2018),  (09/29/2018),  (10/13/2018),  (09/08/2018),  (10/06/2018)  for chemotherapy treatment.    10/01/2019 -  Chemotherapy   The patient had dexamethasone (DECADRON) 4 MG tablet, 1 of 1 cycle, Start date: 10/01/2019, End date: -- palonosetron (ALOXI) injection 0.25 mg, 0.25 mg, Intravenous,  Once, 0 of 4 cycles pegfilgrastim (NEULASTA ONPRO KIT) injection  6 mg, 6 mg, Subcutaneous, Once, 2 of 6 cycles Administration: 6 mg (10/01/2019), 6 mg (10/29/2019) CARBOplatin (PARAPLATIN) in sodium chloride 0.9 % 100 mL chemo infusion, , Intravenous,  Once, 0 of 4 cycles DOXOrubicin HCL LIPOSOMAL (DOXIL) 44 mg in dextrose 5 % 250 mL chemo infusion, 30 mg/m2 = 44 mg, Intravenous,  Once, 2 of 6 cycles Administration: 44 mg (10/01/2019), 44 mg (10/29/2019) fosaprepitant (EMEND) 150 mg in sodium chloride 0.9 %  145 mL IVPB, 150 mg, Intravenous,  Once, 2 of 6 cycles Administration: 150 mg (10/01/2019), 150 mg (10/29/2019)  for chemotherapy treatment.      ALLERGIES:  is allergic to penicillins.  MEDICATIONS:  Current Outpatient Medications  Medication Sig Dispense Refill  . AMERICAN GINSENG PO Take 2 tablets by mouth daily. 500 mg tab and she takes 2 a day    . aspirin EC 81 MG tablet Take 81 mg by mouth daily.    Marland Kitchen atorvastatin (LIPITOR) 10 MG tablet Take 10 mg by mouth at bedtime.     Marland Kitchen Bioflavonoid Products (ESTER C PO) Take 500 mg by mouth daily.     . Cholecalciferol (VITAMIN D3) 1000 units CAPS Take 1,000 Units by mouth daily.  (Patient not taking: Reported on 11/23/2019)    . ciprofloxacin (CIPRO) 250 MG tablet Take 1 tablet (250 mg total) by mouth 2 (two) times daily for 5 days. 10 tablet 0  . Coenzyme Q10 (COQ10) 100 MG CAPS Take 100 mg by mouth daily.     . Cyanocobalamin (VITAMIN B-12) 1000 MCG/15ML LIQD Take 1 mL by mouth daily. (Patient not taking: Reported on 11/23/2019)    . D-MANNOSE PO Take 1 tablet by mouth daily.    Marland Kitchen dexamethasone (DECADRON) 4 MG tablet Take 2 tablets by mouth once a day for 3 days after chemo. Take with food. (Patient not taking: Reported on 11/23/2019) 30 tablet 1  . Digestive Enzymes (DIGESTIVE ENZYME PO) Take 1 Dose by mouth 3 (three) times daily before meals.    . Glucosamine-MSM-Hyaluronic Acd (JOINT HEALTH PO) Take 1 tablet by mouth 2 (two) times a day.     . hydrocortisone 2.5 % cream Apply 1 application topically daily as needed.     Marland Kitchen levothyroxine (SYNTHROID) 100 MCG tablet Take 100 mcg by mouth daily before breakfast.     . loratadine (CLARITIN) 10 MG tablet Take 10 mg by mouth as directed. Around the days of neulasta    . mirabegron ER (MYRBETRIQ) 50 MG TB24 tablet Take 50 mg by mouth daily.     Marland Kitchen OLANZapine (ZYPREXA) 10 MG tablet TAKE 1 TABLET(10 MG) BY MOUTH AT BEDTIME 30 tablet 0  . ondansetron (ZOFRAN) 4 MG tablet TAKE 1 TABLET(4 MG) BY MOUTH EVERY  8 HOURS AS NEEDED FOR NAUSEA OR VOMITING 90 tablet 0  . ondansetron (ZOFRAN) 8 MG tablet Take 1 tablet (8 mg total) by mouth 2 (two) times daily as needed. Start on the third day after chemotherapy. (Patient not taking: Reported on 10/16/2019) 30 tablet 1  . potassium chloride SA (KLOR-CON) 20 MEQ tablet Take 1 tablet (20 mEq total) by mouth daily. 10 tablet 0  . prochlorperazine (COMPAZINE) 10 MG tablet Take 1 tablet (10 mg total) by mouth every 6 (six) hours as needed (Nausea or vomiting). (Patient not taking: Reported on 10/16/2019) 30 tablet 1  . vancomycin (VANCOCIN) 125 MG capsule Take 1 capsule (125 mg total) by mouth 4 (four) times daily. 40 capsule 0  . Zinc 25 MG  TABS Take 1 tablet by mouth daily.      No current facility-administered medications for this visit.   Facility-Administered Medications Ordered in Other Visits  Medication Dose Route Frequency Provider Last Rate Last Admin  . heparin lock flush 100 unit/mL  500 Units Intravenous Once Sindy Guadeloupe, MD      . sodium chloride flush (NS) 0.9 % injection 10 mL  10 mL Intravenous PRN Sindy Guadeloupe, MD   10 mL at 11/23/19 1050  . sodium chloride flush (NS) 0.9 % injection 10 mL  10 mL Intravenous Once Sindy Guadeloupe, MD        VITAL SIGNS: There were no vitals taken for this visit. There were no vitals filed for this visit.  Estimated body mass index is 19.78 kg/m as calculated from the following:   Height as of 11/23/19: $RemoveBef'5\' 1"'oWLdAjtLOM$  (1.549 m).   Weight as of 11/23/19: 104 lb 11.2 oz (47.5 kg).  LABS: CBC:    Component Value Date/Time   WBC 11.8 (H) 12/07/2019 1307   HGB 11.4 (L) 12/07/2019 1307   HCT 33.8 (L) 12/07/2019 1307   PLT 286 12/07/2019 1307   MCV 89.4 12/07/2019 1307   NEUTROABS 9.2 (H) 12/07/2019 1307   LYMPHSABS 1.2 12/07/2019 1307   MONOABS 1.1 (H) 12/07/2019 1307   EOSABS 0.2 12/07/2019 1307   BASOSABS 0.1 12/07/2019 1307   Comprehensive Metabolic Panel:    Component Value Date/Time   NA 135 12/07/2019  1307   K 3.8 12/07/2019 1307   CL 101 12/07/2019 1307   CO2 29 12/07/2019 1307   BUN 15 12/07/2019 1307   CREATININE 0.74 12/07/2019 1307   GLUCOSE 133 (H) 12/07/2019 1307   CALCIUM 8.9 12/07/2019 1307   AST 23 12/07/2019 1307   ALT 13 12/07/2019 1307   ALKPHOS 61 12/07/2019 1307   BILITOT 0.7 12/07/2019 1307   PROT 6.5 12/07/2019 1307   ALBUMIN 2.9 (L) 12/07/2019 1307    RADIOGRAPHIC STUDIES: ECHOCARDIOGRAM COMPLETE  Addendum Date: 12/01/2019   RV Lead in place [email or web address] Electronically Amended 12/01/2019, 1:08 PM   Final (Amended)    Result Date: 12/01/2019    ECHOCARDIOGRAM REPORT   Patient Name:   VANNIA POLA Date of Exam: 12/01/2019 Medical Rec #:  [id number]   Height:       61.0 in Accession #:    [id number]  Weight:       104.7 lb Date of Birth:  1926-04-20    BSA:          1.435 m Patient Age:    79 years    BP:           138/62 mmHg Patient Gender: F           HR:           115 bpm. Exam Location:  [facility] Procedure: 2D Echo, Color Doppler and Cardiac Doppler Indications:     v58.11 Chemotherapy evaluation  History:         Patient has no prior history of Echocardiogram examinations.                  CAD, Pacemaker, CKD; Arrythmias:Third degree heart block.  Sonographer:     Charmayne Sheer RDCS (AE) Referring Phys:  [id number] Weston Anna RAO Diagnosing Phys: Yolonda Kida MD  Sonographer Comments: Suboptimal subcostal window. IMPRESSIONS  1. Left ventricular ejection fraction, by estimation, is 65 to 70%. The left ventricle has hyperdynamic function. The left ventricle  has no regional wall motion abnormalities. Left ventricular diastolic parameters are consistent with Grade III diastolic  dysfunction (restrictive).  2. Right ventricular systolic function is moderately reduced. The right ventricular size is moderately enlarged. There is mildly elevated pulmonary artery systolic pressure.  3. The mitral valve is normal in structure. Mild mitral valve regurgitation.  4. The aortic valve is normal  in structure. Aortic valve regurgitation is not visualized. FINDINGS  Left Ventricle: Left ventricular ejection fraction, by estimation, is 65 to 70%. The left ventricle has hyperdynamic function. The left ventricle has no regional wall motion abnormalities. The left ventricular internal cavity size was normal in size. There is no left ventricular hypertrophy. Left ventricular diastolic parameters are consistent with Grade III diastolic dysfunction (restrictive). Right Ventricle: The right ventricular size is moderately enlarged. No increase in right ventricular wall thickness. Right ventricular systolic function is moderately reduced. There is mildly elevated pulmonary artery systolic pressure. Left Atrium: Left atrial size was normal in size. Right Atrium: Right atrial size was normal in size. Pericardium: There is no evidence of pericardial effusion. Mitral Valve: The mitral valve is normal in structure. Mild mitral valve regurgitation. MV peak gradient, 15.7 mmHg. The mean mitral valve gradient is 5.0 mmHg. Tricuspid Valve: The tricuspid valve is normal in structure. Tricuspid valve regurgitation is trivial. Aortic Valve: The aortic valve is normal in structure. Aortic valve regurgitation is not visualized. Aortic valve mean gradient measures 3.0 mmHg. Aortic valve peak gradient measures 4.4 mmHg. Aortic valve area, by VTI measures 3.38 cm. Pulmonic Valve: The pulmonic valve was normal in structure. Pulmonic valve regurgitation is trivial. Aorta: The ascending aorta was not well visualized. IAS/Shunts: No atrial level shunt detected by color flow Doppler. Additional Comments: A pacer wire is visualized.  LEFT VENTRICLE PLAX 2D LVIDd:         2.91 cm     Diastology LVIDs:         2.59 cm     LV e' medial:    4.35 cm/s LV PW:         0.99 cm     LV E/e' medial:  22.5 LV IVS:        0.72 cm     LV e' lateral:   6.09 cm/s LVOT diam:     2.00 cm     LV E/e' lateral: 16.0 LV SV:         54 LV SV Index:   37 LVOT  Area:     3.14 cm  LV Volumes (MOD) LV vol d, MOD A2C: 32.3 ml LV vol d, MOD A4C: 37.1 ml LV vol s, MOD A2C: 14.5 ml LV vol s, MOD A4C: 10.9 ml LV SV MOD A2C:     17.8 ml LV SV MOD A4C:     37.1 ml LV SV MOD BP:      23.8 ml RIGHT VENTRICLE RV Basal diam:  2.24 cm TAPSE (M-mode): 1.9 cm LEFT ATRIUM             Index       RIGHT ATRIUM           Index LA diam:        2.20 cm 1.53 cm/m  RA Area:     11.20 cm LA Vol (A2C):   20.5 ml 14.29 ml/m RA Volume:   24.90 ml  17.36 ml/m LA Vol (A4C):   26.6 ml 18.54 ml/m LA Biplane Vol: 23.4 ml 16.31 ml/m  AORTIC VALVE  PULMONIC VALVE AV Area (Vmax):    3.23 cm    PV Vmax:       0.81 m/s AV Area (Vmean):   3.18 cm    PV Vmean:      48.100 cm/s AV Area (VTI):     3.38 cm    PV VTI:        0.114 m AV Vmax:           105.00 cm/s PV Peak grad:  2.6 mmHg AV Vmean:          79.000 cm/s PV Mean grad:  1.0 mmHg AV VTI:            0.159 m AV Peak Grad:      4.4 mmHg AV Mean Grad:      3.0 mmHg LVOT Vmax:         108.00 cm/s LVOT Vmean:        80.000 cm/s LVOT VTI:          0.171 m LVOT/AV VTI ratio: 1.08  AORTA Ao Root diam: 3.00 cm MITRAL VALVE                TRICUSPID VALVE MV Area (PHT): 6.90 cm     TR Peak grad:   36.7 mmHg MV Peak grad:  15.7 mmHg    TR Vmax:        303.00 cm/s MV Mean grad:  5.0 mmHg MV Vmax:       1.98 m/s     SHUNTS MV Vmean:      100.0 cm/s   Systemic VTI:  0.17 m MV Decel Time: 110 msec     Systemic Diam: 2.00 cm MV E velocity: 97.70 cm/s MV A velocity: 191.00 cm/s MV E/A ratio:  0.51 Dwayne D Callwood MD Electronically signed by Yolonda Kida MD Signature Date/Time: 12/01/2019/1:07:25 PM    Final (Amended)     PERFORMANCE STATUS (ECOG) : 1 - Symptomatic but completely ambulatory  Review of Systems Unless otherwise noted, a complete review of systems is negative.  Physical Exam General: NAD, frail appearing, thin Pulmonary: unlabored Extremities: no edema Skin: no rashes Neurological: Weakness but otherwise  nonfocal  IMPRESSION: I met with patient and daughter today in clinic.  Patient seen following visit with Dr. Janese Banks.  Patient has decided to forego future work-up and treatment and instead focus on comfort and hospice care.  Patient and daughter verbalize same to me.  I called in a referral for hospice to [facility].    I completed a MOST form today. The patient and family outlined their wishes for the following treatment decisions:  Cardiopulmonary Resuscitation: Do Not Attempt Resuscitation (DNR/No CPR)  Medical Interventions: Limited Additional Interventions: Use medical treatment, IV fluids and cardiac monitoring as indicated, DO NOT USE intubation or mechanical ventilation. May consider use of less invasive airway support such as BiPAP or CPAP. Also provide comfort measures. Transfer to the hospital if indicated. Avoid intensive care.   Antibiotics: Determine use of limitation of antibiotics when infection occurs  IV Fluids: IV fluids for a defined trial period  Feeding Tube: No feeding tube     PLAN: -Best supportive care -DNR/DNI -MOST form completed -Referral to hospice -RTC as needed   Patient expressed understanding and was in agreement with this plan. She also understands that She can call the clinic at any time with any questions, concerns, or complaints.     Time Total: 25 minutes  Visit consisted of counseling and  education dealing with the complex and emotionally intense issues of symptom management and palliative care in the setting of serious and potentially life-threatening illness.Greater than 50%  of this time was spent counseling and coordinating care related to the above assessment and plan.  Signed by: Altha Harm, PhD, NP-C [phone] (Work Cell)

## 2019-12-08 DIAGNOSIS — C786 Secondary malignant neoplasm of retroperitoneum and peritoneum: Secondary | ICD-10-CM | POA: Diagnosis not present

## 2019-12-08 DIAGNOSIS — K5730 Diverticulosis of large intestine without perforation or abscess without bleeding: Secondary | ICD-10-CM | POA: Diagnosis not present

## 2019-12-08 DIAGNOSIS — I2510 Atherosclerotic heart disease of native coronary artery without angina pectoris: Secondary | ICD-10-CM | POA: Diagnosis not present

## 2019-12-08 DIAGNOSIS — N189 Chronic kidney disease, unspecified: Secondary | ICD-10-CM | POA: Diagnosis not present

## 2019-12-08 DIAGNOSIS — N1330 Unspecified hydronephrosis: Secondary | ICD-10-CM | POA: Diagnosis not present

## 2019-12-08 DIAGNOSIS — C787 Secondary malignant neoplasm of liver and intrahepatic bile duct: Secondary | ICD-10-CM | POA: Diagnosis not present

## 2019-12-08 DIAGNOSIS — I10 Essential (primary) hypertension: Secondary | ICD-10-CM | POA: Diagnosis not present

## 2019-12-08 DIAGNOSIS — K222 Esophageal obstruction: Secondary | ICD-10-CM | POA: Diagnosis not present

## 2019-12-08 DIAGNOSIS — I442 Atrioventricular block, complete: Secondary | ICD-10-CM | POA: Diagnosis not present

## 2019-12-08 DIAGNOSIS — C563 Malignant neoplasm of bilateral ovaries: Secondary | ICD-10-CM | POA: Diagnosis not present

## 2019-12-08 NOTE — Progress Notes (Signed)
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 She underwent neoadjuvant chemotherapy followed by surgery with Dr. Beverly Milch followed by adjuvant chemotherapy. Genetic testing was negative. She then established care at [facility] in 2017. More recently patient was found to have a consistently increasing Ca1 25 level from 42.6 in December 20 18-781 in February 2020. Most recent Ca1 25 was 02/21/2005 on 07/06/2018. Scans were however negative for any recurrence. Patient had CT abdomen and pelvis with contrast done on 07/06/2018 which showed severe left hydro ureteral nephrosis. Sigmoid wall thickening differential includes carcinomatosis and colitis. She did had a repeat scan on 07/09/2018 which showed interval increase in ascites mostly within the pelvis with associated enhancement of the peritoneum suggesting carcinomatosis. Redemonstration of sigmoid wall thickening which may represent colitis versus carcinomatosis. Delayed left nephrogram with severe left hydro-ureteronephrosis similar to prior.  Given findings of colitis as well as ongoing symptoms of melena and diarrhea patient underwent EGD as well as flexible sigmoidoscopy which showed Schatzki's ring and diverticulosis but no evidence of tumor. Her symptoms are possibly secondary to carcinomatosis. She also had other stool studies done including C. difficile and stool  cultures which were negative.Patient did undergo a diagnostic tap of her ascites on 07/09/2018 which was consistent with adenocarcinoma  Patient started cycle 1 with single agent carboplatin on 07/28/2018 and finished 18cycles of weekly carbotaxol on 11/28/2018  Scans in June 2021 showed rising tumor markers and liver metastases. Plan is to switch her to second line Doxil and addcarboplatin if tolerated.Foundation 1 liquid biopsy did not show any evidence of actionable mutations.PD-L1 0%   Interval history-patient has declined significantly in the last 2 to 3 weeks.  She first had C. difficile and when she developed from back she had another bout of UTI.  She is here with her daughter today  ECOG PS- 3 Pain scale- 0   Review of systems- Review of Systems  Constitutional: Positive for malaise/fatigue. Negative for chills, fever and weight loss.  HENT: Negative for congestion, ear discharge and nosebleeds.   Eyes: Negative for blurred vision.  Respiratory: Negative for cough, hemoptysis, sputum production, shortness of breath and wheezing.   Cardiovascular: Negative for chest pain, palpitations, orthopnea and claudication.  Gastrointestinal: Negative for abdominal pain, blood in stool, constipation, diarrhea, heartburn, melena, nausea and vomiting.  Genitourinary: Negative for dysuria, flank pain, frequency, hematuria and urgency.  Musculoskeletal: Negative for back pain, joint pain and myalgias.  Skin: Negative for rash.  Neurological: Negative for dizziness, tingling, focal weakness, seizures, weakness and headaches.  Endo/Heme/Allergies: Does not bruise/bleed easily.  Psychiatric/Behavioral: Negative for depression and suicidal ideas. The patient does not have insomnia.        Allergies  Allergen Reactions  . Penicillins Anaphylaxis    Has patient had a PCN reaction causing immediate rash, facial/tongue/throat swelling, SOB or lightheadedness with hypotension: Yes Has patient  had a PCN reaction causing severe rash involving mucus membranes or skin necrosis: No Has patient had  a PCN reaction that required hospitalization No Has patient had a PCN reaction occurring within the last 10 years: No If all of the above answers are "NO", then may proceed with Cephalosporin use.     Past Medical History:  Diagnosis Date  . CAD (coronary artery disease)   . Chronic kidney disease    urinary incontinence  . Hyperthyroidism   . Hypothyroidism (acquired)   . Ovarian cancer ([place]) 2014  . Shingles   . Third degree AV block ([place]) 10/09/2015  . UTI (urinary tract infection)      Past Surgical History:  Procedure Laterality Date  . ABDOMINAL HYSTERECTOMY    . BLADDER REPAIR    . CORONARY ANGIOPLASTY WITH STENT PLACEMENT  2001   Dr. Isaiah Blakes in [place]  . CYSTOURETHROSCOPY, WITH INSERTION OF INDWELLING URETERAL STENT (EG,GIBBONS OR DOUBLE-J TYPE) (Left Ureter Left   . PACEMAKER INSERTION N/A 10/11/2015   Procedure: INSERTION PACEMAKER;  Surgeon: [name], MD;  Location: [facility] ORS;  Service: Cardiovascular;  Laterality: N/A;  . PARTIAL HYSTERECTOMY    . PORTA CATH INSERTION N/A 07/24/2018   Procedure: PORTA CATH INSERTION;  Surgeon: Algernon Huxley, MD;  Location: [facility];  Service: Cardiovascular;  Laterality: N/A;    Social History   Socioeconomic History  . Marital status: Widowed    Spouse name: Not on file  . Number of children: Not on file  . Years of education: Not on file  . Highest education level: Not on file  Occupational History  . Not on file  Tobacco Use  . Smoking status: Never Smoker  . Smokeless tobacco: Never Used  Vaping Use  . Vaping Use: Never used  Substance and Sexual Activity  . Alcohol use: No  . Drug use: No  . Sexual activity: Never  Other Topics Concern  . 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 Difficulty of Paying Living Expenses: Not on file  Food Insecurity:   . Worried About Charity fundraiser in the Last Year: Not on file  . Ran Out of Food in the Last Year: Not on file  Transportation Needs:   . Lack of Transportation (Medical): Not on file  . Lack of Transportation (Non-Medical): Not on file  Physical Activity:   . Days of Exercise per Week: Not on file  . Minutes of Exercise per Session: Not on file  Stress:   . Feeling of Stress : Not on file  Social Connections:   . Frequency of Communication with Friends and Family: Not on file  . Frequency of Social Gatherings with Friends and Family: Not on file  . Attends Religious Services: Not on file  . Active Member of Clubs or Organizations: Not on file  . Attends Archivist Meetings: Not on file  . Marital Status: Not on file  Intimate Partner Violence:   . Fear of Current or Ex-Partner: Not on file  . Emotionally Abused: Not on file  . Physically Abused: Not on file  . Sexually Abused: Not on file    Family History  Problem Relation Age of Onset  . Diabetes Brother      Current Outpatient Medications:  .  AMERICAN GINSENG PO, Take 2 tablets by mouth daily. 500 mg tab and she takes 2 a day, Disp: , Rfl:  .  aspirin EC 81 MG tablet, Take 81 mg by mouth daily., Disp: , Rfl:  .  atorvastatin (LIPITOR) 10 MG tablet, Take 10 mg by mouth at bedtime. , Disp: , Rfl:  .  Bioflavonoid Products (ESTER C PO), Take 500 mg by mouth daily. , Disp: , Rfl:  .  ciprofloxacin (CIPRO) 250 MG tablet, Take 1 tablet (250 mg total) by mouth 2 (two) times daily for 5 days., Disp: 10 tablet, Rfl: 0 .  Coenzyme Q10 (COQ10) 100 MG CAPS, Take 100 mg by mouth daily. , Disp: , Rfl:  .  D-MANNOSE PO, Take 1 tablet by mouth daily., Disp: , Rfl:  .  Digestive Enzymes (DIGESTIVE ENZYME PO), Take 1 Dose by mouth 3 (three) times daily before meals., Disp: , Rfl:  .  Glucosamine-MSM-Hyaluronic Acd (JOINT  HEALTH PO), Take 1 tablet by mouth 2 (two) times a day. , Disp: , Rfl:  .  hydrocortisone 2.5 % cream, Apply 1 application topically daily as needed. , Disp: , Rfl:  .  levothyroxine (SYNTHROID) 100 MCG tablet, Take 100 mcg by mouth daily before breakfast. , Disp: , Rfl:  .  loratadine (CLARITIN) 10 MG tablet, Take 10 mg by mouth as directed. Around the days of neulasta, Disp: , Rfl:  .  mirabegron ER (MYRBETRIQ) 50 MG TB24 tablet, Take 50 mg by mouth daily. , Disp: , Rfl:  .  OLANZapine (ZYPREXA) 10 MG tablet, TAKE 1 TABLET(10 MG) BY MOUTH AT BEDTIME, Disp: 30 tablet, Rfl: 0 .  ondansetron (ZOFRAN) 4 MG tablet, TAKE 1 TABLET(4 MG) BY MOUTH EVERY 8 HOURS AS NEEDED FOR NAUSEA OR VOMITING, Disp: 90 tablet, Rfl: 0 .  potassium chloride SA (KLOR-CON) 20 MEQ tablet, Take 1 tablet (20 mEq total) by mouth daily., Disp: 10 tablet, Rfl: 0 .  vancomycin (VANCOCIN) 125 MG capsule, Take 1 capsule (125 mg total) by mouth 4 (four) times daily., Disp: 40 capsule, Rfl: 0 .  Zinc 25 MG TABS, Take 1 tablet by mouth daily. , Disp: , Rfl:  .  Cholecalciferol (VITAMIN D3) 1000 units CAPS, Take 1,000 Units by mouth daily.  (Patient not taking: Reported on 11/23/2019), Disp: , Rfl:  .  Cyanocobalamin (VITAMIN B-12) 1000 MCG/15ML LIQD, Take 1 mL by mouth daily. (Patient not taking: Reported on 11/23/2019), Disp: , Rfl:  .  dexamethasone (DECADRON) 4 MG tablet, Take 2 tablets by mouth once a day for 3 days after chemo. Take with food. (Patient not taking: Reported on 11/23/2019), Disp: 30 tablet, Rfl: 1 .  ondansetron (ZOFRAN) 8 MG tablet, Take 1 tablet (8 mg total) by mouth 2 (two) times daily as needed. Start on the third day after chemotherapy. (Patient not taking: Reported on 10/16/2019), Disp: 30 tablet, Rfl: 1 .  prochlorperazine (COMPAZINE) 10 MG tablet, Take 1 tablet (10 mg total) by mouth every 6 (six) hours as needed (Nausea or vomiting). (Patient not taking: Reported on 10/16/2019), Disp: 30 tablet, Rfl: 1 No current  facility-administered medications for this visit.  Facility-Administered Medications Ordered in Other Visits:  .  sodium chloride flush (NS) 0.9 % injection 10 mL, 10 mL, Intravenous, PRN, Sindy Guadeloupe, MD, 10 mL at 11/23/19 1050  Physical exam:  Physical Exam Constitutional:      Comments: Thin elderly cachectic female in a wheelchair.  Appears fatigued  Cardiovascular:     Rate and Rhythm: Normal rate and regular rhythm.     Heart sounds: Normal heart sounds.  Pulmonary:     Effort: Pulmonary  effort is normal.     Breath sounds: Normal breath sounds.  Abdominal:     General: Bowel sounds are normal.     Palpations: Abdomen is soft.  Skin:    General: Skin is warm and dry.  Neurological:     Mental Status: She is alert and oriented to person, place, and time.      CMP Latest Ref Rng & Units 12/07/2019  Glucose 70 - 99 mg/dL 133(H)  BUN 8 - 23 mg/dL 15  Creatinine 0.44 - 1.00 mg/dL 0.74  Sodium 135 - 145 mmol/L 135  Potassium 3.5 - 5.1 mmol/L 3.8  Chloride 98 - 111 mmol/L 101  CO2 22 - 32 mmol/L 29  Calcium 8.9 - 10.3 mg/dL 8.9  Total Protein 6.5 - 8.1 g/dL 6.5  Total Bilirubin 0.3 - 1.2 mg/dL 0.7  Alkaline Phos 38 - 126 U/L 61  AST 15 - 41 U/L 23  ALT 0 - 44 U/L 13   CBC Latest Ref Rng & Units 12/07/2019  WBC 4.0 - 10.5 K/uL 11.8(H)  Hemoglobin 12.0 - 15.0 g/dL 11.4(L)  Hematocrit 36 - 46 % 33.8(L)  Platelets 150 - 400 K/uL 286    No images are attached to the encounter.  ECHOCARDIOGRAM COMPLETE  Addendum Date: 12/01/2019   RV Lead in place [email or web address] Electronically Amended 12/01/2019, 1:08 PM   Final (Amended)    Result Date: 12/01/2019    ECHOCARDIOGRAM REPORT   Patient Name:   Natalie Stone Date of Exam: 12/01/2019 Medical Rec #:  [id number]   Height:       61.0 in Accession #:    [id number]  Weight:       104.7 lb Date of Birth:  Aug 06, 1926    BSA:          1.435 m Patient Age:    75 years    BP:           138/62 mmHg Patient Gender: F           HR:            115 bpm. Exam Location:  [facility] Procedure: 2D Echo, Color Doppler and Cardiac Doppler Indications:     v58.11 Chemotherapy evaluation  History:         Patient has no prior history of Echocardiogram examinations.                  CAD, Pacemaker, CKD; Arrythmias:Third degree heart block.  Sonographer:     Charmayne Sheer RDCS (AE) Referring Phys:  [id number] Weston Anna Sabena Winner Diagnosing Phys: Yolonda Kida MD  Sonographer Comments: Suboptimal subcostal window. IMPRESSIONS  1. Left ventricular ejection fraction, by estimation, is 65 to 70%. The left ventricle has hyperdynamic function. The left ventricle has no regional wall motion abnormalities. Left ventricular diastolic parameters are consistent with Grade III diastolic  dysfunction (restrictive).  2. Right ventricular systolic function is moderately reduced. The right ventricular size is moderately enlarged. There is mildly elevated pulmonary artery systolic pressure.  3. The mitral valve is normal in structure. Mild mitral valve regurgitation.  4. The aortic valve is normal in structure. Aortic valve regurgitation is not visualized. FINDINGS  Left Ventricle: Left ventricular ejection fraction, by estimation, is 65 to 70%. The left ventricle has hyperdynamic function. The left ventricle has no regional wall motion abnormalities. The left ventricular internal cavity size was normal in size. There is no left ventricular hypertrophy. Left ventricular diastolic parameters are consistent with Grade III diastolic  dysfunction (restrictive). Right Ventricle: The right ventricular size is moderately enlarged. No increase in right ventricular wall thickness. Right ventricular systolic function is moderately reduced. There is mildly elevated pulmonary artery systolic pressure. Left Atrium: Left atrial size was normal in size. Right Atrium: Right atrial size was normal in size. Pericardium: There is no evidence of pericardial effusion. Mitral Valve: The mitral valve is normal in  structure. Mild mitral valve regurgitation. MV peak gradient, 15.7 mmHg. The mean mitral valve gradient is 5.0 mmHg. Tricuspid Valve: The tricuspid valve is normal in structure. Tricuspid valve regurgitation is trivial. Aortic Valve: The aortic valve is normal in structure. Aortic valve regurgitation is not visualized. Aortic valve mean gradient measures 3.0 mmHg. Aortic valve peak gradient measures 4.4 mmHg. Aortic valve area, by VTI measures 3.38 cm. Pulmonic Valve: The pulmonic valve was normal in structure. Pulmonic valve regurgitation is trivial. Aorta: The ascending aorta was not well visualized. IAS/Shunts: No atrial level shunt detected by color flow Doppler. Additional Comments: A pacer wire is visualized.  LEFT VENTRICLE PLAX 2D LVIDd:         2.91 cm     Diastology LVIDs:         2.59 cm     LV e' medial:    4.35 cm/s LV PW:         0.99 cm     LV E/e' medial:  22.5 LV IVS:        0.72 cm     LV e' lateral:   6.09 cm/s LVOT diam:     2.00 cm     LV E/e' lateral: 16.0 LV SV:         54 LV SV Index:   37 LVOT Area:     3.14 cm  LV Volumes (MOD) LV vol d, MOD A2C: 32.3 ml LV vol d, MOD A4C: 37.1 ml LV vol s, MOD A2C: 14.5 ml LV vol s, MOD A4C: 10.9 ml LV SV MOD A2C:     17.8 ml LV SV MOD A4C:     37.1 ml LV SV MOD BP:      23.8 ml RIGHT VENTRICLE RV Basal diam:  2.24 cm TAPSE (M-mode): 1.9 cm LEFT ATRIUM             Index       RIGHT ATRIUM           Index LA diam:        2.20 cm 1.53 cm/m  RA Area:     11.20 cm LA Vol (A2C):   20.5 ml 14.29 ml/m RA Volume:   24.90 ml  17.36 ml/m LA Vol (A4C):   26.6 ml 18.54 ml/m LA Biplane Vol: 23.4 ml 16.31 ml/m  AORTIC VALVE                   PULMONIC VALVE AV Area (Vmax):    3.23 cm    PV Vmax:       0.81 m/s AV Area (Vmean):   3.18 cm    PV Vmean:      48.100 cm/s AV Area (VTI):     3.38 cm    PV VTI:        0.114 m AV Vmax:           105.00 cm/s PV Peak grad:  2.6 mmHg AV Vmean:          79.000 cm/s PV Mean grad:  1.0 mmHg AV VTI:  0.159 m AV Peak  Grad:      4.4 mmHg AV Mean Grad:      3.0 mmHg LVOT Vmax:         108.00 cm/s LVOT Vmean:        80.000 cm/s LVOT VTI:          0.171 m LVOT/AV VTI ratio: 1.08  AORTA Ao Root diam: 3.00 cm MITRAL VALVE                TRICUSPID VALVE MV Area (PHT): 6.90 cm     TR Peak grad:   36.7 mmHg MV Peak grad:  15.7 mmHg    TR Vmax:        303.00 cm/s MV Mean grad:  5.0 mmHg MV Vmax:       1.98 m/s     SHUNTS MV Vmean:      100.0 cm/s   Systemic VTI:  0.17 m MV Decel Time: 110 msec     Systemic Diam: 2.00 cm MV E velocity: 97.70 cm/s MV A velocity: 191.00 cm/s MV E/A ratio:  0.51 Dwayne D Callwood MD Electronically signed by Yolonda Kida MD Signature Date/Time: 12/01/2019/1:07:25 PM    Final (Amended)      Assessment and plan- Patient is a 86 y.o. female  with recurrent ovarian carcinomawith peritoneal and liver metastases.   She is s/p 2 cycles of Doxil and here to discuss further goals of care  Patient last received Doxil about 6 weeks ago.  Since then she has had C. difficile followed by UTI and her performance status has declined significantly.  Also her CA-125 is trending up.  Given her age and frailty as well as recurrent UTIs and C. difficile it would be difficult to offer palliative chemotherapy without untoward side effects which will further worsen her quality of life.  We discussed that the most reasonable option at this time would be proceeding with home hospice.  Patient lives with her daughter who is her primary caregiver and they both understand her overall poor prognosis and the focus on maintaining her quality of life over seeking active treatment at this time.  She is a DNR/DNI.  We will make arrangements for home hospice at this time and she will be seen by Altha Harm from palliative care today as well   Visit Diagnosis 1. Goals of care, counseling/discussion   2. Malignant neoplasm of ovary, unspecified laterality ([place])      Dr. Randa Evens, MD, MPH [facility] at [facility] [phone] 12/08/2019 12/08/2019 8:39 AM

## 2019-12-10 DIAGNOSIS — K222 Esophageal obstruction: Secondary | ICD-10-CM | POA: Diagnosis not present

## 2019-12-10 DIAGNOSIS — C787 Secondary malignant neoplasm of liver and intrahepatic bile duct: Secondary | ICD-10-CM | POA: Diagnosis not present

## 2019-12-10 DIAGNOSIS — I442 Atrioventricular block, complete: Secondary | ICD-10-CM | POA: Diagnosis not present

## 2019-12-10 DIAGNOSIS — K5730 Diverticulosis of large intestine without perforation or abscess without bleeding: Secondary | ICD-10-CM | POA: Diagnosis not present

## 2019-12-10 DIAGNOSIS — N189 Chronic kidney disease, unspecified: Secondary | ICD-10-CM | POA: Diagnosis not present

## 2019-12-10 DIAGNOSIS — I2510 Atherosclerotic heart disease of native coronary artery without angina pectoris: Secondary | ICD-10-CM | POA: Diagnosis not present

## 2019-12-10 DIAGNOSIS — N1330 Unspecified hydronephrosis: Secondary | ICD-10-CM | POA: Diagnosis not present

## 2019-12-10 DIAGNOSIS — C563 Malignant neoplasm of bilateral ovaries: Secondary | ICD-10-CM | POA: Diagnosis not present

## 2019-12-10 DIAGNOSIS — C786 Secondary malignant neoplasm of retroperitoneum and peritoneum: Secondary | ICD-10-CM | POA: Diagnosis not present

## 2019-12-14 ENCOUNTER — Telehealth: Payer: Self-pay | Admitting: *Deleted

## 2019-12-14 MED ORDER — MORPHINE SULFATE (CONCENTRATE) 10 MG /0.5 ML PO SOLN: 5.0000 mg | ORAL | 0 refills | Status: AC | PRN

## 2019-12-14 MED ORDER — LORAZEPAM 0.5 MG PO TABS: 0.5000 mg | ORAL_TABLET | Freq: Three times a day (TID) | ORAL | 0 refills | Status: AC

## 2019-12-14 NOTE — Telephone Encounter (Signed)
I spoke with patient's hospice nurse.  Patient was ambulatory last week but is now bedbound.  Oral intake is minimal.  Nurse feels like patient is rapidly approaching end-of-life.  She requested comfort meds.  I okayed a verbal order to implement hospice protocol.  Will send Rx for morphine elixir and lorazepam.

## 2019-12-14 NOTE — Telephone Encounter (Signed)
Natalie Stone can you please look into this?

## 2019-12-14 NOTE — Telephone Encounter (Signed)
Stacey with Hospice called reporting that patient has had a raid decline and that she is in need of comfort medications in the home. She is requesting prescriptions for MS (Roxanol), Levsin, Ativan, and Tylenol suppositories to be sent to pharmacy for patient.

## 2019-12-21 DEATH — deceased

## 2020-01-06 ENCOUNTER — Ambulatory Visit: Payer: Medicare HMO

## 2020-04-14 IMAGING — CT CT CHEST WITH CONTRAST
2 of 5 series · 11 of 36 positions shown, 13 images · IV contrast (omnipaque)
Comparison: CT chest 10/30/2017 and CT AP 12/23/2017
COMPARISON: CT chest 10/30/2017 and CT AP 12/23/2017

Addendum:
CLINICAL DATA: Ovarian cancer.  Restaging

EXAM:
CT CHEST, ABDOMEN, AND PELVIS WITH CONTRAST
TECHNIQUE: Multidetector CT imaging of the chest, abdomen and pelvis was
performed following the standard protocol during bolus
administration of intravenous contrast.
CONTRAST:  100mL OMNIPAQUE IOHEXOL 300 MG/ML  SOLN

[Series 2: cap with · axial · 0.72mm/px · z∈[-853,-373]mm · 8 of 122 slices shown, 10 images]
[im 13/122  mediastinal]
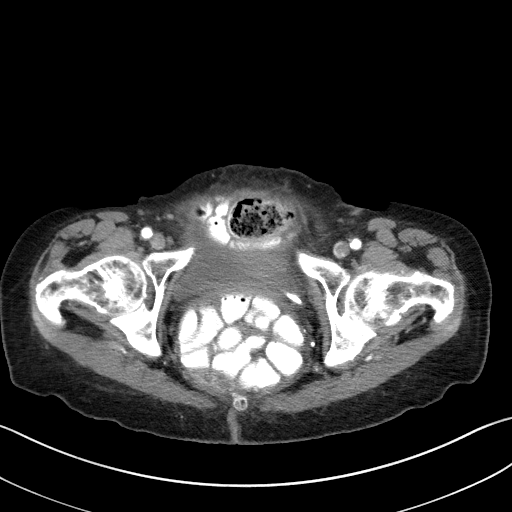
[im 13/122  lung]
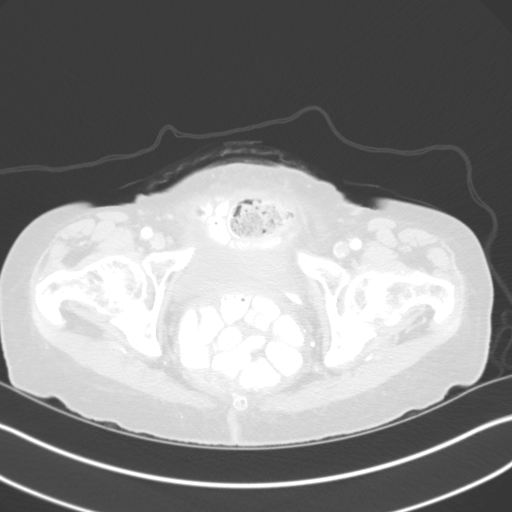
[im 25/122  lung]
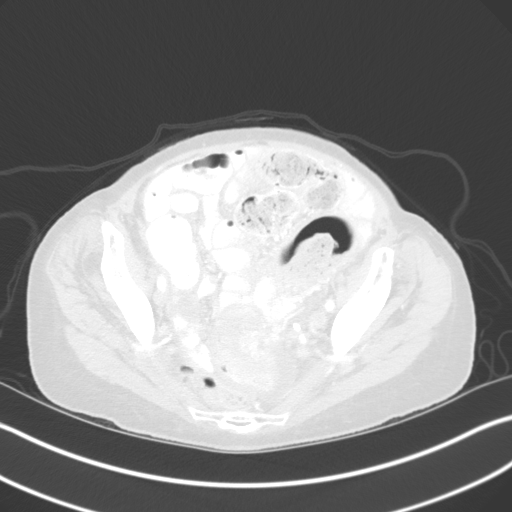
[im 37/122  lung]
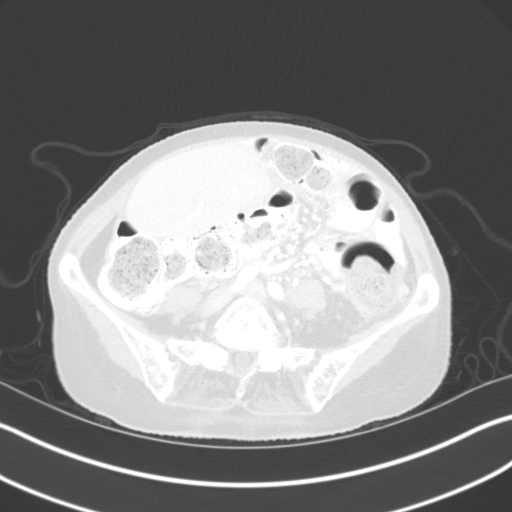
[im 49/122  lung]
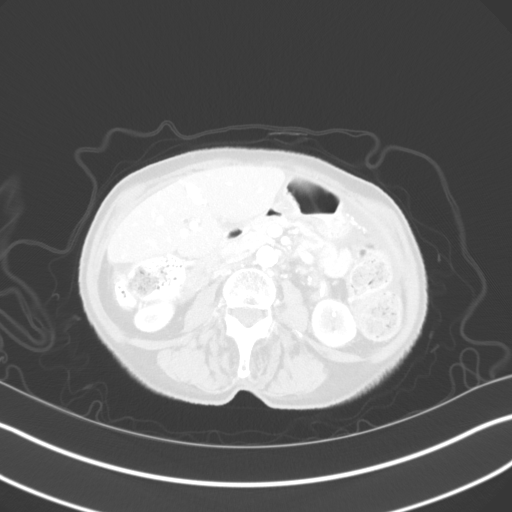
[im 73/122  mediastinal]
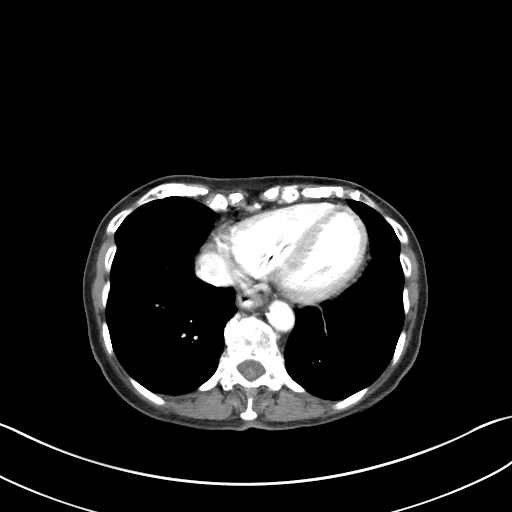
[im 73/122  lung]
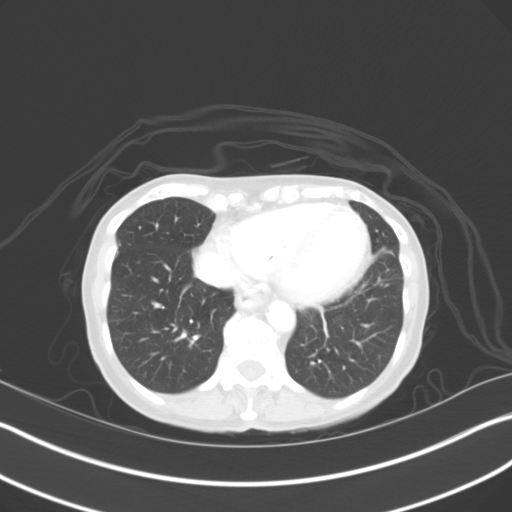
[im 85/122  lung]
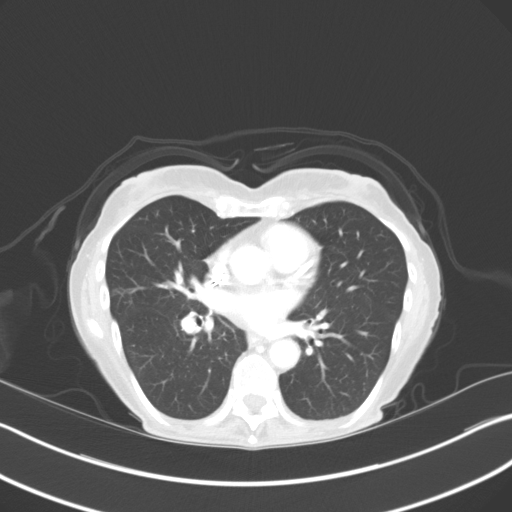
[im 97/122  lung]
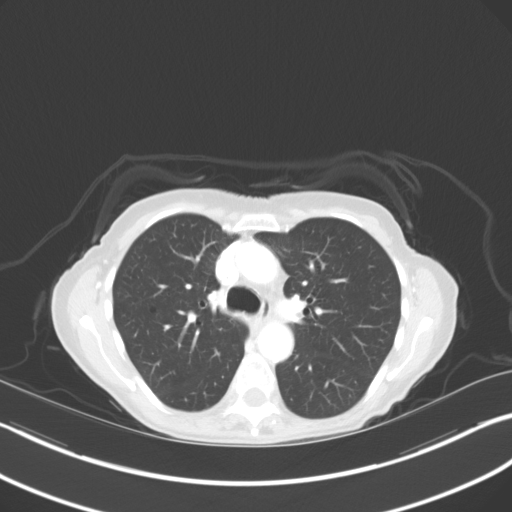
[im 109/122  lung]
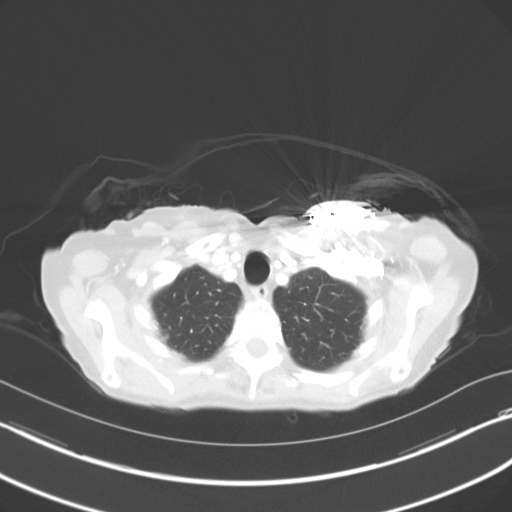

[Series 5: coronals · coronal · 0.66mm/px · 3 of 122 slices shown]
[im 25/122  lung]
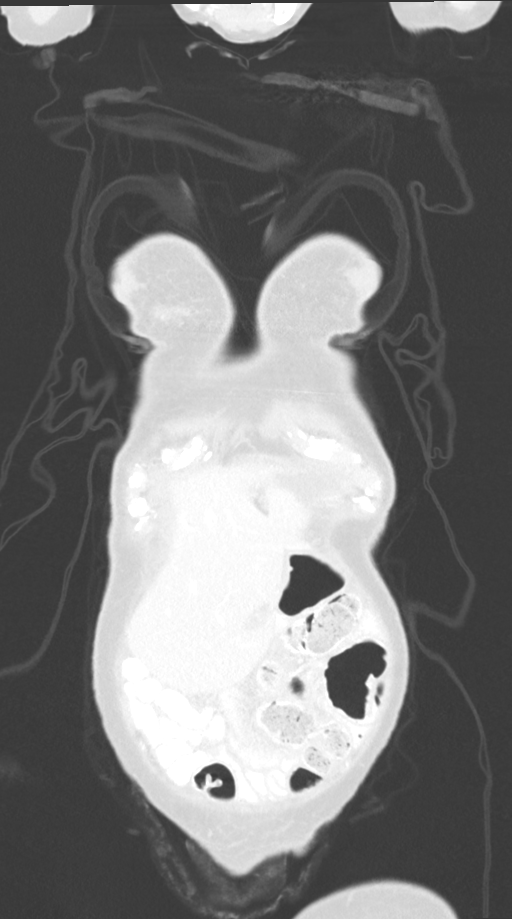
[im 49/122  lung]
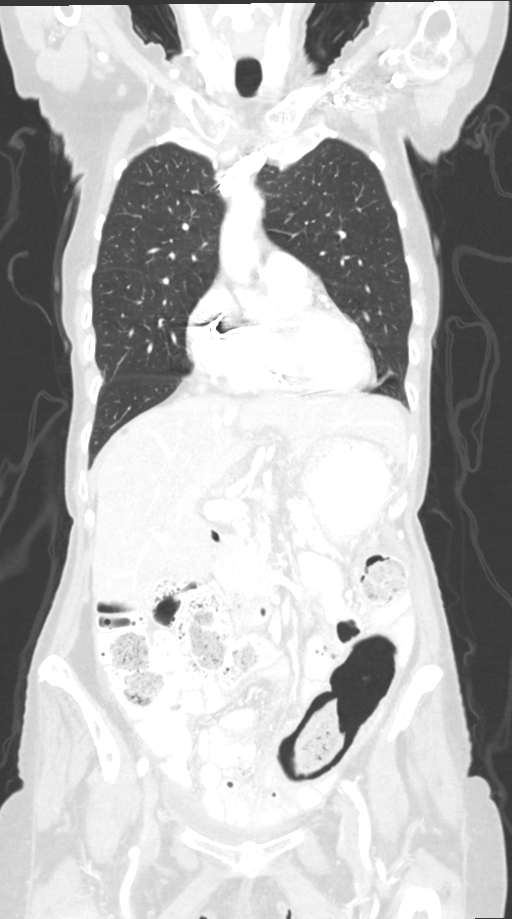
[im 73/122  lung]
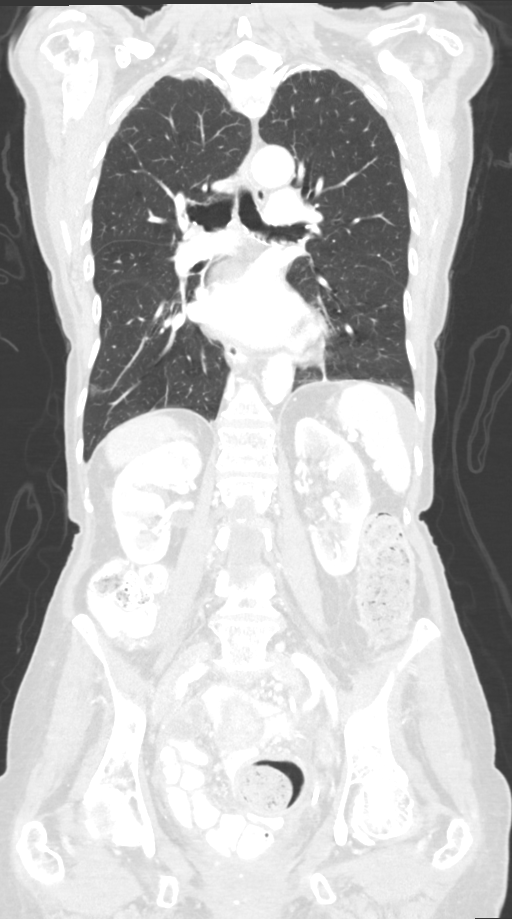

[11 of 36 positions shown; findings below may reference images not displayed]

FINDINGS: CT CHEST FINDINGS

Cardiovascular: Normal heart size. Aortic atherosclerosis. Coronary
artery calcifications. No pericardial effusion.

Mediastinum/Nodes: Normal appearance of the thyroid gland. The
trachea appears patent and is midline. Normal appearance of the
esophagus. No axillary or supraclavicular adenopathy. No mediastinal
or hilar adenopathy.

Lungs/Pleura: No pleural effusion. New cluster of tree-in-bud
nodules identified within the anterolateral right lower lobe, likely
postinflammatory/infectious, image 80/4. 2 mm right lower lobe lung
nodule is unchanged, image 130/3. Right middle lobe perifissural
nodule is unchanged measuring 3 mm, image 134/4. No suspicious
nodule or mass.

Musculoskeletal: No chest wall mass or suspicious bone lesions
identified.

CT ABDOMEN PELVIS FINDINGS

Hepatobiliary: No suspicious liver abnormality. The gallbladder
appears within normal limits. Upper limits of normal common bile
duct measuring 6 mm.

Pancreas: Unremarkable. No pancreatic ductal dilatation or
surrounding inflammatory changes.

Spleen: Normal in size without focal abnormality.

Adrenals/Urinary Tract: Normal appearance of the adrenal glands.
Interval placement of left-sided nephroureteral stent. No
hydronephrosis or mass identified bilaterally. Urinary bladder
appears normal.

Stomach/Bowel: Stomach is within normal limits. No dilated loops of
small bowel. There is a large stool burden identified throughout the
colon

Vascular/Lymphatic: Aortic atherosclerosis. No aneurysm. No
retroperitoneal, mesenteric or pelvic adenopathy.

Reproductive: Status post hysterectomy. Not confidently identified
on the previous exam is a solid and cystic structure in the right
adnexal region measuring 3.1 by 2.4 cm, image 97/2. No left adnexal
mass.

Other: Interval development of loculated ascites within the abdomen
and pelvis. Suspicious for recurrent peritoneal disease. Enhancing
peritoneal nodule within the left lower quadrant of the abdomen is
new measuring 7 mm.

Musculoskeletal: No suspicious aggressive lytic or sclerotic bone
lesions.
IMPRESSION: 1. Interval development of loculated ascites within the abdomen and
pelvis concerning for recurrent peritoneal disease. New solid and
cystic lesion in the right adnexal region is noted, concerning for
tumor deposit. Additionally, there is a small nodule along the left
pericolic gutter concerning for peritoneal disease.
2. No findings to suggest metastatic disease to the chest.

ADDENDUM:
Images from [REDACTED] [HOSPITAL] have been subsequently
imported for comparison. The exam is dated 07/09/2018.

-Similar volume of loculated ascites and peritoneal enhancement
within the abdomen and pelvis.

-the cystic lesion within the right adnexa measuring 3.1 x 2.4 cm on
the current exam was present on 07/09/2018 measuring 2.6 x 2.0 cm.

-Enhancing peritoneal nodule within the left lower quadrant of the
abdomen measuring 7 mm, on 07/09/2018 this measured 6 mm.

-Similar appearance of wall thickening of the sigmoid colon as
described on 07/09/2018 concerning for serosal tumor involvement.

-interval placement of left-sided stent with resolution of
left-sided hydronephrosis.
IMPRESSION: 1. Similar appearance of loculated ascites, peritoneal enhancement,
serosal involvement of the sigmoid colon and small peritoneal nodule
in the left lower quadrant of the abdomen. Findings are compatible
with stable peritoneal carcinomatosis.
2. Resolution of left hydronephrosis status post placement of
ureteral stent

*** End of Addendum ***
FINDINGS: CT CHEST FINDINGS

Cardiovascular: Normal heart size. Aortic atherosclerosis. Coronary
artery calcifications. No pericardial effusion.

Mediastinum/Nodes: Normal appearance of the thyroid gland. The
trachea appears patent and is midline. Normal appearance of the
esophagus. No axillary or supraclavicular adenopathy. No mediastinal
or hilar adenopathy.

Lungs/Pleura: No pleural effusion. New cluster of tree-in-bud
nodules identified within the anterolateral right lower lobe, likely
postinflammatory/infectious, image 80/4. 2 mm right lower lobe lung
nodule is unchanged, image 130/3. Right middle lobe perifissural
nodule is unchanged measuring 3 mm, image 134/4. No suspicious
nodule or mass.

Musculoskeletal: No chest wall mass or suspicious bone lesions
identified.

CT ABDOMEN PELVIS FINDINGS

Hepatobiliary: No suspicious liver abnormality. The gallbladder
appears within normal limits. Upper limits of normal common bile
duct measuring 6 mm.

Pancreas: Unremarkable. No pancreatic ductal dilatation or
surrounding inflammatory changes.

Spleen: Normal in size without focal abnormality.

Adrenals/Urinary Tract: Normal appearance of the adrenal glands.
Interval placement of left-sided nephroureteral stent. No
hydronephrosis or mass identified bilaterally. Urinary bladder
appears normal.

Stomach/Bowel: Stomach is within normal limits. No dilated loops of
small bowel. There is a large stool burden identified throughout the
colon

Vascular/Lymphatic: Aortic atherosclerosis. No aneurysm. No
retroperitoneal, mesenteric or pelvic adenopathy.

Reproductive: Status post hysterectomy. Not confidently identified
on the previous exam is a solid and cystic structure in the right
adnexal region measuring 3.1 by 2.4 cm, image 97/2. No left adnexal
mass.

Other: Interval development of loculated ascites within the abdomen
and pelvis. Suspicious for recurrent peritoneal disease. Enhancing
peritoneal nodule within the left lower quadrant of the abdomen is
new measuring 7 mm.

Musculoskeletal: No suspicious aggressive lytic or sclerotic bone
lesions.
IMPRESSION: 1. Interval development of loculated ascites within the abdomen and
pelvis concerning for recurrent peritoneal disease. New solid and
cystic lesion in the right adnexal region is noted, concerning for
tumor deposit. Additionally, there is a small nodule along the left
pericolic gutter concerning for peritoneal disease.
2. No findings to suggest metastatic disease to the chest.

## 2020-10-12 IMAGING — CT CT CHEST W/ CM
1 series · 1 of 1 positions shown · IV contrast (omnipaque)
Comparison: 11/11/2018, 09/04/2018

CLINICAL DATA: Restaging ovarian cancer

EXAM:
CT CHEST, ABDOMEN, AND PELVIS WITH CONTRAST
TECHNIQUE: Multidetector CT imaging of the chest, abdomen and pelvis was
performed following the standard protocol during bolus
administration of intravenous contrast.
CONTRAST:  75mL OMNIPAQUE IOHEXOL 300 MG/ML SOLN, additional oral
enteric contrast

[Series 1: topogram 0.60 cor · coronal · 1.50mm/px · 1 of 1 slices shown]
[im 1/1]
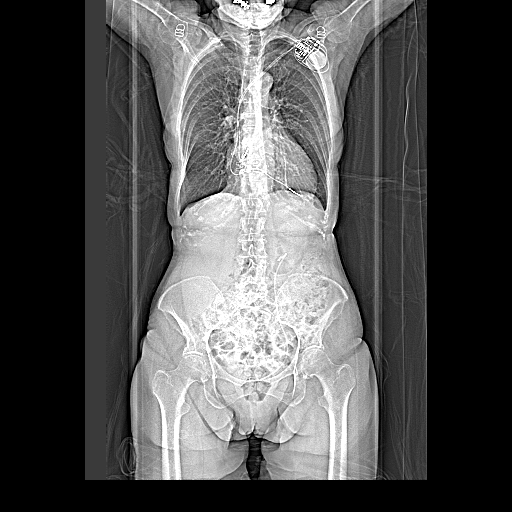

[1 of 1 positions shown; findings below may reference images not displayed]

FINDINGS: CT CHEST FINDINGS

Cardiovascular: Right chest port catheter. Left chest multi lead
pacer defibrillator. Normal heart size. Coronary artery
calcifications and/or stents. No pericardial effusion.

Mediastinum/Nodes: No enlarged mediastinal, hilar, or axillary lymph
nodes. Thyroid gland, trachea, and esophagus demonstrate no
significant findings.

Lungs/Pleura: Bandlike scarring of the bilateral lung bases. No
pleural effusion or pneumothorax.

Musculoskeletal: No chest wall mass or suspicious bone lesions
identified.

CT ABDOMEN PELVIS FINDINGS

Hepatobiliary: No solid liver abnormality is seen. No gallstones,
gallbladder wall thickening, or biliary dilatation.

Pancreas: Unremarkable. No pancreatic ductal dilatation or
surrounding inflammatory changes.

Spleen: Normal in size without significant abnormality.

Adrenals/Urinary Tract: Adrenal glands are unremarkable. Unchanged
position of a left-sided double-J ureteral stent, which is low
lying, formed pigtail within the proximal left ureter, with
unchanged moderate left hydronephrosis and proximal hydroureter. The
distal pigtail is formed in the urinary bladder. Bladder is
unremarkable. No right-sided hydronephrosis.

Stomach/Bowel: Stomach is within normal limits. Appendix appears
normal. No evidence of bowel wall thickening, distention, or
inflammatory changes. Large burden of stool throughout the colon.

Vascular/Lymphatic: No significant vascular findings are present. No
enlarged abdominal or pelvic lymph nodes.

Reproductive: Status post hysterectomy. Interval decrease in size of
a mass in the low right hemipelvis adjacent to the rectum measuring
approximately 2.3 x 1.0 cm, previously 3.4 x 1.8 cm when measured
similarly (series 2, image 92).

Other: No abdominal wall hernia or abnormality. No abdominopelvic
ascites.

Musculoskeletal: No acute or significant osseous findings.
IMPRESSION: 1. Interval decrease in size of a mass in the low right hemipelvis
adjacent to the rectum measuring approximately 2.3 x 1.0 cm,
previously 3.4 x 1.8 cm when measured similarly (series 2, image
92). Findings are consistent with treatment response.
2. No evidence of residual peritoneal or metastatic disease in the
abdomen or pelvis. No evidence of metastatic disease in the chest.
3. Stable, low lying position of left-sided ureteral stent, with
unchanged moderate left hydronephrosis and proximal hydroureter.
4. Large burden of stool throughout the colon.
5. Coronary artery disease.  Aortic Atherosclerosis ([id number]).
